# Patient Record
Sex: Male | Born: 1965 | Race: White | Hispanic: No | Marital: Married | Smoking: Never smoker
Health system: Southern US, Community
[De-identification: ages and names within clinical notes are randomized; demographics above are authoritative.]

## PROBLEM LIST (undated history)

## (undated) DIAGNOSIS — T7840XA Allergy, unspecified, initial encounter: Secondary | ICD-10-CM

## (undated) DIAGNOSIS — F32A Depression, unspecified: Secondary | ICD-10-CM

## (undated) DIAGNOSIS — F329 Major depressive disorder, single episode, unspecified: Secondary | ICD-10-CM

## (undated) DIAGNOSIS — F419 Anxiety disorder, unspecified: Secondary | ICD-10-CM

## (undated) HISTORY — DX: Depression, unspecified: F32.A

## (undated) HISTORY — DX: Allergy, unspecified, initial encounter: T78.40XA

## (undated) HISTORY — DX: Anxiety disorder, unspecified: F41.9

## (undated) HISTORY — DX: Major depressive disorder, single episode, unspecified: F32.9

---

## 2005-11-11 ENCOUNTER — Emergency Department (HOSPITAL_COMMUNITY): Admission: EM | Admit: 2005-11-11 | Discharge: 2005-11-11 | Payer: Self-pay | Admitting: Family Medicine

## 2012-07-24 ENCOUNTER — Other Ambulatory Visit (HOSPITAL_COMMUNITY): Payer: Self-pay | Admitting: Family Medicine

## 2012-07-24 ENCOUNTER — Ambulatory Visit (HOSPITAL_COMMUNITY)
Admission: RE | Admit: 2012-07-24 | Discharge: 2012-07-24 | Disposition: A | Discharge status: Home / Self Care | Payer: No Typology Code available for payment source | Source: Ambulatory Visit | Attending: Family Medicine | Admitting: Family Medicine

## 2012-07-24 DIAGNOSIS — M47812 Spondylosis without myelopathy or radiculopathy, cervical region: Secondary | ICD-10-CM | POA: Insufficient documentation

## 2012-07-24 DIAGNOSIS — M47817 Spondylosis without myelopathy or radiculopathy, lumbosacral region: Secondary | ICD-10-CM | POA: Insufficient documentation

## 2012-07-24 DIAGNOSIS — R52 Pain, unspecified: Secondary | ICD-10-CM

## 2014-05-11 ENCOUNTER — Ambulatory Visit (INDEPENDENT_AMBULATORY_CARE_PROVIDER_SITE_OTHER): Payer: BC Managed Care – PPO | Admitting: Family Medicine

## 2014-05-11 VITALS — BP 128/88 | HR 71 | Temp 98.1°F | Resp 16 | Ht 74.0 in | Wt 231.2 lb

## 2014-05-11 DIAGNOSIS — Z0000 Encounter for general adult medical examination without abnormal findings: Secondary | ICD-10-CM

## 2014-05-11 NOTE — Progress Notes (Signed)
      Chief Complaint:  Chief Complaint  Patient presents with  . Annual Exam    [employer] Physical    HPI: Jerry Thornton is a 48 y.o. male who is here for [employer] PE He is doing well without complaints Does not want any lab work  He denies CP, SOB, palpitations.   Past Medical History  Diagnosis Date  . Allergy    History reviewed. No pertinent past surgical history. History   Social History  . Marital Status: Married    Spouse Name: N/A    Number of Children: N/A  . Years of Education: N/A   Social History Main Topics  . Smoking status: Never Smoker   . Smokeless tobacco: Never Used  . Alcohol Use: Yes     Comment: 2 drinks/week  . Drug Use: No  . Sexual Activity: None   Other Topics Concern  . None   Social History Narrative  . None   Family History  Problem Relation Age of Onset  . Heart disease Paternal Grandfather   . Mental illness Brother    No Known Allergies Prior to Admission medications   Medication Sig Start Date End Date Taking? Authorizing Provider  Multiple Vitamins-Minerals (MULTIVITAMIN WITH MINERALS) tablet Take 1 tablet by mouth daily.   Yes Historical Provider, MD  Omega-3 Fatty Acids (FISH OIL PO) Take 1 capsule by mouth daily.   Yes Historical Provider, MD     ROS: The patient denies fevers, chills, night sweats, unintentional weight loss, chest pain, palpitations, wheezing, dyspnea on exertion, nausea, vomiting, abdominal pain, dysuria, hematuria, melena, numbness, weakness, or tingling.   All other systems have been reviewed and were otherwise negative with the exception of those mentioned in the HPI and as above.    PHYSICAL EXAM: Filed Vitals:   05/11/14 1135  BP: 128/88  Pulse: 71  Temp: 98.1 F (36.7 C)  Resp: 16   Filed Vitals:   05/11/14 1135  Height: 6\' 2"  (1.88 m)  Weight: 231 lb 4 oz (104.894 kg)   Body mass index is 29.68 kg/(m^2).  General: Alert, no acute distress HEENT:  Normocephalic, atraumatic,  oropharynx patent. EOMI, PERRLA, fundo exam normal Cardiovascular:  Regular rate and rhythm, no rubs murmurs or gallops.  No Carotid bruits, radial pulse intact. No pedal edema.  Respiratory: Clear to auscultation bilaterally.  No wheezes, rales, or rhonchi.  No cyanosis, no use of accessory musculature GI: No organomegaly, abdomen is soft and non-tender, positive bowel sounds.  No masses. Skin: No rashes. Neurologic: Facial musculature symmetric. Psychiatric: Patient is appropriate throughout our interaction. Lymphatic: No cervical lymphadenopathy Musculoskeletal: Gait intact. 5/5 str, 2/2 DTRs   LABS: No results found for this or any previous visit.   EKG/XRAY:   [facility]   ASSESSMENT/PLAN: Encounter Diagnosis  Name Primary?  . Annual physical exam Yes   No labs for annual, recently done somewhere else.  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 F/u prn  Gross sideeffects, risk and benefits, and alternatives of medications d/w patient. Patient is aware that all medications have potential sideeffects and we are unable to predict every sideeffect or drug-drug interaction that may occur.  Hamilton CapriLE, Starlena Beil PHUONG, DO 05/13/2014 4:47 PM

## 2015-06-09 ENCOUNTER — Ambulatory Visit (INDEPENDENT_AMBULATORY_CARE_PROVIDER_SITE_OTHER): Payer: BLUE CROSS/BLUE SHIELD | Admitting: Sports Medicine

## 2015-06-09 ENCOUNTER — Encounter: Payer: Self-pay | Admitting: Sports Medicine

## 2015-06-09 VITALS — BP 135/75 | HR 74 | Ht 74.0 in | Wt 215.0 lb

## 2015-06-09 DIAGNOSIS — M5010 Cervical disc disorder with radiculopathy, unspecified cervical region: Secondary | ICD-10-CM | POA: Diagnosis not present

## 2015-06-09 NOTE — Progress Notes (Signed)
Patient ID: Jerry Thornton, male   DOB: 1966/06/14, 49 y.o.   MRN: [id number]  HPI:  [name] is a 49 yo otherwise healthy man presenting with a 1 week history of sharp, shooting right arm pain worse with lateral rotation of the neck. He has accompanying paresthesias in the R volar aspect of the forearm that radiates to his 4th and 5th phalanges.  Pain/paresthesias are worse when lying flat on his back and relieved with standing. Sleep has been difficult due to discomfort. Ibuprofen/acetaminophen and heat/massage improve his symptoms, also noting that his symptoms have gotten better over the week-long period.  Raising his R arm also relieves his symptoms.  Endorses some weakness in the R forearm and grip strength of 4th and 5th phalanges.  Has had similar back pain before (x2), but not with parathesias/weakness.  Left hand dominant.  Denies recent trauma. Denies vision changes, bowel/bladder incontinence.   Also endorses lower back pain that he attributes to his posture, as his job is mostly sedentary.  Endorses paresthesias in feet bilaterally when lying flat on back and relieved with standing.   Filed Vitals:   06/09/15 0958  BP: 135/75  Pulse: 74   Ortho Exam /Physical Exam:  General: Well-appearing male, appears uncomfortable while sitting upright on exam table, often shifting weight/standing.  Cervical spine: Full ROM, guards with lateral rotation. No pinpoint tenderness over vertebrae. CN XI/XII intact. No reproducible pain on exam, negative Spurlings (although suspect would be positive with further provocation).   Thoracic spine: Tenderness to palpation over upper-thoracic spine over spinous process.  Extremities: Normal tone and bulk in upper and lower extremities. Upper: 5/5 strength of shoulders, biceps, wrist flexors/extenors, intrinsic hand muscles. Bicep, brachioradialis reflexes 2+ and symmetrical.  L tricep 2+ reflex, R tricep 1+.  Slightly diminished sensation to light touch on  4th and 5th finger or dorsal and palmar aspect.  No muscle wasting in hypothenar regions bilaterally. Lower: 5/5 strength in quadriceps, tibialis anterior, and gastrocnemis/soleus. 2+ and symmetrical reflexes in patellar and Achilles reflexes.  Assessment:   [name] is a 49 yo otherwise healthy man presenting with symptoms most consistent with right-sided C8-T1 radiculopathy, likely 2/2 disc herniation as supported by shoulder abduction relief test, Spurlings (did not do provacative testing due to patient's discomfort), improvement of symptoms with time, and referred pain to upper thoracic region. Other differential items considered were cervical spondylotic myelopathy (currently no imaging, but history of C5-C6 facet narrowing in 2013), discogenic pain (pain is axial, but does have radicular component).   Plan:  - Conservative therapy with NSAIDs - ibuprofen 600 mg TID with food. Provided patient with information for [facility], as we feel that PT would be beneficial to his current symptoms as well as preventative.  - Discussed with patient would forgo Xray/MRI at this point due to improvement in symptoms over the week, but would consider if symptoms acutely worsen or parasthesias/weakness in R ulnar distribution do not abate.  - Will follow-up in 2 weeks or in the interim if worsening of symptoms.  Patient seen and evaluated with the medical student. I agree with the above dictated note. Patient will be referred for physical therapy and follow-up with me in 2 weeks. If symptoms persist or worsen consider x-rays and MRI to rule out cervical disc herniation. Patient is to call me if pain worsens in the interim.

## 2015-06-24 ENCOUNTER — Ambulatory Visit (INDEPENDENT_AMBULATORY_CARE_PROVIDER_SITE_OTHER): Payer: BLUE CROSS/BLUE SHIELD | Admitting: Sports Medicine

## 2015-06-24 ENCOUNTER — Encounter: Payer: Self-pay | Admitting: Sports Medicine

## 2015-06-24 VITALS — BP 110/70 | Ht 74.0 in | Wt 210.0 lb

## 2015-06-24 DIAGNOSIS — M5010 Cervical disc disorder with radiculopathy, unspecified cervical region: Secondary | ICD-10-CM

## 2015-06-24 NOTE — Progress Notes (Signed)
Patient ID: Trever Streater, male   DOB: October 03, 1966, 49 y.o.   MRN: [id number]  HPI: Ples Trudel is a 49 year old male presenting for 2 week follow-up for left-sided C8-T1 radicular symptoms which have now resolved. He has had two PT appointments with [facility] at which he has been doing scapular stretches and other lower body strengthening.  Since starting PT, he has ~2 episodes of radicular symptoms, that he was able "to shake out" with resolution.  Endorses decreased sensation over the 4th and 5th digits on the L hand at baseline and unchanged from prior visit.  Denies weakness.  Denies neck pain.  Endorses minimal L trapezius stiffness after starting PT.  Only taking ibuprofen PRN.  L hand dominant.   Physical Exam:  General: Well-appearing middle-aged male sitting comfortably on exam table in no apparent distress.  Cervical spine: Full ROM. No pinpoint tenderness over vertebrae. Negative Spurlings.   Thoracic spine: Tenderness to palpation over upper-thoracic spine over spinous process.  Extremities: Normal tone and bulk in upper extremities. Tender to palpation in superior L trapezius with palpable spasm.  5/5 strength of shoulders, biceps, wrist flexors/extenors, intrinsic hand muscles. Bicep, brachioradialis, tricep reflexes 2+ and symmetrical. Slightly diminished sensation to light touch on 4th and 5th finger or dorsal and palmar aspect, but stable from prior visit. No muscle wasting in hypothenar regions bilaterally.  Assessment/Plan:  Mr. Meriwether is a 49 year old man presenting for 2 week follow-up for left-sided C8-T1 radiculopathy which has now resolved, with an exam only notable for spasm of the L superior trapezius and mildly diminished sensation in the C8/ulnar distribution (baseline).  His symptoms were thought to be 2/2 disc herniation as supported by his previously positive shoulder abduction relief test, Spurlings (negative today), and referred pain in upper thoracic region  (still present). His improvement with PT is reassuring and supportive of the above diagnostic suspicion.  Degenerative cervical changes could also be contributory, but have not obtained imaging.  - Will forgo further imaging at this point, as no red flags present; discussed to return if experiencing weakness or worsening paraesthesias. - Suspect his radiculopathy, recent PT activity, and posture are contributory to his trapezius spasm, discussed focused PT HEP - Continue PT - Ibuprofen PRN - Follow-up PRN  Fontaine No, MS4  Patient seen and evaluated with the above-named medical student. I agree with the plan of care. Patient seems to be improving with physical therapy. Therefore, I will hold on any further imaging at this point in time. He will wean from formal therapy to a home exercise program per the therapist's discretion. If symptoms return I would reconsider merits of imaging to rule out a cervical disc herniation. Follow-up as needed.

## 2016-04-26 ENCOUNTER — Ambulatory Visit (INDEPENDENT_AMBULATORY_CARE_PROVIDER_SITE_OTHER): Payer: BLUE CROSS/BLUE SHIELD | Admitting: Physician Assistant

## 2016-04-26 VITALS — BP 136/92 | HR 65 | Temp 98.5°F | Resp 17 | Ht 74.0 in | Wt 236.0 lb

## 2016-04-26 DIAGNOSIS — H60392 Other infective otitis externa, left ear: Secondary | ICD-10-CM | POA: Diagnosis not present

## 2016-04-26 MED ORDER — CIPROFLOXACIN-HYDROCORTISONE 0.2-1 % OT SUSP: 3.0000 [drp] | Freq: Two times a day (BID) | OTIC | Status: DC

## 2016-04-26 NOTE — Patient Instructions (Addendum)
     IF you received an x-ray today, you will receive an invoice from [facility]. Please contact [facility] at [phone] with questions or concerns regarding your invoice.   IF you received labwork today, you will receive an invoice from [facility]. Please contact [facility] at [phone] with questions or concerns regarding your invoice.   Our billing staff will not be able to assist you with questions regarding bills from these companies.  You will be contacted with the lab results as soon as they are available. The fastest way to get your results is to activate your My Chart account. Instructions are located on the last page of this paperwork. If you have not heard from us regarding the results in 2 weeks, please contact this office.     Otitis Externa Otitis externa is a germ infection in the outer ear. The outer ear is the area from the eardrum to the outside of the ear. Otitis externa is sometimes called "swimmer's ear." HOME CARE  Put drops in the ear as told by your doctor.  Only take medicine as told by your doctor.  If you have diabetes, your doctor may give you more directions. Follow your doctor's directions.  Keep all doctor visits as told. To avoid another infection:  Keep your ear dry. Use the corner of a towel to dry your ear after swimming or bathing.  Avoid scratching or putting things inside your ear.  Avoid swimming in lakes, dirty water, or pools that use a chemical called chlorine poorly.  You may use ear drops after swimming. Combine equal amounts of white vinegar and alcohol in a bottle. Put 3 or 4 drops in each ear. GET HELP IF:   You have a fever.  Your ear is still red, puffy (swollen), or painful after 3 days.  You still have yellowish-white fluid (pus) coming from the ear after 3 days.  Your redness, puffiness, or pain gets worse.  You have a really bad headache.  You have redness, puffiness,  pain, or tenderness behind your ear. MAKE SURE YOU:   Understand these instructions.  Will watch your condition.  Will get help right away if you are not doing well or get worse.   This information is not intended to replace advice given to you by your health care provider. Make sure you discuss any questions you have with your health care provider.   Document Released: 03/22/2008 Document Revised: 10/25/2014 Document Reviewed: 10/21/2011 Elsevier Interactive Patient Education 2016 Elsevier Inc.  

## 2016-04-27 ENCOUNTER — Telehealth: Payer: Self-pay

## 2016-04-27 DIAGNOSIS — H60392 Other infective otitis externa, left ear: Secondary | ICD-10-CM

## 2016-04-27 NOTE — Telephone Encounter (Signed)
Patient is requesting a generic ear drop.   $300 for the name brand  [facility] [address]

## 2016-04-30 MED ORDER — CIPROFLOXACIN-HYDROCORTISONE 0.2-1 % OT SUSP: 3.0000 [drp] | Freq: Two times a day (BID) | OTIC | Status: AC

## 2016-04-30 NOTE — Telephone Encounter (Signed)
[facility]  Please advise patient.  They should let me know soon, if this is too expensive for him, (or he should).

## 2016-05-05 NOTE — Telephone Encounter (Signed)
Called phar to make sure pt had been able to get this med. Pharm reported that the pt just paid for the first one sent in the evening of 7/11 so that he would have the medication to use.

## 2016-05-06 NOTE — Progress Notes (Signed)
[facility] [address], [place] [id number] [phone]  Date:  04/26/2016   Name:  Jerry Oharalexander Savas   DOB:  03/10/1966   MRN:  [id number]  PCP:  Sissy HoffSWAYNE,DAVID W, MD    History of Present Illness:  Jerry Thornton is a 50 y.o. male patient who [facility] of [facility] ear pain. -started 4 days ago with increasingly worsening symptoms.  No radiating pain.  No fever, nausea, or malaise.    There are no active problems to display for this patient.   Past Medical History  Diagnosis Date  . Allergy   . Depression     No past surgical history on file.  Social History  Substance Use Topics  . Smoking status: Never Smoker   . Smokeless tobacco: Never Used  . Alcohol Use: Yes     Comment: 2 drinks/week    Family History  Problem Relation Age of Onset  . Heart disease Paternal Grandfather   . Mental illness Brother     No Known Allergies  Medication list has been reviewed and updated.  Current Outpatient Prescriptions on File Prior to Visit  Medication Sig Dispense Refill  . ibuprofen (ADVIL,MOTRIN) 200 MG tablet Take 200 mg by mouth every 6 (six) hours as needed. Reported on 04/26/2016    . Multiple Vitamins-Minerals (MULTIVITAMIN WITH MINERALS) tablet Take 1 tablet by mouth daily. Reported on 04/26/2016    . Omega-3 Fatty Acids (FISH OIL PO) Take 1 capsule by mouth daily. Reported on 04/26/2016     No current facility-administered medications on file prior to visit.    ROS ROS otherwise unremarkable unless listed above.  Physical Examination: BP 136/92 mmHg  Pulse 65  Temp(Src) 98.5 F (36.9 C) (Oral)  Resp 17  Ht 6\' 2"  (1.88 m)  Wt 236 lb (107.049 kg)  BMI 30.29 kg/m2  SpO2 97% Ideal Body Weight: Weight in (lb) to have BMI = 25: 194.3  Physical Exam  Constitutional: He is oriented to person, place, and time. He appears well-developed and well-nourished. No distress.  HENT:  Head: Normocephalic and atraumatic.  Right Ear:  Tympanic membrane, external ear and ear canal normal.  Left Ear: Tympanic membrane and external ear normal.  Left with purulent drainage.  And edematous canal.  No erythema. Pain with pulling the left ear.    Eyes: Conjunctivae and EOM are normal. Pupils are equal, round, and reactive to light.  Cardiovascular: Normal rate.   Pulmonary/Chest: Effort normal. No respiratory distress.  Neurological: He is alert and oriented to person, place, and time.  Skin: Skin is warm and dry. He is not diaphoretic.  Psychiatric: He has a normal mood and affect. His behavior is normal.     Assessment and Plan: Jerry Oharalexander Balistreri is a 50 y.o. male who is here today for cc of left ear pain.  Otitis, externa, infective, left - Plan: DISCONTINUED: ciprofloxacin-hydrocortisone (CIPRO HC) otic suspension  Trena PlattStephanie Ulyana Pitones, PA-C [facility] 7/20/20175:56 PM

## 2016-07-14 DIAGNOSIS — Z23 Encounter for immunization: Secondary | ICD-10-CM | POA: Diagnosis not present

## 2016-08-23 DIAGNOSIS — M25511 Pain in right shoulder: Secondary | ICD-10-CM | POA: Diagnosis not present

## 2016-10-02 ENCOUNTER — Emergency Department (HOSPITAL_COMMUNITY)
Admission: EM | Admit: 2016-10-02 | Discharge: 2016-10-03 | Disposition: A | Discharge status: Home / Self Care | Payer: BLUE CROSS/BLUE SHIELD | Attending: Emergency Medicine | Admitting: Emergency Medicine

## 2016-10-02 ENCOUNTER — Encounter (HOSPITAL_COMMUNITY): Payer: Self-pay | Admitting: Emergency Medicine

## 2016-10-02 DIAGNOSIS — F329 Major depressive disorder, single episode, unspecified: Secondary | ICD-10-CM

## 2016-10-02 DIAGNOSIS — F339 Major depressive disorder, recurrent, unspecified: Secondary | ICD-10-CM

## 2016-10-02 DIAGNOSIS — Z79899 Other long term (current) drug therapy: Secondary | ICD-10-CM | POA: Insufficient documentation

## 2016-10-02 DIAGNOSIS — F411 Generalized anxiety disorder: Secondary | ICD-10-CM

## 2016-10-02 LAB — COMPREHENSIVE METABOLIC PANEL
ALK PHOS: 53 U/L (ref 38–126)
ALT: 42 U/L (ref 17–63)
AST: 34 U/L (ref 15–41)
Albumin: 5 g/dL (ref 3.5–5.0)
Anion gap: 11 (ref 5–15)
BILIRUBIN TOTAL: 1.1 mg/dL (ref 0.3–1.2)
BUN: 13 mg/dL (ref 6–20)
CALCIUM: 9.4 mg/dL (ref 8.9–10.3)
CO2: 25 mmol/L (ref 22–32)
Chloride: 103 mmol/L (ref 101–111)
Creatinine, Ser: 0.93 mg/dL (ref 0.61–1.24)
GFR calc Af Amer: >60 mL/min (ref >60)
Glucose, Bld: 92 mg/dL (ref 65–99)
POTASSIUM: 4.3 mmol/L (ref 3.5–5.1)
Sodium: 139 mmol/L (ref 135–145)
TOTAL PROTEIN: 7.8 g/dL (ref 6.5–8.1)

## 2016-10-02 LAB — CBC WITH DIFFERENTIAL/PLATELET
BASOS ABS: 0 10*3/uL (ref 0.0–0.1)
BASOS PCT: 0 %
EOS ABS: 0 10*3/uL (ref 0.0–0.7)
EOS PCT: 0 %
HEMATOCRIT: 43.4 % (ref 39.0–52.0)
Hemoglobin: 15.4 g/dL (ref 13.0–17.0)
Lymphocytes Relative: 28 %
Lymphs Abs: 2.5 10*3/uL (ref 0.7–4.0)
MCH: 29.8 pg (ref 26.0–34.0)
MCHC: 35.5 g/dL (ref 30.0–36.0)
MCV: 84.1 fL (ref 78.0–100.0)
MONO ABS: 0.5 10*3/uL (ref 0.1–1.0)
Monocytes Relative: 6 %
NEUTROS ABS: 5.8 10*3/uL (ref 1.7–7.7)
Neutrophils Relative %: 66 %
PLATELETS: 289 10*3/uL (ref 150–400)
RBC: 5.16 MIL/uL (ref 4.22–5.81)
RDW: 12.6 % (ref 11.5–15.5)
WBC: 8.8 10*3/uL (ref 4.0–10.5)

## 2016-10-02 LAB — RAPID URINE DRUG SCREEN, HOSP PERFORMED
AMPHETAMINES: NOT DETECTED
Barbiturates: NOT DETECTED
Benzodiazepines: NOT DETECTED
COCAINE: NOT DETECTED
OPIATES: NOT DETECTED
TETRAHYDROCANNABINOL: NOT DETECTED

## 2016-10-02 LAB — URINALYSIS, ROUTINE W REFLEX MICROSCOPIC
Bacteria, UA: NONE SEEN
Bilirubin Urine: NEGATIVE
Glucose, UA: NEGATIVE mg/dL
Hgb urine dipstick: NEGATIVE
Ketones, ur: 5 mg/dL — AB
Leukocytes, UA: NEGATIVE
Nitrite: NEGATIVE
Protein, ur: 30 mg/dL — AB
RBC / HPF: NONE SEEN RBC/hpf (ref 0–5)
SPECIFIC GRAVITY, URINE: 1.027 (ref 1.005–1.030)
Squamous Epithelial / LPF: NONE SEEN
pH: 7 (ref 5.0–8.0)

## 2016-10-02 LAB — ETHANOL

## 2016-10-02 LAB — ACETAMINOPHEN LEVEL

## 2016-10-02 LAB — SALICYLATE LEVEL

## 2016-10-02 MED ORDER — ZIPRASIDONE MESYLATE 20 MG IM SOLR
INTRAMUSCULAR | Status: AC
  Filled 2016-10-02: qty 20

## 2016-10-02 MED ORDER — ZIPRASIDONE MESYLATE 20 MG IM SOLR
10.0000 mg | Freq: Once | INTRAMUSCULAR | Status: AC
  Administered 2016-10-02: 10 mg via INTRAMUSCULAR

## 2016-10-02 MED ORDER — STERILE WATER FOR INJECTION IJ SOLN
INTRAMUSCULAR | Status: AC
  Filled 2016-10-02: qty 10

## 2016-10-02 MED ORDER — LORAZEPAM 2 MG/ML IJ SOLN
1.0000 mg | Freq: Once | INTRAMUSCULAR | Status: AC
  Administered 2016-10-02: 1 mg via INTRAMUSCULAR

## 2016-10-02 MED ORDER — LORAZEPAM 2 MG/ML IJ SOLN
INTRAMUSCULAR | Status: AC
  Filled 2016-10-02: qty 1

## 2016-10-02 NOTE — ED Provider Notes (Signed)
Patient signed out from off going emergency physician pending TTS evaluation for depression. Patient expressed no suicidality and relayed the patient was voluntary. TTS evaluated patient and recommended psychiatry evaluation in the morning. Around 7:30 PM patient decided that he was not willing to stay in the emergency department. Left the building and was noted by staff chasing nurses in the parking lot. Off duty police officer found the patient sitting in the road. Patient was attempting to go to police officers into shooting him in the head. When brought inside patient refused to go into his room. Stating, " let's do this and the open, right here" while pointing at his forehead. Patient appeared very agitated with pressured speech. IVC paperwork was initiated and chemical sedatives given.    Loren Raceravid Joshaua Epple, MD 10/02/16 [phone]

## 2016-10-02 NOTE — ED Notes (Signed)
Hourly rounding reveals patient sleeping in room. No complaints, stable, in no acute distress. Q15 minute rounds and monitoring via Security Cameras to continue. 

## 2016-10-02 NOTE — ED Notes (Signed)
Pt seen by security chasing off going nurses around the parking lot.  Pt was trying to get off duty officer to shoot him.  Dr. Ranae PalmsYelverton made aware and has taken out emergent IVC paperwork.

## 2016-10-02 NOTE — ED Notes (Signed)
Per Dr Fredderick PhenixBelfi - hold off on doing lab work.

## 2016-10-02 NOTE — BH Assessment (Addendum)
Rutha BouchardAC JoAnn, RN reports no appropriate OBS beds are available for the pt. Advised RN that the pt will need an AM psych.  Princess BruinsAquicha Duff, MSW, LCSW-A

## 2016-10-02 NOTE — BH Assessment (Signed)
Tele Assessment Note   Jerry Thornton is a 50 y.o. male who [facility] a [facility] complaint of depressive symptoms and rapid mood changes.  He was accompanied by his wife, who requested the assessment.  Pt's wife expressed concern that Pt is behaving strangely and may be heading toward a psychotic break.  Wife reported as follows:  She stated that 20 years ago, Pt experienced a brief psychotic episode for which he was hospitalized.  He had another 'episode' about 12 years ago.  At that time, he did not go to the hospital because he was under psychiatric care ([place]  Wife reported that over the last few days, Pt has 'not been himself' -- he sometimes cries, acts confused, forgets words, and then is calm/composed again.  Wife expressed concern because Pt's family has a history of depression and anxiety, and his brother committed suicide two years ago.  Wife said she suspects that Pt is stressed over work.  Pt himself denied any suicidal or homicidal ideation, auditory/visual hallucination, substance use, or self-injury.  Pt seemed very guarded and referred several questions such as "What do you want to see done today?" to wife.  Pt expressed a desire to go home, stating that he felt safe.  Wife stated that she feels 'mostly' safe with Pt at home, but she is concerned about his mood.   After assessing Pt, Thereasa Parkinauthor was advised that hospital staff believed Pt was paranoid because of strange comments he made during blood draw, to the effect of "You better get that blood."  Per wife, Pt is not under any psychiatric care currently.  Also per wife, [facility]  Pt was dressed in street clothes and appeared appropriately groomed.  He had fair eye contact -- he made eye contact with author, but stared at wife and referred questions to her.  Pt's mood was empty, and affect was flat.  Pt's demeanor was calm.  He denied  suicidal ideation, homicidal ideation, auditory/visual hallucination, and substance use.  He denied other depressive symptoms as well, although (per wife), he has episodes of sadness and tearfulness.  Pt's speech was spare, soft, and regular in rhythm and rate.  Thought processes were within normal range.  Thought content seemed logical and goal-oriented.  He responded to questions appropriately, often referring to wife.  Pt's memory and concentration were grossly intact.  Pt's insight, judgment, and impulse control were deemed fair.  Consulted with C Withrow, DNP, who determined that Pt does not meet inpatient criteria at this time.  Recommended that Pt [facility].  Advised attending physician, and recommend that if Pt's condition worsen or grow severe that Pt return to ED/[facility] for assessment.  Diagnosis: Major Depressive Disorder, Recurrent, Moderate  Past Medical History:  Past Medical History:  Diagnosis Date  . Allergy   . Depression     History reviewed. No pertinent surgical history.  Family History:  Family History  Problem Relation Age of Onset  . Heart disease Paternal Grandfather   . Mental illness Brother     Social History:  reports that he has never smoked. He has never used smokeless tobacco. He reports that he drinks alcohol. He reports that he does not use drugs.  Additional Social History:  Alcohol / Drug Use Pain Medications: See PTA Prescriptions: See PTA Over the Counter: See PTA History of alcohol / drug use?: No history  of alcohol / drug abuse  CIWA: CIWA-Ar BP: (!) 164/106 Pulse Rate: 120 COWS:    PATIENT STRENGTHS: (choose at least two) Average or above average intelligence Capable of independent living  Allergies: No Known Allergies  Home Medications:  (Not in a hospital admission)  OB/GYN Status:  No LMP for male patient.  General Assessment Data [facility] of Assessment: [facility] TTS Assessment: In system Is  this a Tele or Face-to-Face Assessment?: Tele Assessment Is this an Initial Assessment or a Re-assessment for this encounter?: Initial Assessment Marital status: Married Living Arrangements: Children, Spouse/significant other Can pt return to current living arrangement?: Yes Admission Status: Voluntary Is patient capable of signing voluntary admission?: Yes Referral Source: Self/Family/Friend Insurance type: BCBS     Crisis Care Plan Living Arrangements: Children, Spouse/significant other Name of Psychiatrist: None currently Name of Therapist: None currently  Education Status Is patient currently in school?: No  Risk to self with the past 6 months Suicidal Ideation: No Has patient been a risk to self within the past 6 months prior to admission? : No Suicidal Intent: No Has patient had any suicidal intent within the past 6 months prior to admission? : No Is patient at risk for suicide?: No Suicidal Plan?: No Has patient had any suicidal plan within the past 6 months prior to admission? : No Access to Means: No What has been your use of drugs/alcohol within the last 12 months?: Pt denied Previous Attempts/Gestures: No Intentional Self Injurious Behavior: None Family Suicide History: Yes (Brother committed suicide two years ago) Recent stressful life event(s): Other (Comment) (None indentified -- possibly work stressor) Persecutory voices/beliefs?: No Depression: Yes Depression Symptoms: Despondent, Isolating Substance abuse history and/or treatment for substance abuse?: No Suicide prevention information given to non-admitted patients: Not applicable  Risk to Others within the past 6 months Homicidal Ideation: No Does patient have any lifetime risk of violence toward others beyond the six months prior to admission? : No Thoughts of Harm to Others: No Current Homicidal Intent: No Current Homicidal Plan: No Access to Homicidal Means: No History of harm to others?:  No Assessment of Violence: None Noted Does patient have access to weapons?: No Criminal Charges Pending?: No Does patient have a court date: No Is patient on probation?: No  Psychosis Hallucinations: None noted Delusions: None noted (However, per hospital staff, Pt seemed somewhat paranoid)  Mental Status Report Appearance/Hygiene: Unremarkable, Other (Comment) (Street clothes) Eye Contact: Fair (Alternated b/n looking at Chartered loss adjuster and wife) Motor Activity: Unremarkable Speech: Logical/coherent, Other (Comment) (Short answers; very guarded demeanor) Level of Consciousness: Irritable Mood: Empty Affect: Flat Anxiety Level: None Thought Processes: Coherent, Relevant (Responded appropriately to questions posed) Judgement: Partial Orientation: Person, Place, Time, Situation Obsessive Compulsive Thoughts/Behaviors: None  Cognitive Functioning Concentration: Fair Memory: Recent Intact, Remote Intact IQ: Average Insight: Fair Impulse Control: Fair Appetite: Good Sleep: No Change Total Hours of Sleep: 8 Vegetative Symptoms: None  ADLScreening [facility]) Patient's cognitive ability adequate to safely complete daily activities?: Yes Patient able to express need for assistance with ADLs?: Yes Independently performs ADLs?: Yes (appropriate for developmental age)  Prior Inpatient Therapy Prior Inpatient Therapy: Yes Prior Therapy Dates: Approximately 1997 Prior Therapy Facilty/Provider(s): unknown Reason for Treatment: Brief psychotic disorder  Prior Outpatient Therapy Prior Outpatient Therapy: Yes Prior Therapy Dates: 2002 Prior Therapy Facilty/Provider(s): psychiatrist in [place] Reason for Treatment: Depressive symptoms Does patient have an ACCT team?: No Does patient have Intensive In-House Services?  : No Does patient have [facility] services? :  No Does patient have P4CC services?: No  ADL Screening (condition at time of admission) Patient's cognitive  ability adequate to safely complete daily activities?: Yes Is the patient deaf or have difficulty hearing?: No Does the patient have difficulty seeing, even when wearing glasses/contacts?: No Does the patient have difficulty concentrating, remembering, or making decisions?: No Patient able to express need for assistance with ADLs?: Yes Does the patient have difficulty dressing or bathing?: No Independently performs ADLs?: Yes (appropriate for developmental age) Does the patient have difficulty walking or climbing stairs?: No Weakness of Legs: None Weakness of Arms/Hands: None  Home Assistive Devices/Equipment Home Assistive Devices/Equipment: None  Therapy Consults (therapy consults require a physician order) PT Evaluation Needed: No OT Evalulation Needed: No SLP Evaluation Needed: No Abuse/Neglect Assessment (Assessment to be complete while patient is alone) Physical Abuse: Denies Verbal Abuse: Denies Sexual Abuse: Denies Exploitation of patient/patient's resources: Denies Self-Neglect: Denies Values / Beliefs Cultural Requests During Hospitalization: None Spiritual Requests During Hospitalization: None Consults Spiritual Care Consult Needed: No Social Work Consult Needed: No Merchant navy officerAdvance Directives (For Healthcare) Does Patient Have a Medical Advance Directive?: No    Additional Information 1:1 In Past 12 Months?: No CIRT Risk: No Elopement Risk: No Does patient have medical clearance?: Yes     Disposition:  Disposition Initial Assessment Completed for this Encounter: Yes Disposition of Patient: Referred to Patient referred to: Outpatient clinic referral ([facility] Pt has appointment coming Thursday)  Dorris Fetchugene T Jauan Wohl 10/02/2016 5:21 PM

## 2016-10-02 NOTE — ED Notes (Signed)
Pt. Transferred to [facility] from ED to room after screening for contraband. Report to include Situation, Background, Assessment and Recommendations from [facility] RN. Pt. Oriented to unit including Q15 minute rounds as well as the security cameras for their protection. Patient is alert and oriented, warm and dry in no acute distress. Patient denies SI, HI, and AVH. Pt. Encouraged to let me know if needs arise.

## 2016-10-02 NOTE — ED Notes (Addendum)
Attempted to collect labs-patient appears to be paranoid, guarded-blunted affect-needing reassurance from wife-denies Si/HI-MD aware

## 2016-10-02 NOTE — ED Notes (Signed)
Tele-psych at bedside.

## 2016-10-02 NOTE — ED Triage Notes (Addendum)
Pt presents with wife voluntarily to please wife. Pt not wanting to speak but is answering questions. Wife states that pt is not acting himself this past week. Wife says pt has had 2 mental break downs, one 20 years ago and another about 12 years ago and has been fine not needing any medications since. Pt denies feeling stressed or any SI/HI thoughts.

## 2016-10-02 NOTE — ED Provider Notes (Signed)
[facility]-EMERGENCY DEPT Provider Note   CSN: [id number] Arrival date & time: 10/02/16  1524     History   Chief Complaint Chief Complaint  Patient presents with  . Psychiatric Evaluation    HPI Jerry Thornton is a 50 y.o. male.  Patient is a 50 year old male with a history of depression who presents with worsening depression and bizarre behavior. His wife states over last 2 weeks she's had worsening depression, with crying fits and bizarre behavior. He has episodes where he is crying and can't organize his thoughts. His symptoms seem to be worsening over the last 2 days. He denies any suicidal ideations. He has a prior history of similar episodes in the past where he was hospitalized for his depression. He denies any recent illnesses. No current physical complaints. He does have a strong family history of depression and anxiety. His brother committed suicide 2 years ago.      Past Medical History:  Diagnosis Date  . Allergy   . Depression     There are no active problems to display for this patient.   History reviewed. No pertinent surgical history.     Home Medications    Prior to Admission medications   Medication Sig Start Date End Date Taking? Authorizing Provider  ibuprofen (ADVIL,MOTRIN) 200 MG tablet Take 200 mg by mouth every 6 (six) hours as needed. Reported on 04/26/2016    Historical Provider, MD  Multiple Vitamins-Minerals (MULTIVITAMIN WITH MINERALS) tablet Take 1 tablet by mouth daily. Reported on 04/26/2016    Historical Provider, MD  Omega-3 Fatty Acids (FISH OIL PO) Take 1 capsule by mouth daily. Reported on 04/26/2016    Historical Provider, MD    Family History Family History  Problem Relation Age of Onset  . Heart disease Paternal Grandfather   . Mental illness Brother     Social History Social History  Substance Use Topics  . Smoking status: Never Smoker  . Smokeless tobacco: Never Used  . Alcohol use Yes     Comment: 2 drinks/week      Allergies   Patient has no known allergies.   Review of Systems Review of Systems  Constitutional: Negative for chills, diaphoresis, fatigue and fever.  HENT: Negative for congestion, rhinorrhea and sneezing.   Eyes: Negative.   Respiratory: Negative for cough, chest tightness and shortness of breath.   Cardiovascular: Negative for chest pain and leg swelling.  Gastrointestinal: Negative for abdominal pain, blood in stool, diarrhea, nausea and vomiting.  Genitourinary: Negative for difficulty urinating, flank pain, frequency and hematuria.  Musculoskeletal: Negative for arthralgias and back pain.  Skin: Negative for rash.  Neurological: Negative for dizziness, speech difficulty, weakness, numbness and headaches.  Psychiatric/Behavioral: Positive for dysphoric mood. Negative for suicidal ideas. The patient is nervous/anxious.      Physical Exam Updated Vital Signs BP 114/69 (BP Location: Right Arm)   Pulse 94   Temp 98.4 F (36.9 C) (Oral)   Resp 18   Ht 6\' 2"  (1.88 m)   Wt 214 lb (97.1 kg)   SpO2 95%   BMI 27.48 kg/m   Physical Exam  Constitutional: He is oriented to person, place, and time. He appears well-developed and well-nourished.  HENT:  Head: Normocephalic and atraumatic.  Eyes: Pupils are equal, round, and reactive to light.  Neck: Normal range of motion. Neck supple.  Cardiovascular: Normal rate, regular rhythm and normal heart sounds.   Pulmonary/Chest: Effort normal and breath sounds normal. No respiratory distress. He has no wheezes.  He has no rales. He exhibits no tenderness.  Abdominal: Soft. Bowel sounds are normal. There is no tenderness. There is no rebound and no guarding.  Musculoskeletal: Normal range of motion. He exhibits no edema.  Lymphadenopathy:    He has no cervical adenopathy.  Neurological: He is alert and oriented to person, place, and time.  Motor 5/5 all extremities Sensation grossly intact to LT all extremities Finger to Nose  intact, no pronator drift CN II-XII grossly intact Gait normal   Skin: Skin is warm and dry. No rash noted.  Psychiatric: His affect is blunt.     ED Treatments / Results  Labs (all labs ordered are listed, but only abnormal results are displayed) Labs Reviewed  URINALYSIS, ROUTINE W REFLEX MICROSCOPIC - Abnormal; Notable for the following:       Result Value   Color, Urine AMBER (*)    APPearance HAZY (*)    Ketones, ur 5 (*)    Protein, ur 30 (*)    All other components within normal limits  ACETAMINOPHEN LEVEL - Abnormal; Notable for the following:    Acetaminophen (Tylenol), Serum <10 (*)    All other components within normal limits  RAPID URINE DRUG SCREEN, HOSP PERFORMED  COMPREHENSIVE METABOLIC PANEL  ETHANOL  CBC WITH DIFFERENTIAL/PLATELET  SALICYLATE LEVEL    EKG  EKG Interpretation None       Radiology No results found.  Procedures Procedures (including critical care time)  Medications Ordered in ED Medications  ziprasidone (GEODON) injection 10 mg (10 mg Intramuscular Given 10/02/16 1945)  LORazepam (ATIVAN) injection 1 mg (1 mg Intramuscular Given 10/02/16 1945)  LORazepam (ATIVAN) injection 1 mg (1 mg Intramuscular Given 10/03/16 0456)  ziprasidone (GEODON) injection 10 mg (10 mg Intramuscular Given 10/03/16 0457)  sterile water (preservative free) injection (10 mLs  Given 10/03/16 0456)     Initial Impression / Assessment and Plan / ED Course  I have reviewed the triage vital signs and the nursing notes.  Pertinent labs & imaging results that were available during my care of the patient were reviewed by me and considered in my medical decision making (see chart for details).  Clinical Course     PT had a telepsych Evaluation to felt that the patient did not meet criteria for inpatient admission. His wife is concerned about him being discharge. However he does have an appointment with behavioral health on Thursday. Currently there is a  [facility] counselor doing an in person assessment.  We'll await assessment.  Dr. Ranae PalmsYelverton to follow.    Final Clinical Impressions(s) / ED Diagnoses   Final diagnoses:  None    New Prescriptions New Prescriptions   No medications on file     Rolan BuccoMelanie Elva Breaker, MD 10/03/16 [id number]

## 2016-10-02 NOTE — ED Notes (Signed)
Pt stated "Im not going to stay in this mental institution, im putting my clothes on and walking out that front door". Pt asked to calm down, and advised to stay for further evaluation. Pt denied and escalated in aggression. Jerilee HohStacy West, RN spoke with MD and he is aware of situation. Currently no IVC papers are had on this patient.

## 2016-10-02 NOTE — BH Assessment (Signed)
Per Jerry Raceravid Yelverton, MD around 7:30 PM patient decided that he was not willing to stay in the emergency department. Left the building and was noted by staff that he was chasing nurses in the parking lot. Off duty police officer found the patient sitting in the road. Patient was attempting to get the police officers to shoot him in the head. When brought inside patient refused to go into his room. Stating, " let's do this and the open, right here" while pointing at his forehead. Pt is now IVC'd.   Princess BruinsAquicha Duff, MSW, Theresia MajorsLCSWA

## 2016-10-02 NOTE — ED Notes (Signed)
[facility] Expected date:  Expected time:  Means of arrival:  Comments: [id number]

## 2016-10-02 NOTE — BH Assessment (Signed)
Jerry Thornton is a 89 yr. male who presented to the ER with his wife. When asked why he was here he stated, "I'm here because my wife thinks I'm paranoid." Patient made little eye contact with this clinician, eyes were downcast through most of the interview.  He seemed confused at times, did not answer most of my questions and passively deferred to his wife throughout the interview. Wife reports patient is not taking a shower unless prompted, uncontrollably crying at times, not sleeping well, could not remember when he was last at work, wife reports he has not worked since Wednesday after leaving mid-day, possible thought blocking, very slow response or no response at all.  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   Wife states twenty years ago after his uncle's funeral in [name] patient became paranoid, was not sleeping and was hospitalized in [name] and then x4 hospitalizations in [name], until he was stabilized. The patient started treatment locally with Dr. [name].   Approximately 12 yrs. ago a similar episode took place during a stressful period at work. He is currently having a lot of stress at work again. He took on a case involving a friend. Wife expressed patient has been obsessing about work, Hospital doctormaking statement like "I might go to jail." In the past patient presented with similar symptoms which alarmed the patient wife. She express he was out of work for 2 months with each episode.  Patient currently denied any SI, HI or A/V. Denies any paranoia or delusions.   Patient is not being treated on an outpatient basis at this time. In the past he has seen [name] for both episodes. [name] was also treating the patient's brother for depression at one time. His brother committed suicide two years ago and as result he does not want to return to her care.  Also per wife, [facility]   [name], DNP agrees to  admit patient overnight for observation.

## 2016-10-03 ENCOUNTER — Encounter (HOSPITAL_COMMUNITY): Payer: Self-pay | Admitting: *Deleted

## 2016-10-03 ENCOUNTER — Inpatient Hospital Stay (HOSPITAL_COMMUNITY)
Admission: AD | Admit: 2016-10-03 | Discharge: 2016-10-16 | DRG: 885 | Disposition: A | Discharge status: Home / Self Care | Payer: BLUE CROSS/BLUE SHIELD | Attending: Psychiatry | Admitting: Psychiatry

## 2016-10-03 DIAGNOSIS — F339 Major depressive disorder, recurrent, unspecified: Secondary | ICD-10-CM | POA: Diagnosis not present

## 2016-10-03 DIAGNOSIS — F329 Major depressive disorder, single episode, unspecified: Secondary | ICD-10-CM | POA: Diagnosis not present

## 2016-10-03 DIAGNOSIS — F323 Major depressive disorder, single episode, severe with psychotic features: Principal | ICD-10-CM | POA: Diagnosis present

## 2016-10-03 DIAGNOSIS — Z818 Family history of other mental and behavioral disorders: Secondary | ICD-10-CM

## 2016-10-03 DIAGNOSIS — Z8249 Family history of ischemic heart disease and other diseases of the circulatory system: Secondary | ICD-10-CM

## 2016-10-03 DIAGNOSIS — F411 Generalized anxiety disorder: Secondary | ICD-10-CM | POA: Diagnosis present

## 2016-10-03 DIAGNOSIS — Z79899 Other long term (current) drug therapy: Secondary | ICD-10-CM | POA: Diagnosis not present

## 2016-10-03 MED ORDER — LORAZEPAM 2 MG/ML IJ SOLN
1.0000 mg | Freq: Once | INTRAMUSCULAR | Status: AC
  Administered 2016-10-03: 1 mg via INTRAMUSCULAR
  Filled 2016-10-03: qty 1

## 2016-10-03 MED ORDER — TRAZODONE HCL 100 MG PO TABS: 100.0000 mg | ORAL_TABLET | Freq: Every day | ORAL | Status: DC

## 2016-10-03 MED ORDER — LORAZEPAM 1 MG PO TABS: 1.0000 mg | ORAL_TABLET | Freq: Once | ORAL | Status: DC

## 2016-10-03 MED ORDER — ZIPRASIDONE MESYLATE 20 MG IM SOLR
10.0000 mg | Freq: Once | INTRAMUSCULAR | Status: AC
  Administered 2016-10-03: 10 mg via INTRAMUSCULAR
  Filled 2016-10-03: qty 20

## 2016-10-03 MED ORDER — ALUM & MAG HYDROXIDE-SIMETH 200-200-20 MG/5ML PO SUSP: 30.0000 mL | ORAL | Status: DC | PRN

## 2016-10-03 MED ORDER — TRAZODONE HCL 100 MG PO TABS
100.0000 mg | ORAL_TABLET | Freq: Every day | ORAL | Status: DC
  Filled 2016-10-03 (×3): qty 1

## 2016-10-03 MED ORDER — ACETAMINOPHEN 325 MG PO TABS: 650.0000 mg | ORAL_TABLET | Freq: Four times a day (QID) | ORAL | Status: DC | PRN

## 2016-10-03 MED ORDER — STERILE WATER FOR INJECTION IJ SOLN
INTRAMUSCULAR | Status: AC
  Administered 2016-10-03: 10 mL
  Filled 2016-10-03: qty 10

## 2016-10-03 MED ORDER — ESCITALOPRAM OXALATE 10 MG PO TABS
10.0000 mg | ORAL_TABLET | Freq: Every day | ORAL | Status: DC
  Administered 2016-10-03: 10 mg via ORAL
  Filled 2016-10-03: qty 1

## 2016-10-03 MED ORDER — ZIPRASIDONE MESYLATE 20 MG IM SOLR: 20.0000 mg | Freq: Once | INTRAMUSCULAR | Status: DC

## 2016-10-03 MED ORDER — MAGNESIUM HYDROXIDE 400 MG/5ML PO SUSP: 30.0000 mL | Freq: Every day | ORAL | Status: DC | PRN

## 2016-10-03 MED ORDER — ESCITALOPRAM OXALATE 10 MG PO TABS
10.0000 mg | ORAL_TABLET | Freq: Every day | ORAL | Status: DC
  Administered 2016-10-04 – 2016-10-07 (×4): 10 mg via ORAL
  Filled 2016-10-03 (×8): qty 1

## 2016-10-03 NOTE — ED Notes (Signed)
Pt. Again threw his scrubs in the hall again. Redirection ineffective.

## 2016-10-03 NOTE — ED Notes (Signed)
Patient removed his scrubs and threw them in the hall. Communicated to patient that he needs to keep his cloths on or keep his door closed. Redirection minimally effective.

## 2016-10-03 NOTE — ED Notes (Signed)
Pt calm and cooperative during shift. Had to be re-directed again at 1500 regarding coming ut of room with no scrubs on wearing only a blanket. Pt stated, One last time." Compliant with meds and treatment. Denies SI/HI or AVH.

## 2016-10-03 NOTE — ED Notes (Signed)
Pt. To nurses station asking if there was a window where he coulr "get some fresh air". Pt. Informed concerning unit lay out without windows. Pt. Back to his room to lay down.

## 2016-10-03 NOTE — ED Notes (Signed)
Pt. At nurses desk asking for his personal items. Jerry Thornton explained for the third or fourth time that belongings will be returned upon discharge.

## 2016-10-03 NOTE — ED Notes (Signed)
Pt. Up to nurses station asking for a different color scrubs. Pt. Informed as to scrubs utilized in [facility].

## 2016-10-03 NOTE — Progress Notes (Signed)
Patient ID: Jerry Thornton, male   DOB: 07-10-1966, 50 y.o.   MRN: [id number]    50 year old white male [facility] who reported that patient was acting bizarre at home. It was reported that patient had not been sleeping and that he was just not himself. It was reported that patient was IVC'ed because [facility] and that he told a cop to just kill him. [facility] he reported that [facility], and that he was admitted just to give his wife a break because she was stressed out. Pt reported that his wife is a physical therapist and her dad is a psychiatrist, and that it was no winning with them. Pt reported that he did not feel like he needed to be admitted and that he did not want to take any medication. Pt reported that he would go to group and he would engage in treatment, but medication he did not need. Pt reported that he had no medical problems and that he did not use any drugs or alcohol. Pt reported that his depression was a 0, his hopelessness was a 0, and his anxiety was a 0. Pt reported that he was negative SI/HI, no Ah/VH noted.

## 2016-10-03 NOTE — ED Notes (Signed)
Hourly rounding reveals patient sleeping in room. No complaints, stable, in no acute distress. Q15 minute rounds and monitoring via Security Cameras to continue. 

## 2016-10-03 NOTE — ED Notes (Signed)
Pt came out of room in his boxer shorts and no other clothes. Pt was easily re-directed back to his room by MHT. Stated that he was hot. Pt then came out of room asking MHT if he'd be able to go outside. MHT explained to pt that he would be able to go outside once he is discharged to a longer term facility. Pt was receptive to that answer and went back to his room and laid down.

## 2016-10-03 NOTE — ED Notes (Signed)
Pt. Refuses PO meds.

## 2016-10-03 NOTE — Tx Team (Signed)
Initial Treatment Plan 10/03/2016 5:38 PM Jerry OharaAlexander Barbaro [id number]    PATIENT STRESSORS: Marital or family conflict   PATIENT STRENGTHS: Ability for insight Average or above average intelligence   PATIENT IDENTIFIED PROBLEMS: "I don't know why I am here"    "I guess I am here because my wife is stressed out"    "My was concerned about my behavior"             DISCHARGE CRITERIA:  Improved stabilization in mood, thinking, and/or behavior Need for constant or close observation no longer present  PRELIMINARY DISCHARGE PLAN: Return to previous living arrangement  PATIENT/FAMILY INVOLVEMENT: This treatment plan has been presented to and reviewed with the patient, Jerry Thornton, and/or family member.  The patient and family have been given the opportunity to ask questions and make suggestions.  [name], [name], RN 10/03/2016, 5:38 PM

## 2016-10-03 NOTE — ED Notes (Signed)
Pt. To nurses station asking for breakfast. Pt. Informed of time and when breakfast arrives. Pt. Back to his room to lay down.

## 2016-10-03 NOTE — ED Notes (Signed)
[facility] without incident. Pt was accompanied by [employer] with [employer]ALA, personal belongings given to officer. Pt stable and denies SI/HI and AVH. [facility].

## 2016-10-04 MED ORDER — TRAZODONE HCL 50 MG PO TABS
50.0000 mg | ORAL_TABLET | Freq: Every evening | ORAL | Status: DC | PRN
  Administered 2016-10-04: 50 mg via ORAL
  Filled 2016-10-04: qty 1

## 2016-10-04 MED ORDER — ZIPRASIDONE MESYLATE 20 MG IM SOLR
INTRAMUSCULAR | Status: AC
  Filled 2016-10-04: qty 20

## 2016-10-04 MED ORDER — LORAZEPAM 0.5 MG PO TABS: 0.5000 mg | ORAL_TABLET | Freq: Four times a day (QID) | ORAL | Status: DC | PRN

## 2016-10-04 MED ORDER — LORAZEPAM 1 MG PO TABS
1.0000 mg | ORAL_TABLET | Freq: Four times a day (QID) | ORAL | Status: DC | PRN
  Administered 2016-10-04 – 2016-10-05 (×2): 1 mg via ORAL
  Filled 2016-10-04 (×2): qty 1

## 2016-10-04 MED ORDER — ARIPIPRAZOLE 5 MG PO TABS
5.0000 mg | ORAL_TABLET | Freq: Every day | ORAL | Status: DC
  Administered 2016-10-04 – 2016-10-05 (×2): 5 mg via ORAL
  Filled 2016-10-04 (×5): qty 1

## 2016-10-04 MED ORDER — LORAZEPAM 2 MG/ML IJ SOLN
1.0000 mg | Freq: Once | INTRAMUSCULAR | Status: AC
  Administered 2016-10-04: 1 mg via INTRAMUSCULAR

## 2016-10-04 MED ORDER — LORAZEPAM 2 MG/ML IJ SOLN: 1.0000 mg | Freq: Once | INTRAMUSCULAR | Status: DC

## 2016-10-04 MED ORDER — ZIPRASIDONE MESYLATE 20 MG IM SOLR
20.0000 mg | Freq: Once | INTRAMUSCULAR | Status: AC
  Administered 2016-10-04: 20 mg via INTRAMUSCULAR
  Filled 2016-10-04: qty 20

## 2016-10-04 MED ORDER — LORAZEPAM 2 MG/ML IJ SOLN
INTRAMUSCULAR | Status: AC
  Filled 2016-10-04: qty 1

## 2016-10-04 NOTE — Progress Notes (Signed)
Recreation Therapy Notes  Date: 10/04/16 Time: 1000 Location: 500 Hall Dayroom  Group Topic: Coping Skills  Goal Area(s) Addresses:  Patients will be able to identify the triggers for coping skills. Patients will be able to identify coping skills for triggers. Patients will be able to identify the benefits of using coping skills post d/c.   Behavioral Response: Observed  Intervention: Worksheet, pencils  Activity: Coping Skills Mindmap.  LRT assisted patients in filling out the center rectangle and eight (8) corresponding rectangles with triggers that initiate the need for coping skills.  Once the triggers were filled in, patients were to identify three (3) coping skills for each trigger.        Education:Coping Skills, Discharge Planning.   Education Outcome: Acknowledges understanding/In group clarification offered/Needs additional education.   Clinical Observations/Feedback: Pt arrived at the end of group.  Pt observed but was given a sheet to fill out.   Caroll RancherMarjette Miguelangel Korn, LRT/CTRS        Caroll RancherLindsay, [place] A 10/04/2016 12:23 PM

## 2016-10-04 NOTE — Progress Notes (Signed)
Noted exhibiting some paranoia behaviors, holding on to employee's RLE asking "please don't kill me." Employee implemented verbal de-escalating technique assuring the patient he was not going to hurt him. Staff assisted with using de-escalating techniques to get patient to release the employee's RLE. Patient release RLE, but managed to take hold of RLE again. Staff had to re-approach with de-escalating techniques, after release of RLE. Patient anxious/nervous and explaining he was scared. Patient later sat on the floor calmly. Staff was able to administered prn without any physical hold. Patient followed directions and sat on floor. He was offered breakfast, he looked at but refused. He later went into room to lay down, patient is resting quietly  In bed.

## 2016-10-04 NOTE — [facility] Suicide Risk Assessment (Addendum)
[facility] INPATIENT:  Family/Significant Other Suicide Prevention Education  Suicide Prevention Education:  Education Completed; No one has been identified by the patient as the family member/significant other with whom the patient will be residing, and identified as the person(s) who will aid the patient in the event of a mental health crisis (suicidal ideations/suicide attempt).  With written consent from the patient, the family member/significant other has been provided the following suicide prevention education, prior to the and/or following the discharge of the patient.  The suicide prevention education provided includes the following:  Suicide risk factors  Suicide prevention and interventions  National Suicide Hotline telephone number  [facility] assessment telephone number  [facility] Emergency Assistance 911  [facility] and/or Residential Mobile Crisis Unit telephone number  Request made of family/significant other to:  Remove weapons (e.g., guns, rifles, knives), all items previously/currently identified as safety concern.    Remove drugs/medications (over-the-counter, prescriptions, illicit drugs), all items previously/currently identified as a safety concern.  The family member/significant other verbalizes understanding of the suicide prevention education information provided.  The family member/significant other agrees to remove the items of safety concern listed above. The patient did not endorse SI at the time of admission, nor did the patient c/o SI during the stay here.  SPE not required.  However, I did talk to his wife, Jerry Thornton, [phone], and she assured me that she had gotten rid of all the guns in the house-gave them to a friend who took them to a gun collector.  She stated the inventory involved several shotguns. Baldo DaubRodney B [facility] 10/04/2016, 8:48 AM

## 2016-10-04 NOTE — [facility] Suicide Risk Assessment (Addendum)
[facility]   Nursing information obtained from:  Patient Demographic factors:  Male, Caucasian Current Mental Status:  NA Loss Factors:  NA Historical Factors:  NA Risk Reduction Factors:  Sense of responsibility to family  Total Time spent with patient: 45 minutes  Principal Problem: MDD (major depressive disorder), single episode, severe with psychotic features (HCC) Diagnosis:   Patient Active Problem List   Diagnosis Date Noted  . Major depressive disorder with current active episode [F32.9] 10/03/2016  . Generalized anxiety disorder [F41.1] 10/03/2016  . MDD (major depressive disorder), single episode, severe with psychotic features (HCC) [F32.3] 10/03/2016     Continued Clinical Symptoms:  Alcohol Use Disorder Identification Test Final Score (AUDIT): 0 The "Alcohol Use Disorders Identification Test", Guidelines for Use in Primary Care, Second Edition.  World Science writerHealth Organization [facility](WHO). Score between 0-7:  no or low risk or alcohol related problems. Score between 8-15:  moderate risk of alcohol related problems. Score between 16-19:  high risk of alcohol related problems. Score 20 or above:  warrants further diagnostic evaluation for alcohol dependence and treatment.   CLINICAL FACTORS:  50 year old married male. He lives with wife and two children, is an Pensions consultantattorney. Patient is a vague historian and states he does not really understand why he was admitted, but states that his wife and father in law were very concerned about him recently " and I trust them, so I went along with them bringing me to the hospital". He does state he has had some depression, crying at times with no particular reason, but denies suicidal ideations . He denies anhedonia, and denies major changes in sleep, appetite or energy level. Denies hallucinations, and does not appear internally preoccupied, but does state he has been feeling vaguely paranoid, but cannot describe further.  Does not express any delusions at this time. He does report he had a fear that his wife and family were going to intern him in a psychiatric hospital " for ever", but states he now  realizes this is not the case. Staff reports patient seemed fearful, paranoid earlier, asking RN not to be killed .   As per chart notes, his wife reports recent worsening depression, crying episodes, and disorganized thoughts/ behaviors, neglecting ADLs. Chart notes indicate patient  presented with slow, soft speech, possible thought blocking.  Patient has a prior psychiatric history and has had prior psychiatric admissions " for depression", none recently, last time about 12 years ago. Denies any history of suicidal attempts, denies history of mania, denies history of violence . Does not endorse history of psychosis. Denies any history of alcohol or drug abuse .  Denies medical illnesses  (+) family psychiatric history -brother had history of depression and committed suicide He was not taking any psychiatric medications prior to admission  Dx- MDD, with psychotic features   Plan-  patient was started on Lexapro, denies medication side effects thus far. Continue Lexapro 10 mgrs QDAY . Start Ativan 0.5 mgr Q 6 hours PRN for anxiety. Start Abilify 5 mgrs QDAY for depression /psychosis   Musculoskeletal: Strength & Muscle Tone: within normal limits Gait & Station: normal Patient leans: N/A  Psychiatric Specialty Exam: Physical Exam  ROS  Blood pressure 137/89, pulse (!) 102, temperature 98.6 F (37 C), temperature source Oral, resp. rate 20, height 6\' 2"  (1.88 m), weight 215 lb (97.5 kg).Body mass index is 27.6 kg/m.  General Appearance: Fairly Groomed  Eye Contact:  Good  Speech:  Slow  Volume:  Decreased  Mood:  Anxious and Depressed  Affect:  vaguely anxious, but reactive   Thought Process:  Linear- no thought blocking noted   Orientation:  Other:  alert, attentive  Thought Content:  denies  hallucinations, no delusions expressed, does not appear internally preoccupied  Suicidal Thoughts:  No denies suicidal or self injurious ideations , contracts for safety on the unit   Homicidal Thoughts:  No denies any homicidal or violent ideations  Memory:  recent and remote grossly intact   Judgement:  Fair  Insight:  Fair  Psychomotor Activity:  Decreased  Concentration:  Concentration: Good and Attention Span: Good  Recall:  Good  Fund of Knowledge:  Fair  Language:  Good  Akathisia:  Negative  Handed:  Right  AIMS (if indicated):     Assets:  Desire for Improvement Resilience  ADL's:  Intact  Cognition:  WNL  Sleep:  Number of Hours: 3.5      COGNITIVE FEATURES THAT CONTRIBUTE TO RISK:  Closed-mindedness and Loss of executive function    SUICIDE RISK:   Moderate:  Frequent suicidal ideation with limited intensity, and duration, some specificity in terms of plans, no associated intent, good self-control, limited dysphoria/symptomatology, some risk factors present, and identifiable protective factors, including available and accessible social support.   PLAN OF CARE: Patient will be admitted to inpatient psychiatric unit for stabilization and safety. Will provide and encourage milieu participation. Provide medication management and maked adjustments as needed.  Will follow daily.    I certify that inpatient services furnished can reasonably be expected to improve the patient's condition.  Nehemiah MassedOBOS, FERNANDO, MD 10/04/2016, 12:57 PM

## 2016-10-04 NOTE — Progress Notes (Signed)
Jerry Thornton was up this morning, initially went to breakfast in cafeteria but returned shortly thereafter and spoke about how he needed to return and speak to this Clinical research associatewriter about a job that we needed to give to him. He spoke of this for a few minutes before going back to his room. He then returned and spoke about how he has been having various stressors and spoke about how he responds to these stressors and spoke at length about how his mind works and perceives these stressors. This Clinical research associatewriter was getting up from desk to go into report room when [place] asked to speak to this Clinical research associatewriter in his room. This Clinical research associatewriter obliged and upon entering his room, Trinna Postlex did shut door and ask that we sit and talk. [name] continued speaking about these stressors and about how he was confused and became tearful during the conversation. [name] then stated that he wanted to make sure that he was talking to a [place] government man. Trinna Postlex was informed that he was not speaking with a [place] government man and then questioned this Clinical research associatewriter and stated that he thought he could trust me because he thought I was a [place] government man. He then proceeded to sit down in front of bedroom door and stated that he could not let me leave because he knows that I am trying to kill him. Staff tried to reassure Trinna Postlex he was in a safe place but he kept reiterating that I was going to try to kill him and would not get up from in front of door. This Clinical research associatewriter then pushed door open to the outside hall (the bedroom door can swing in towards the bedroom as well as out towards the hall). After the door opened, and this writer attempted to exit [name] wrapped his arms around leg and started to scream "Please don't kill me" over and over again. [name] refused to let go and required intervention of other staff members for him to finally let go.

## 2016-10-04 NOTE — Progress Notes (Signed)
D: Writer overheard pt yelling, "don't kill me". Writer and other staff walked to pt's room, where the door was open out into the hall, rather than opening into the room. Writer observed the pt laying on his stomach and holding the staff RN by his leg.  RN was attempting to get away from the pt, by walking. However, pt wouldn't let go of the RN's leg. Writer and another staff loosened one of the pt's arms, while the RN loosened the other and stepped away. Pt continued to yell, and crawled on his belly to grab the RN's leg again.  During the incident writer contacted the extender and received an order for 20 mg IM of geodon, and 1 mg of ativan IM. Pt was given injs in both arms at the entrance to 500 hall. Pt refused to move away from the door, and sat there several mins after injections were given. Once up pt moved and sat in front of the nsg station, in front of the med room door.  A:  Support and encouragement was offered. 15 min checks continued for safety.  R: Pt remains safe.

## 2016-10-04 NOTE — H&P (Signed)
Psychiatric Admission Assessment Adult  Patient Identification: Jerry Thornton MRN:  [id number] Date of Evaluation:  10/04/2016 Chief Complaint:  MDD with psychotic features Principal Diagnosis: MDD (major depressive disorder), single episode, severe with psychotic features [facility]) Diagnosis:   Patient Active Problem List   Diagnosis Date Noted  . Major depressive disorder with current active episode [F32.9] 10/03/2016  . Generalized anxiety disorder [F41.1] 10/03/2016  . MDD (major depressive disorder), single episode, severe with psychotic features ([place]) [F32.3] 10/03/2016   History of Present Illness:Per Tele- assessment note-Jerry Thornton is a 50 y.o. male who presented on a voluntary basis to [facility] with complaint of depressive symptoms and rapid mood changes.  He was accompanied by his wife, who requested the assessment.  Pt's wife expressed concern that Pt is behaving strangely and may be heading toward a psychotic break.  Wife reported as follows:  She stated that 20 years ago, Pt experienced a brief psychotic episode for which he was hospitalized.  He had another 'episode' about 12 years ago.  At that time, he did not go to the hospital because he was under psychiatric care (psychiatrist in [place]).  Wife reported that over the last few days, Pt has 'not been himself' -- he sometimes cries, acts confused, forgets words, and then is calm/composed again.  Wife expressed concern because Pt's family has a history of depression and anxiety, and his brother committed suicide two years ago.  Wife said she suspects that Pt is stressed over work.  Pt himself denied any suicidal or homicidal ideation, auditory/visual hallucination, substance use, or self-injury.  Pt seemed very guarded and referred several questions such as "What do you want to see done today?" to wife.  Pt expressed a desire to go home, stating that he felt safe.  Wife stated that she feels 'mostly' safe with Pt at home, but she  is concerned about his mood.   After assessing Pt, Pryor Curia was advised that hospital staff believed Pt was paranoid because of strange comments he made during blood draw, to the effect of "You better get that blood."  Per wife, Pt is not under any psychiatric care currently.  Also per wife, he has an appointment with [facility] outpatient this coming Thursday.  Pt was dressed in street clothes and appeared appropriately groomed.  He had fair eye contact -- he made eye contact with author, but stared at wife and referred questions to her.  Pt's mood was empty, and affect was flat.  Pt's demeanor was calm.  He denied suicidal ideation, homicidal ideation, auditory/visual hallucination, and substance use.  He denied other depressive symptoms as well, although (per wife), he has episodes of sadness and tearfulness.  Pt's speech was spare, soft, and regular in rhythm and rate.  Thought processes were within normal range.  Thought content seemed logical and goal-oriented.  He responded to questions appropriately, often referring to wife.  Pt's memory and concentration were grossly intact.  Pt's insight, judgment, and impulse control were deemed fair.  Consulted with C Withrow, DNP, who determined that Pt does not meet inpatient criteria at this time.  Recommended that Pt keep his Thursday appointment at [facility].  Advised attending physician, and recommend that if Pt's condition worsen or grow severe that Pt return to ED/[facility] for assessment.   Associated Signs/Symptoms: Depression Symptoms:  depressed mood, hopelessness, (Hypo) Manic Symptoms:  Irritable Mood, Labiality of Mood, Anxiety Symptoms:  Excessive Worry, Psychotic Symptoms:  Paranoia, PTSD Symptoms: Avoidance:  Decreased Interest/Participation Foreshortened Future  Total Time spent with patient: 45 minutes  Past Psychiatric History:   Is the patient at risk to self? Yes.    Has the patient been a risk to self in the past 6 months? Yes.     Has the patient been a risk to self within the distant past? Yes.    Is the patient a risk to others? No.  Has the patient been a risk to others in the past 6 months? No.  Has the patient been a risk to others within the distant past? No.   Prior Inpatient Therapy:   Prior Outpatient Therapy:    Alcohol Screening: 1. How often do you have a drink containing alcohol?: Never 9. Have you or someone else been injured as a result of your drinking?: No 10. Has a relative or friend or a doctor or another health worker been concerned about your drinking or suggested you cut down?: No Alcohol Use Disorder Identification Test Final Score (AUDIT): 0 Brief Intervention: AUDIT score less than 7 or less-screening does not suggest unhealthy drinking-brief intervention not indicated Substance Abuse History in the last 12 months:  No. Consequences of Substance Abuse: NA Previous Psychotropic Medications: YES Psychological Evaluations: YES Past Medical History:  Past Medical History:  Diagnosis Date  . Allergy   . Depression    History reviewed. No pertinent surgical history. Family History:  Family History  Problem Relation Age of Onset  . Heart disease Paternal Grandfather   . Mental illness Brother    Family Psychiatric  History:  Tobacco Screening: Have you used any form of tobacco in the last 30 days? (Cigarettes, Smokeless Tobacco, Cigars, and/or Pipes): No Social History:  History  Alcohol Use  . Yes    Comment: 2 drinks/week     History  Drug Use No    Additional Social History:      Pain Medications: none Prescriptions: none Over the Counter: none History of alcohol / drug use?: No history of alcohol / drug abuse                    Allergies:  No Known Allergies Lab Results:  Results for orders placed or performed during the hospital encounter of 10/02/16 (from the past 48 hour(s))  Urinalysis, Routine w reflex microscopic     Status: Abnormal   Collection Time:  10/02/16  4:15 PM  Result Value Ref Range   Color, Urine AMBER (A) YELLOW    Comment: BIOCHEMICALS MAY BE AFFECTED BY COLOR   APPearance HAZY (A) CLEAR   Specific Gravity, Urine 1.027 1.005 - 1.030   pH 7.0 5.0 - 8.0   Glucose, UA NEGATIVE NEGATIVE mg/dL   Hgb urine dipstick NEGATIVE NEGATIVE   Bilirubin Urine NEGATIVE NEGATIVE   Ketones, ur 5 (A) NEGATIVE mg/dL   Protein, ur 30 (A) NEGATIVE mg/dL   Nitrite NEGATIVE NEGATIVE   Leukocytes, UA NEGATIVE NEGATIVE   RBC / HPF NONE SEEN 0 - 5 RBC/hpf   WBC, UA 0-5 0 - 5 WBC/hpf   Bacteria, UA NONE SEEN NONE SEEN   Squamous Epithelial / LPF NONE SEEN NONE SEEN   Mucous PRESENT    Hyaline Casts, UA PRESENT    Ca Oxalate Crys, UA PRESENT   Rapid urine drug screen (hospital performed)     Status: None   Collection Time: 10/02/16  4:15 PM  Result Value Ref Range   Opiates NONE DETECTED NONE DETECTED   Cocaine NONE DETECTED NONE DETECTED  Benzodiazepines NONE DETECTED NONE DETECTED   Amphetamines NONE DETECTED NONE DETECTED   Tetrahydrocannabinol NONE DETECTED NONE DETECTED   Barbiturates NONE DETECTED NONE DETECTED    Comment:        DRUG SCREEN FOR MEDICAL PURPOSES ONLY.  IF CONFIRMATION IS NEEDED FOR ANY PURPOSE, NOTIFY LAB WITHIN 5 DAYS.        LOWEST DETECTABLE LIMITS FOR URINE DRUG SCREEN Drug Class       Cutoff (ng/mL) Amphetamine      1000 Barbiturate      200 Benzodiazepine   799 Tricyclics       872 Opiates          300 Cocaine          300 THC              50   Comprehensive metabolic panel     Status: None   Collection Time: 10/02/16  6:35 PM  Result Value Ref Range   Sodium 139 135 - 145 mmol/L   Potassium 4.3 3.5 - 5.1 mmol/L   Chloride 103 101 - 111 mmol/L   CO2 25 22 - 32 mmol/L   Glucose, Bld 92 65 - 99 mg/dL   BUN 13 6 - 20 mg/dL   Creatinine, Ser 0.93 0.61 - 1.24 mg/dL   Calcium 9.4 8.9 - 10.3 mg/dL   Total Protein 7.8 6.5 - 8.1 g/dL   Albumin 5.0 3.5 - 5.0 g/dL   AST 34 15 - 41 U/L   ALT 42 17 -  63 U/L   Alkaline Phosphatase 53 38 - 126 U/L   Total Bilirubin 1.1 0.3 - 1.2 mg/dL   GFR calc non Af Amer >60 >60 mL/min   GFR calc Af Amer >60 >60 mL/min    Comment: (NOTE) The eGFR has been calculated using the CKD EPI equation. This calculation has not been validated in all clinical situations. eGFR's persistently <60 mL/min signify possible Chronic Kidney Disease.    Anion gap 11 5 - 15  Ethanol     Status: None   Collection Time: 10/02/16  6:35 PM  Result Value Ref Range   Alcohol, Ethyl (B) <5 <5 mg/dL    Comment:        LOWEST DETECTABLE LIMIT FOR SERUM ALCOHOL IS 5 mg/dL FOR MEDICAL PURPOSES ONLY   CBC with Diff     Status: None   Collection Time: 10/02/16  6:35 PM  Result Value Ref Range   WBC 8.8 4.0 - 10.5 K/uL   RBC 5.16 4.22 - 5.81 MIL/uL   Hemoglobin 15.4 13.0 - 17.0 g/dL   HCT 43.4 39.0 - 52.0 %   MCV 84.1 78.0 - 100.0 fL   MCH 29.8 26.0 - 34.0 pg   MCHC 35.5 30.0 - 36.0 g/dL   RDW 12.6 11.5 - 15.5 %   Platelets 289 150 - 400 K/uL   Neutrophils Relative % 66 %   Neutro Abs 5.8 1.7 - 7.7 K/uL   Lymphocytes Relative 28 %   Lymphs Abs 2.5 0.7 - 4.0 K/uL   Monocytes Relative 6 %   Monocytes Absolute 0.5 0.1 - 1.0 K/uL   Eosinophils Relative 0 %   Eosinophils Absolute 0.0 0.0 - 0.7 K/uL   Basophils Relative 0 %   Basophils Absolute 0.0 0.0 - 0.1 K/uL  Salicylate level     Status: None   Collection Time: 10/02/16  6:35 PM  Result Value Ref Range   Salicylate Lvl <1.5 2.8 -  30.0 mg/dL  Acetaminophen level     Status: Abnormal   Collection Time: 10/02/16  6:35 PM  Result Value Ref Range   Acetaminophen (Tylenol), Serum <10 (L) 10 - 30 ug/mL    Comment:        THERAPEUTIC CONCENTRATIONS VARY SIGNIFICANTLY. A RANGE OF 10-30 ug/mL MAY BE AN EFFECTIVE CONCENTRATION FOR MANY PATIENTS. HOWEVER, SOME ARE BEST TREATED AT CONCENTRATIONS OUTSIDE THIS RANGE. ACETAMINOPHEN CONCENTRATIONS >150 ug/mL AT 4 HOURS AFTER INGESTION AND >50 ug/mL AT 12 HOURS AFTER  INGESTION ARE OFTEN ASSOCIATED WITH TOXIC REACTIONS.     Blood Alcohol level:  Lab Results  Component Value Date   ETH <5 76/22/6333    Metabolic Disorder Labs:  No results found for: HGBA1C, MPG No results found for: PROLACTIN No results found for: CHOL, TRIG, HDL, CHOLHDL, VLDL, LDLCALC  Current Medications: Current Facility-Administered Medications  Medication Dose Route Frequency Provider Last Rate Last Dose  . acetaminophen (TYLENOL) tablet 650 mg  650 mg Oral Q6H PRN Benjamine Mola, FNP      . alum & mag hydroxide-simeth (MAALOX/MYLANTA) 200-200-20 MG/5ML suspension 30 mL  30 mL Oral Q4H PRN Benjamine Mola, FNP      . escitalopram (LEXAPRO) tablet 10 mg  10 mg Oral Daily John C Withrow, FNP      . magnesium hydroxide (MILK OF MAGNESIA) suspension 30 mL  30 mL Oral Daily PRN Benjamine Mola, FNP      . traZODone (DESYREL) tablet 100 mg  100 mg Oral QHS Benjamine Mola, FNP       PTA Medications: Prescriptions Prior to Admission  Medication Sig Dispense Refill Last Dose  . ibuprofen (ADVIL,MOTRIN) 200 MG tablet Take 200 mg by mouth every 6 (six) hours as needed. Reported on 04/26/2016   Past Week at Unknown time  . Multiple Vitamins-Minerals (MULTIVITAMIN WITH MINERALS) tablet Take 1 tablet by mouth daily. Reported on 04/26/2016   Past Week at Unknown time  . Omega-3 Fatty Acids (FISH OIL PO) Take 1 capsule by mouth daily. Reported on 04/26/2016   Past Week at Unknown time    Musculoskeletal: Strength & Muscle Tone: within normal limits Gait & Station: normal Patient leans: N/A  Psychiatric Specialty Exam: Physical Exam  Nursing note and vitals reviewed. Constitutional: He is oriented to person, place, and time.  Neurological: He is alert and oriented to person, place, and time.  Psychiatric: He has a normal mood and affect. His behavior is normal.    Review of Systems  Psychiatric/Behavioral: Negative for hallucinations and suicidal ideas.    Blood pressure 137/89,  pulse (!) 102, temperature 98.6 F (37 C), temperature source Oral, resp. rate 20, height '6\' 2"'  (1.88 m), weight 97.5 kg (215 lb).Body mass index is 27.6 kg/m.  General Appearance: Guarded  Eye Contact:  Poor  Speech:  NA  Volume:  Decreased  Mood:  Depressed, Hopeless and Irritable  Affect:  Blunt and Depressed  Thought Process:  NA and Descriptions of Associations: Intact  Orientation:  Other:  UTA  Thought Content:  NA  Suicidal Thoughts:  per consult note, pt was asking [employer] to "shoot me" bazaar behavior noted in the ED  Homicidal Thoughts:  No  Memory:  Negative  Judgement:  Poor  Insight:  Lacking  Psychomotor Activity:  Restlessness  Concentration:  Concentration: Poor  Recall:  Poor  Fund of Knowledge:  Poor  Language:  NA  Akathisia:  No  Handed:  Right  AIMS (if indicated):  Assets:  Communication Skills Desire for Improvement Resilience Social Support  ADL's:  Intact  Cognition:  WNL  Sleep:  Number of Hours: 3.5     I agree with current treatment plan on 10/04/2016, Patient seen face-to-face for psychiatric evaluation follow-up, chart reviewed and case discussed with the MD Lowanda Cashaw, Advanced Practice Provider  C. Withrhrow. Reviewed the information documented and agree with the treatment plan.  Treatment Plan Summary: Daily contact with patient to assess and evaluate symptoms and progress in treatment and Medication management   Started Abilify 5 mg PO for mood stabilization. Continue with Trazodone 50 mg for insomnia  Will continue to monitor vitals ,medication compliance and treatment side effects while patient is here.  Reviewed labs  ,BAL - , UDS -  Orders pending for EKG, TSH, prolactin, Lipid and A1c CSW will start working on disposition.  Patient to participate in therapeutic milieu  Observation Level/Precautions:  15 minute checks  Laboratory:  CBC Chemistry Profile UDS UA  Psychotherapy:  individual and group session  Medications:  See  above  Consultations:  Psychiatry  Discharge Concerns: Safety, stabilization, and risk of access to medication and medication stabilization    Estimated LOS: 5-7days  Other:     Physician Treatment Plan for Primary Diagnosis: MDD (major depressive disorder), single episode, severe with psychotic features ([place]) Long Term Goal(s): Improvement in symptoms so as ready for discharge  Short Term Goals: Ability to identify changes in lifestyle to reduce recurrence of condition will improve, Ability to disclose and discuss suicidal ideas and Ability to identify and develop effective coping behaviors will improve  Physician Treatment Plan for Secondary Diagnosis: Principal Problem:   MDD (major depressive disorder), single episode, severe with psychotic features ([place])  Long Term Goal(s): Improvement in symptoms so as ready for discharge  Short Term Goals: Ability to disclose and discuss suicidal ideas, Ability to identify and develop effective coping behaviors will improve and Compliance with prescribed medications will improve  I certify that inpatient services furnished can reasonably be expected to improve the patient's condition.    Derrill Center, NP 12/18/201711:32 AM   Case reviewed with NP and patient seen by me Agree with NP assessment as above 86 year old married male. 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 Patient is a vague historian and states he does not really understand why he was admitted, but states that his wife and father in law were very concerned about him recently " and I trust them, so I went along with them bringing me to the hospital". He does state he has had some depression, crying at times with no particular reason, but denies suicidal ideations . He denies anhedonia, and denies major changes in sleep, appetite or energy level. Denies hallucinations, and does not appear internally preoccupied, but does state he has been feeling vaguely paranoid, but cannot  describe further. Does not express any delusions at this time. He does report he had a fear that his wife and family were going to intern him in a psychiatric hospital " for ever", but states he now  realizes this is not the case. Staff reports patient seemed fearful, paranoid earlier, asking RN not to be killed .   As per chart notes, his wife reports recent worsening depression, crying episodes, and disorganized thoughts/ behaviors, neglecting ADLs. Chart notes indicate patient  presented with slow, soft speech, possible thought blocking.  Patient has a prior psychiatric history and has had prior psychiatric admissions "  for depression", none recently, last time about 12 years ago. Denies any history of suicidal attempts, denies history of mania, denies history of violence . Does not endorse history of psychosis. Denies any history of alcohol or drug abuse .  Denies medical illnesses  (+) family psychiatric history -brother had history of depression and committed suicide He was not taking any psychiatric medications prior to admission  Dx- MDD, with psychotic features   Plan-  patient was started on Lexapro, denies medication side effects thus far. Continue Lexapro 10 mgrs QDAY . Start Ativan 0.5 mgr Q 6 hours PRN for anxiety. Start Abilify 5 mgrs QDAY for depression /psychosis

## 2016-10-04 NOTE — Progress Notes (Signed)
  D: Pt was irritable and anxious while talking to the writer. Pt informed Clinical research associatewriter that he was ready to go. WheClinical research associaten writer explained that only the Dr can discharge, pt waved his hand as if to excuse the Clinical research associatewriter from his room. Stated, "well you can leave and get somebody that can make a decision. Pt then got up from his bed and walked to the double door entrance to the 500 unit. Stated that he would "leave". Writer informed pt that the doors are locked. Pt continued to walk and when he reached the door he pushed. Stated, "they are locked", then walked away. Earlier in the shift pt approached the nurses station and demanded that staff tell him who Neita Goodnightlijah was. Stated, "tell me who Neita Goodnightlijah is". Pt informed the staff that the info could be found in the bible. Staff attempted to give the bible to the pt. Pt stated, "I don't want to look, I want you to tell me". Pt has no other questions or concerns.    A:  Support and encouragement was offered. 15 min checks continued for safety.  R: Pt remains safe.
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
## 2016-10-04 NOTE — Progress Notes (Signed)
Recreation Therapy Notes  INPATIENT RECREATION THERAPY ASSESSMENT  Patient Details Name: Jerry Thornton MRN: [id number] DOB: 1966-09-01 Today's Date: 10/04/2016  Patient Stressors: Other (Comment) ("None to speak of")  Pt stated he was here because his wife and father in-law brought him here.  Coping Skills:   Isolate, Avoidance, Exercise, Music  Personal Challenges: Anger, Communication, Concentration, Decision-Making, Expressing Yourself, Problem-Solving, Relationships, Self-Esteem/Confidence, Social Interaction, Stress Management, Time Management, Trusting Others  Leisure Interests (2+):  Music - Listen, Sports - Exercise (Comment) (Yoga)  Awareness of Community Resources:  Yes  Community Resources:  Other (Comment) (Running trails, greenway, track)  Current Use: Yes  Patient Strengths:  Resilient; Loyal  Patient Identified Areas of Improvement:  Communication  Current Recreation Participation:  2-3 times a week  Patient Goal for Hospitalization:  "Get better"  [place]  [place]   Current [place] (including self-harm):  No  Current HI:  No  Consent to Intern Participation: N/A   Caroll RancherMarjette Yalda Herd, LRT/CTRS  Caroll RancherLindsay, Kierah Goatley A 10/04/2016, 12:34 PM

## 2016-10-04 NOTE — Progress Notes (Signed)
At 1400 patient noted to pacing and agitated, encouraged the patient prn regimen for agitation he stated "I do not need it and want to speak with the doctor." After the speaking with the MD patient noted to be calm.

## 2016-10-04 NOTE — [facility] Group Notes (Signed)
[facility] LCSW Group Therapy  10/04/2016 1:15 pm  Type of Therapy: Process Group Therapy  Participation Level:  Active  Participation Quality:  Appropriate  Affect:  Flat  Cognitive:  Oriented  Insight:  Improving  Engagement in Group:  Limited  Engagement in Therapy:  Limited  Modes of Intervention:  Activity, Clarification, Education, Problem-solving and Support  Summary of Progress/Problems: Today's group addressed the issue of overcoming obstacles.  Patients were asked to identify their biggest obstacle post d/c that stands in the way of their on-going success, and then problem solve as to how to manage this. Stayed for most of the group-engaged throughout.  Talked about how he is his own biggest obstacle.  "I need to move from the intuitive to a more methodical approach.  Obviously intuitive is not working for me."  Stated this means making day by day plans, and executing those plans, and staying focused on one day at a time.  Jerry Thornton, Jerry Thornton 10/04/2016   3:40 PM

## 2016-10-05 ENCOUNTER — Encounter (HOSPITAL_COMMUNITY): Payer: Self-pay | Admitting: Psychiatry

## 2016-10-05 LAB — LIPID PANEL
CHOL/HDL RATIO: 3.2 ratio
CHOLESTEROL: 190 mg/dL (ref 0–200)
HDL: 59 mg/dL (ref >40)
LDL CALC: 109 mg/dL — AB (ref 0–99)
TRIGLYCERIDES: 108 mg/dL (ref <150)
VLDL: 22 mg/dL (ref 0–40)

## 2016-10-05 LAB — TSH: TSH: 0.758 u[IU]/mL (ref 0.350–4.500)

## 2016-10-05 MED ORDER — LORAZEPAM 0.5 MG PO TABS
0.5000 mg | ORAL_TABLET | Freq: Four times a day (QID) | ORAL | Status: DC | PRN
  Administered 2016-10-05 – 2016-10-08 (×4): 0.5 mg via ORAL
  Filled 2016-10-05 (×4): qty 1

## 2016-10-05 MED ORDER — TRAZODONE HCL 100 MG PO TABS
100.0000 mg | ORAL_TABLET | Freq: Every evening | ORAL | Status: DC | PRN
  Administered 2016-10-07 – 2016-10-10 (×3): 100 mg via ORAL
  Filled 2016-10-05 (×5): qty 1

## 2016-10-05 MED ORDER — ZIPRASIDONE MESYLATE 20 MG IM SOLR: 10.0000 mg | Freq: Three times a day (TID) | INTRAMUSCULAR | Status: DC | PRN

## 2016-10-05 MED ORDER — ZIPRASIDONE HCL 20 MG PO CAPS
20.0000 mg | ORAL_CAPSULE | Freq: Three times a day (TID) | ORAL | Status: DC | PRN
  Administered 2016-10-05 – 2016-10-13 (×4): 20 mg via ORAL
  Filled 2016-10-05 (×5): qty 1

## 2016-10-05 MED ORDER — LORAZEPAM 2 MG/ML IJ SOLN: 0.5000 mg | Freq: Four times a day (QID) | INTRAMUSCULAR | Status: DC | PRN

## 2016-10-05 MED ORDER — ARIPIPRAZOLE 5 MG PO TABS
5.0000 mg | ORAL_TABLET | Freq: Every day | ORAL | Status: DC
  Filled 2016-10-05 (×2): qty 1

## 2016-10-05 NOTE — Progress Notes (Signed)
D: [name] has been polite and cooperative on the unit this a.m (he got PRN Ativan PO at about 0630). At morning med pass, he indicated he would rather read his Bible than take medications. He was told med administration would be over quickly. He then indicated he would like to take the scheduled Lexapro but not the Abilify -- he said his reading would do more for him than the med would. After some discussion, which included urging him to discuss med concerns and stressing the importance of taking medications while in the hospital, he did take the medication. He did not have any issue with having an ECG done. He denied SI, HI, and AVH.   A: Meds given as ordered. Q15 safety checks maintained. Support/encouragement offered. Urged him to discussed medication needs/concerns with MD and approach staff with any concerns or questions.  R: Pt remains free from harm and continues with treatment. Will continue to monitor for needs/safety.

## 2016-10-05 NOTE — [facility] Group Notes (Signed)
[facility] LCSW Group Therapy  10/05/2016 , 11:59 AM   Type of Therapy:  Group Therapy  Participation Level:  Active  Participation Quality:  Attentive  Affect:  Appropriate  Cognitive:  Alert  Insight:  Improving  Engagement in Therapy:  Engaged  Modes of Intervention:  Discussion, Exploration and Socialization  Summary of Progress/Problems: Today's group focused on the term Diagnosis.  Participants were asked to define the term, and then pronounce whether it is a negative, positive or neutral term.  Invited.  Chose to not attend.  Jerry Thornton, Jerry Thornton 10/05/2016 , 11:59 AM

## 2016-10-05 NOTE — Progress Notes (Signed)
D: Pt denies SI/HI/AVH. Pt is pleasant and cooperative. Pt on milieu at times seems anxious.   A: Pt was offered support and encouragement. Pt was given scheduled medications. Pt was encourage to attend groups. Q 15 minute checks were done for safety.   R:Pt attends groups and interacts well with peers and staff. Pt is taking medication. Pt has no complaints.Pt receptive to treatment and safety maintained on unit.

## 2016-10-05 NOTE — Progress Notes (Signed)
Recreation Therapy Notes  Date: 10/05/16 Time: 1000 Location: 500 Hall Dayroom  Group Topic: Leisure Education  Goal Area(s) Addresses:  Patient will identify positive leisure activities.  Patient will identify one positive benefit of participation in leisure activities.   Behavioral Response: Minimal  Intervention: Construction paper, markers, scissors, glue sticks, magazines  Activity: Leisure Science writerBrochure.  Patients were to create a brochure that showed the various aspects of leisure.  The brochure could include activities from reading to traveling the world.  Patients were to highlight activities that interest them and activities the would like to learn to do or try.  Education: Leisure Education, Building control surveyorDischarge Planning  Education Outcome: Acknowledges education/In group clarification offered/Needs additional education  Clinical Observations/Feedback: Pt was flat and seemed depressed.  Pt started the activity but left early and did not return.   Caroll RancherMarjette Nayib Remer, LRT/CTRS        Lillia AbedLindsay, Gwendolynn Merkey A 10/05/2016 11:50 AM

## 2016-10-05 NOTE — Progress Notes (Signed)
  D: When asked about his day pt stated, "calmer. A lot calmer. However, pt wouldn't elaborate. Pt did appear to be calmer.   A:  Support and encouragement was offered. 15 min checks continued for safety.  R: Pt remains safe.

## 2016-10-05 NOTE — Plan of Care (Signed)
Problem: Safety: Goal: Periods of time without injury will increase Outcome: Progressing Pt safe on the unit at this time   

## 2016-10-05 NOTE — Progress Notes (Addendum)
Pt was pacing hall at quick pace with furrowed brow. This Clinical research associatewriter was told by staff that this behavior had precipitated previous escalation activity. He did not want to talk about what was bothering him, but agreed to take PRN Ativan 0.5 mg PO and and Geodon 20 mg PO. Meds given without issue. Encouraged him to approach staff with any needs/concerns if he would like to talk. Will continue to monitor for needs/safety.

## 2016-10-05 NOTE — Progress Notes (Signed)
Approximately an hour after PRN med administration, pt was observed lying on his bed in his room. He was still visibly tense but said he was doing OK. Urged him to approach staff with any concerns/needs/questions.

## 2016-10-05 NOTE — Progress Notes (Signed)
[facility]  10/05/2016 12:27 PM Jerry Thornton  MRN:  [id number] Subjective: Patient states " I was having some stressors at work and otherwise. My brother passed away from a suicide 2 1/2 years ago. The last time I saw him was for thanksgiving and he killed himself soon after in February . I am depressed , I do have anxiety when I have these thoughts and I have sleep issues."  Objective: Jerry Thornton is a [age] y old CM , married , employed as an Pensions consultantattorney , lives [place] and children in [place] , who has a hx of previous anxiety, depression and hospital admissions in the past, who presented for worsening depression and paranoia.  Patient seen and chart reviewed.Discussed patient with treatment team.  Pt today seen as very tearful , sad, anxious - ruminates about his inability to cope with the loss of his brother who shot self two and half years ago. Its the death anniversary coming up in February and he reports that he last saw him during the holidays . Pt very tearful when he spoke about his brother . Pt at that time had some thought blocking as well as difficulty communicating with Clinical research associatewriter. Pt however , was able to get over it and sit through the evaluation. Pt reports paranoia - but states it is getting better. Pt educated about the diagnosis as well as his medications. Pt voiced understanding.  With consent from patient - writer was able to contact his wife - Lars MageDanielle Gosney - # noted in chart . Per wife patient has overworked himself all his life , always taking up more than what he can handle . Pt also has been doing a lot for a brother who was in [place] and was in a lot of trouble. Pt recently appeared paranoid and delusional , had trust issues with wife and so on. Pt also felt police were after him, wife was about to leave and that the company is going to fire him. Per wife none of this is true. Hence patient was brought in to get help.Per wife patient had two admissions in the past -  1998 and 2005 during which [facility] - but both times patient was able to talk his way out soon and stopped taking his medications.     Principal Problem: MDD (major depressive disorder), single episode, severe with psychotic features (HCC) Diagnosis:   Patient Active Problem List   Diagnosis Date Noted  . Generalized anxiety disorder [F41.1] 10/03/2016  . MDD (major depressive disorder), single episode, severe with psychotic features (HCC) [F32.3] 10/03/2016   Total Time spent with patient: 45 minutes  Past Psychiatric History: Please see H&P.   Past Medical History:  Past Medical History:  Diagnosis Date  . Allergy   . Depression    History reviewed. No pertinent surgical history. Family History:  Family History  Problem Relation Age of Onset  . Mental illness Father   . Heart disease Paternal Grandfather   . Mental illness Brother   . Suicidality Brother    Family Psychiatric  History: father has depression, his brother shot self and died 2 and 1/2 yrs ago. Social History: Please see H&P.  History  Alcohol Use  . Yes    Comment: 2 drinks/week     History  Drug Use No    Social History   Social History  . Marital status: Married    Spouse name: N/A  .  Number of children: N/A  . Years of education: N/A   Social History Main Topics  . Smoking status: Never Smoker  . Smokeless tobacco: Never Used  . Alcohol use Yes     Comment: 2 drinks/week  . Drug use: No  . Sexual activity: Yes   Other Topics Concern  . None   Social History Narrative  . None   Additional Social History:    Pain Medications: none Prescriptions: none Over the Counter: none History of alcohol / drug use?: No history of alcohol / drug abuse                    Sleep: restless  Appetite:  Fair  Current Medications: Current Facility-Administered Medications  Medication Dose Route Frequency Provider Last Rate Last Dose  .  acetaminophen (TYLENOL) tablet 650 mg  650 mg Oral Q6H PRN Beau FannyJohn C Withrow, FNP      . alum & mag hydroxide-simeth (MAALOX/MYLANTA) 200-200-20 MG/5ML suspension 30 mL  30 mL Oral Q4H PRN Beau FannyJohn C Withrow, FNP      . [START ON 10/06/2016] ARIPiprazole (ABILIFY) tablet 5 mg  5 mg Oral QHS Nansi Birmingham, MD      . escitalopram (LEXAPRO) tablet 10 mg  10 mg Oral Daily Beau FannyJohn C Withrow, FNP   10 mg at 10/05/16 16100833  . LORazepam (ATIVAN) tablet 0.5 mg  0.5 mg Oral Q6H PRN Jomarie LongsSaramma Lacy Sofia, MD       Or  . LORazepam (ATIVAN) injection 0.5 mg  0.5 mg Intramuscular Q6H PRN Adriaan Maltese, MD      . magnesium hydroxide (MILK OF MAGNESIA) suspension 30 mL  30 mL Oral Daily PRN Beau FannyJohn C Withrow, FNP      . traZODone (DESYREL) tablet 100 mg  100 mg Oral QHS PRN,MR X 1 Surena Welge, MD      . ziprasidone (GEODON) capsule 20 mg  20 mg Oral TID PRN Jomarie LongsSaramma Andrej Spagnoli, MD       Or  . ziprasidone (GEODON) injection 10 mg  10 mg Intramuscular TID PRN Jomarie LongsSaramma Rease Swinson, MD        Lab Results:  Results for orders placed or performed during the hospital encounter of 10/03/16 (from the past 48 hour(s))  TSH     Status: None   Collection Time: 10/05/16  6:41 AM  Result Value Ref Range   TSH 0.758 0.350 - 4.500 uIU/mL    Comment: Performed by a 3rd Generation assay with a functional sensitivity of <=0.01 uIU/mL. [facility]formed [facility]   Lipid panel     Status: Abnormal   Collection Time: 10/05/16  6:41 AM  Result Value Ref Range   Cholesterol 190 0 - 200 mg/dL   Triglycerides 960108 <454<150 mg/dL   HDL 59 >09>40 mg/dL   Total CHOL/HDL Ratio 3.2 RATIO   VLDL 22 0 - 40 mg/dL   LDL Cholesterol 811109 (H) 0 - 99 mg/dL    Comment:        Total Cholesterol/HDL:CHD Risk Coronary Heart Disease Risk Table                     Men   Women  1/2 Average Risk   3.4   3.3  Average Risk       5.0   4.4  2 X Average Risk   9.6   7.1  3 X Average Risk  23.4   11.0        Use  the calculated Patient Ratio above and the CHD  Risk Table to determine the patient's CHD Risk.        ATP III CLASSIFICATION (LDL):  <100     mg/dL   Optimal  409-811  mg/dL   Near or Above                    Optimal  130-159  mg/dL   Borderline  914-782  mg/dL   High  >956     mg/dL   Very High Performed at [facility]     Blood Alcohol level:  Lab Results  Component Value Date   [facility] <5 10/02/2016    Metabolic Disorder Labs: No results found for: HGBA1C, MPG No results found for: PROLACTIN Lab Results  Component Value Date   CHOL 190 10/05/2016   TRIG 108 10/05/2016   HDL 59 10/05/2016   CHOLHDL 3.2 10/05/2016   VLDL 22 10/05/2016   LDLCALC 109 (H) 10/05/2016    Physical Findings: AIMS:  , ,  ,  ,    CIWA:    COWS:     Musculoskeletal: Strength & Muscle Tone: within normal limits Gait & Station: normal Patient leans: N/A  Psychiatric Specialty Exam: Physical Exam  Nursing note and vitals reviewed.   Review of Systems  Psychiatric/Behavioral: Positive for depression. The patient is nervous/anxious and has insomnia.   All other systems reviewed and are negative.   Blood pressure 130/81, pulse 93, temperature 98.5 F (36.9 C), temperature source Oral, resp. rate 20, height 6\' 2"  (1.88 m), weight 97.5 kg (215 lb).Body mass index is 27.6 kg/m.  General Appearance: Guarded  Eye Contact:  Fair  Speech:  Normal Rate  Volume:  Normal  Mood:  Anxious, Depressed and Dysphoric  Affect:  Tearful  Thought Process:  Goal Directed and Descriptions of Associations: Intact  Orientation:  Full (Time, Place, and Person)  Thought Content:  Paranoid Ideation and Rumination  Suicidal Thoughts:  No  Homicidal Thoughts:  No  Memory:  Immediate;   Fair Recent;   Fair Remote;   Fair  Judgement:  Impaired  Insight:  Shallow  Psychomotor Activity:  Normal  Concentration:  Concentration: Fair and Attention Span: Fair  Recall:  [employer] of Knowledge:  Fair  Language:  Fair  Akathisia:  No  Handed:  Right   AIMS (if indicated):     Assets:  Desire for Improvement  ADL's:  Intact  Cognition:  WNL  Sleep:  Number of Hours: 5.25     Treatment Plan Summary: Patient today seen as tearful , ruminates about his loss and other stressors. Pt will benefit from a grief counseling/IOP - and will readjust his medications.  MDD (major depressive disorder), single episode, severe with psychotic features (HCC) unstable  Will continue today 10/05/16  plan as below except where it is noted.    Daily contact with patient to assess and evaluate symptoms and progress in treatment and Medication management  Reviewed past medical records,treatment plan.   For Depression: Continue Lexapro 10 mg po daily. Abilify 5 mg po qhs.  For psychosis: Abilify 5 mg po qhs.  For insomnia: Trazodone 50 mg po qhs prn , rpt x 1.  For anxiety/agitation: PRN medications as per unit protocol.  Will continue to monitor vitals ,medication compliance and treatment side effects while patient is here.   Will monitor for medical issues as well as call consult as needed.   Reviewed labs -  hba1c, pl pending.  I have reviewed EKG for qtc - wnl.  CSW will continue working on disposition. Referal to IOP/Grief counseling.  Please see above collateral information obtained from wife.  Patient to participate in therapeutic milieu .      Victoria Henshaw, MD 10/05/2016, 12:27 PM

## 2016-10-06 LAB — PROLACTIN: PROLACTIN: 10.8 ng/mL (ref 4.0–15.2)

## 2016-10-06 LAB — HEMOGLOBIN A1C
HEMOGLOBIN A1C: 5 % (ref 4.8–5.6)
Mean Plasma Glucose: 97 mg/dL

## 2016-10-06 MED ORDER — ARIPIPRAZOLE 10 MG PO TABS
10.0000 mg | ORAL_TABLET | Freq: Every day | ORAL | Status: DC
  Administered 2016-10-07: 10 mg via ORAL
  Filled 2016-10-06 (×3): qty 1

## 2016-10-06 MED ORDER — ARIPIPRAZOLE 10 MG PO TABS
10.0000 mg | ORAL_TABLET | Freq: Every day | ORAL | Status: DC
  Administered 2016-10-06: 10 mg via ORAL
  Filled 2016-10-06 (×2): qty 1

## 2016-10-06 NOTE — Progress Notes (Addendum)
D: Pt denies SI/HI/AVH. Pt is pleasant and cooperative. Pt kept to himself much of the evening, pt was appropriate when engaged , but forwards little.  A: Pt was offered support and encouragement. Pt was given scheduled medications. Pt was encourage to attend groups. Q 15 minute checks were done for safety.   R: Pt is taking medication. Pt has no complaints at this time .Pt receptive to treatment and safety maintained on unit.

## 2016-10-06 NOTE — Tx Team (Signed)
Interdisciplinary Treatment and Diagnostic Plan Update  10/06/2016 Time of Session: 10:54 AM  Jerry Thornton MRN: [id number]  Principal Diagnosis: MDD (major depressive disorder), single episode, severe with psychotic features ([place])  Secondary Diagnoses: Principal Problem:   MDD (major depressive disorder), single episode, severe with psychotic features ([place])   Current Medications:  Current Facility-Administered Medications  Medication Dose Route Frequency Provider Last Rate Last Dose  . acetaminophen (TYLENOL) tablet 650 mg  650 mg Oral Q6H PRN Benjamine Mola, FNP      . alum & mag hydroxide-simeth (MAALOX/MYLANTA) 200-200-20 MG/5ML suspension 30 mL  30 mL Oral Q4H PRN Benjamine Mola, FNP      . ARIPiprazole (ABILIFY) tablet 5 mg  5 mg Oral QHS Saramma Eappen, MD      . escitalopram (LEXAPRO) tablet 10 mg  10 mg Oral Daily Benjamine Mola, FNP   10 mg at 10/06/16 0809  . LORazepam (ATIVAN) tablet 0.5 mg  0.5 mg Oral Q6H PRN Ursula Alert, MD   0.5 mg at 10/06/16 0227   Or  . LORazepam (ATIVAN) injection 0.5 mg  0.5 mg Intramuscular Q6H PRN Saramma Eappen, MD      . magnesium hydroxide (MILK OF MAGNESIA) suspension 30 mL  30 mL Oral Daily PRN Benjamine Mola, FNP      . traZODone (DESYREL) tablet 100 mg  100 mg Oral QHS PRN,MR X 1 Saramma Eappen, MD      . ziprasidone (GEODON) capsule 20 mg  20 mg Oral TID PRN Ursula Alert, MD   20 mg at 10/06/16 0227   Or  . ziprasidone (GEODON) injection 10 mg  10 mg Intramuscular TID PRN Ursula Alert, MD        PTA Medications: Prescriptions Prior to Admission  Medication Sig Dispense Refill Last Dose  . ibuprofen (ADVIL,MOTRIN) 200 MG tablet Take 200 mg by mouth every 6 (six) hours as needed. Reported on 04/26/2016   Past Week at Unknown time  . Multiple Vitamins-Minerals (MULTIVITAMIN WITH MINERALS) tablet Take 1 tablet by mouth daily. Reported on 04/26/2016   Past Week at Unknown time  . Omega-3 Fatty Acids (FISH OIL PO) Take 1 capsule by  mouth daily. Reported on 04/26/2016   Past Week at Unknown time    Treatment Modalities: Medication Management, Group therapy, Case management,  1 to 1 session with clinician, Psychoeducation, Recreational therapy.   Physician Treatment Plan for Primary Diagnosis: MDD (major depressive disorder), single episode, severe with psychotic features ([place]) Long Term Goal(s): Improvement in symptoms so as ready for discharge  Short Term Goals: Ability to identify changes in lifestyle to reduce recurrence of condition will improve Compliance with prescribed medications will improve  Medication Management: Evaluate patient's response, side effects, and tolerance of medication regimen.  Therapeutic Interventions: 1 to 1 sessions, Unit Group sessions and Medication administration.  Evaluation of Outcomes: Progressing  Physician Treatment Plan for Secondary Diagnosis: Principal Problem:   MDD (major depressive disorder), single episode, severe with psychotic features ([place])   Long Term Goal(s): Improvement in symptoms so as ready for discharge  Short Term Goals:Ability to disclose and discuss suicidal ideas Ability to identify and develop effective coping behaviors will improve   Medication Management: Evaluate patient's response, side effects, and tolerance of medication regimen.  Therapeutic Interventions: 1 to 1 sessions, Unit Group sessions and Medication administration.  Evaluation of Outcomes: Progressing   RN Treatment Plan for Primary Diagnosis: MDD (major depressive disorder), single episode, severe with psychotic features ([place]) Long  Term Goal(s): Knowledge of disease and therapeutic regimen to maintain health will improve  Short Term Goals: Ability to verbalize feelings will improve and Ability to identify and develop effective coping behaviors will improve  Medication Management: RN will administer medications as ordered by provider, will assess and evaluate patient's response and  provide education to patient for prescribed medication. RN will report any adverse and/or side effects to prescribing provider.  Therapeutic Interventions: 1 on 1 counseling sessions, Psychoeducation, Medication administration, Evaluate responses to treatment, Monitor vital signs and CBGs as ordered, Perform/monitor CIWA, COWS, AIMS and Fall Risk screenings as ordered, Perform wound care treatments as ordered.  Evaluation of Outcomes: Progressing   Recreational Therapy Treatment Plan for Primary Diagnosis: MDD (major depressive disorder), single episode, severe with psychotic features ([place]) Long Term Goal(s): LTG- Patient will participate in recreation therapy tx in at least 2 group sessions without prompting from LRT.  Short Term Goals: Without prompting or encouragement patient will spontaneously contribute to discussions during at least 2 recreation therapy group sessions by conclusion of recreation therapy tx.  Treatment Modalities: Group and Pet Therapy  Therapeutic Interventions: Psychoeducation  Evaluation of Outcomes: Progressing   LCSW Treatment Plan for Primary Diagnosis: MDD (major depressive disorder), single episode, severe with psychotic features ([place]) Long Term Goal(s): Safe transition to appropriate next level of care at discharge, Engage patient in therapeutic group addressing interpersonal concerns.  Short Term Goals: Engage patient in aftercare planning with referrals and resources  Therapeutic Interventions: Assess for all discharge needs, 1 to 1 time with Social worker, Explore available resources and support systems, Assess for adequacy in community support network, Educate family and significant other(s) on suicide prevention, Complete Psychosocial Assessment, Interpersonal group therapy.  Evaluation of Outcomes: Met  Return home, follow up IOP   Progress in Treatment: Attending groups: Yes Participating in groups: Yes Taking medication as prescribed:  Yes Toleration medication: Yes, no side effects reported at this time Family/Significant other contact made: Yes Patient understands diagnosis: No  Limited insight Discussing patient identified problems/goals with staff: Yes Medical problems stabilized or resolved: Yes Denies suicidal/homicidal ideation: Yes Issues/concerns per patient self-inventory: None Other: N/A  New problem(s) identified: None identified at this time.   New Short Term/Long Term Goal(s): None identified at this time.   Discharge Plan or Barriers:   Reason for Continuation of Hospitalization: Depression Hallucinations Medication stabilization   Estimated Length of Stay: 2-4 days  Attendees: Patient: 10/06/2016  10:54 AM  Physician: Neita Garnet, MD 10/06/2016  10:54 AM  Nursing: Hoy Register, RN 10/06/2016  10:54 AM  RN Care Manager: Lars Pinks, RN 10/06/2016  10:54 AM  Social Worker: Ripley Fraise 10/06/2016  10:54 AM  Recreational Therapist: Laretta Bolster  10/06/2016  10:54 AM  Other: Norberto Sorenson 10/06/2016  10:54 AM  Other:  10/06/2016  10:54 AM    Scribe for Treatment Team:  Roque Lias LCSW 10/06/2016 10:54 AM

## 2016-10-06 NOTE — Progress Notes (Signed)
DAR NOTE: Pt present with flat affect and depressed mood in the unit. Pt has been isolating himself most of the time. Pt denies physical pain, took all his meds as scheduled. As per self inventory, pt had a good night sleep, good appetite, normal energy, and good concentration. Pt rate depression at 0, hopeless ness at 0, and anxiety at 4. Pt's safety ensured with 15 minute and environmental checks. Pt currently denies SI/HI and A/V hallucinations. Pt verbally agrees to seek staff if SI/HI or A/VH occurs and to consult with staff before acting on these thoughts. Will continue POC.

## 2016-10-06 NOTE — Progress Notes (Signed)
Recreation Therapy Notes  Date: 10/06/16 Time: 1000 Location: 500 Hall Dayroom  Group Topic: Wellness  Goal Area(s) Addresses:  Patient will define components of whole wellness. Patient will verbalize benefit of whole wellness.  Behavioral Response: Engaged  Intervention:  ArchivistChairs, beach ball  Activity: Insurance claims handlereated Soccer.  LRT arranged the chairs facing the front of the room.  Patients were to try to get the ball past the LRT.  Patients were to work together to come up with a strategy on how to get the ball past the LRT.  Each time the ball got past the LRT, that was a point.  Education: Wellness, Building control surveyorDischarge Planning.   Education Outcome: Acknowledges education/In group clarification offered/Needs additional education.   Clinical Observations/Feedback: Pt was flat and seemed depressed.  Pt was active during the activity and fully participated.  Pt stated focusing on wellness will help him to find a balance within himself.   Caroll RancherMarjette Amybeth Sieg, LRT/CTRS         Caroll RancherLindsay, Tynasia Mccaul A 10/06/2016 12:10 PM

## 2016-10-06 NOTE — [facility] Group Notes (Signed)
[facility]  10/06/2016 , 1:35 PM    Type of Therapy:  [facility] [employer] Presentation  Participation Level:  Active  Participation Quality:  Attentive  Affect:  Blunted  Cognitive:  Oriented  Insight:  Limited  Engagement in Therapy:  Engaged  Modes of Intervention:  Discussion, Education and Socialization  Summary of Progress/Problems:  Jerry Thornton from [employer] came to present his recovery story and play the guitar.  Stayed the entire time, engaged throughout.  Daryel Geraldorth, Jerry Thornton 10/06/2016 , 1:35 PM

## 2016-10-06 NOTE — Plan of Care (Signed)
Problem: Coping: Goal: Ability to demonstrate self-control will improve Outcome: Progressing Pt has not acted out and did not need to be redirected this evening

## 2016-10-06 NOTE — Progress Notes (Signed)
[facility] MD Progress Note  10/06/2016 5:50 PM Matthew Cina  MRN:  [id number] Subjective: Patient reports ongoing depression, sadness. Denies medication side effects, denies suicidal ideations. Denies hallucinations.    Objective:  I have discussed case with treatment team and have met with patient . Patient presents depressed, sad, guarded,vaguely paranoid. For example, although he does not endorse any clear delusional ideations, he states he feels people are " staring" at him and that cannot tolerate being in cafeteria during meal times because " it's too much stimulation- too much going on", and describes ongoing speech is soft and somewhat slowed. States he feels cognitively slowed  Denies medication side effects. Of note, as discussed with staff/RN,he has been accepting antidepressant ( Lexapro) , but has been more reluctant to accept antipsychotic medication ( Abilify)  Tends to isolate, limited participation in milieu.  Principal Problem: MDD (major depressive disorder), single episode, severe with psychotic features ([place]) Diagnosis:   Patient Active Problem List   Diagnosis Date Noted  . Generalized anxiety disorder [F41.1] 10/03/2016  . MDD (major depressive disorder), single episode, severe with psychotic features ([place]) [F32.3] 10/03/2016   Total Time spent with patient: 20 minutes   Past Psychiatric History: Please see H&P.   Past Medical History:  Past Medical History:  Diagnosis Date  . Allergy   . Depression    History reviewed. No pertinent surgical history. Family History:  Family History  Problem Relation Age of Onset  . Mental illness Father   . Heart disease Paternal Grandfather   . Mental illness Brother   . Suicidality Brother    Family Psychiatric  History: father has depression, his brother shot self and died 2 and 1/2 yrs ago. Social History: Please see H&P.  History  Alcohol Use  . Yes    Comment: 2 drinks/week     History  Drug Use No     Social History   Social History  . Marital status: Married    Spouse name: N/A  . Number of children: N/A  . Years of education: N/A   Social History Main Topics  . Smoking status: Never Smoker  . Smokeless tobacco: Never Used  . Alcohol use Yes     Comment: 2 drinks/week  . Drug use: No  . Sexual activity: Yes   Other Topics Concern  . None   Social History Narrative  . None   Additional Social History:    Pain Medications: none Prescriptions: none Over the Counter: none History of alcohol / drug use?: No history of alcohol / drug abuse  Sleep: fair   Appetite:  Fair  Current Medications: Current Facility-Administered Medications  Medication Dose Route Frequency Provider Last Rate Last Dose  . acetaminophen (TYLENOL) tablet 650 mg  650 mg Oral Q6H PRN Benjamine Mola, FNP      . alum & mag hydroxide-simeth (MAALOX/MYLANTA) 200-200-20 MG/5ML suspension 30 mL  30 mL Oral Q4H PRN Benjamine Mola, FNP      . ARIPiprazole (ABILIFY) tablet 10 mg  10 mg Oral QHS Myer Peer Ganesh Deeg, MD      . escitalopram (LEXAPRO) tablet 10 mg  10 mg Oral Daily Benjamine Mola, FNP   10 mg at 10/06/16 0809  . LORazepam (ATIVAN) tablet 0.5 mg  0.5 mg Oral Q6H PRN Ursula Alert, MD   0.5 mg at 10/06/16 0227   Or  . LORazepam (ATIVAN) injection 0.5 mg  0.5 mg Intramuscular Q6H PRN Ursula Alert, MD      .  magnesium hydroxide (MILK OF MAGNESIA) suspension 30 mL  30 mL Oral Daily PRN John C Withrow, FNP      . traZODone (DESYREL) tablet 100 mg  100 mg Oral QHS PRN,MR X 1 Saramma Eappen, MD      . ziprasidone (GEODON) capsule 20 mg  20 mg Oral TID PRN Saramma Eappen, MD   20 mg at 10/06/16 0227   Or  . ziprasidone (GEODON) injection 10 mg  10 mg Intramuscular TID PRN Saramma Eappen, MD        Lab Results:  Results for orders placed or performed during the hospital encounter of 10/03/16 (from the past 48 hour(s))  TSH     Status: None   Collection Time: 10/05/16  6:41 AM  Result Value Ref  Range   TSH 0.758 0.350 - 4.500 uIU/mL    Comment: Performed by a 3rd Generation assay with a functional sensitivity of <=0.01 uIU/mL. Performed at [facility]   Prolactin     Status: None   Collection Time: 10/05/16  6:41 AM  Result Value Ref Range   Prolactin 10.8 4.0 - 15.2 ng/mL    Comment: (NOTE) Performed At: [facility] [address] [place], [place] [id number] [place] William F MD Ph:[phone] Performed at [facility]   Hemoglobin A1c     Status: None   Collection Time: 10/05/16  6:41 AM  Result Value Ref Range   Hgb A1c MFr Bld 5.0 4.8 - 5.6 %    Comment: (NOTE)         Pre-diabetes: 5.7 - 6.4         Diabetes: >6.4         Glycemic control for adults with diabetes: <7.0    Mean Plasma Glucose 97 mg/dL    Comment: (NOTE) Performed At: [facility] [place] [address] [place], [place] [id number] H[place] William F MD Ph:[phone] Performed at [facility]   Lipid panel     Status: Abnormal   Collection Time: 10/05/16  6:41 AM  Result Value Ref Range   Cholesterol 190 0 - 200 mg/dL   Triglycerides 108 <150 mg/dL   HDL 59 >40 mg/dL   Total CHOL/HDL Ratio 3.2 RATIO   VLDL 22 0 - 40 mg/dL   LDL Cholesterol 109 (H) 0 - 99 mg/dL    Comment:        Total Cholesterol/HDL:CHD Risk Coronary Heart Disease Risk Table                     Men   Women  1/2 Average Risk   3.4   3.3  Average Risk       5.0   4.4  2 X Average Risk   9.6   7.1  3 X Average Risk  23.4   11.0        Use the calculated Patient Ratio above and the CHD Risk Table to determine the patient's CHD Risk.        ATP III CLASSIFICATION (LDL):  <100     mg/dL   Optimal  100-129  mg/dL   Near or Above                    Optimal  130-159  mg/dL   Borderline  160-189  mg/dL   High  >190     mg/dL   Very High Performed at [facility]     Blood Alcohol level:    Lab Results  Component Value Date   ETH <5 85/46/2703     Metabolic Disorder Labs: Lab Results  Component Value Date   HGBA1C 5.0 10/05/2016   MPG 97 10/05/2016   Lab Results  Component Value Date   PROLACTIN 10.8 10/05/2016   Lab Results  Component Value Date   CHOL 190 10/05/2016   TRIG 108 10/05/2016   HDL 59 10/05/2016   CHOLHDL 3.2 10/05/2016   VLDL 22 10/05/2016   LDLCALC 109 (H) 10/05/2016    Physical Findings: AIMS:  , ,  ,  ,    CIWA:    COWS:     Musculoskeletal: Strength & Muscle Tone: within normal limits Gait & Station: normal Patient leans: N/A  Psychiatric Specialty Exam: Physical Exam  Nursing note and vitals reviewed.   Review of Systems  Psychiatric/Behavioral: Positive for depression. The patient is nervous/anxious and has insomnia.   All other systems reviewed and are negative.   Blood pressure (!) 115/55, pulse 71, temperature 98.6 F (37 C), temperature source Oral, resp. rate 18, height 6' 2" (1.88 m), weight 97.5 kg (215 lb).Body mass index is 27.6 kg/m.  General Appearance: Guarded/ fairly groomed   Eye Contact:  Good   Speech:  Normal Rate  Volume:  Soft   Mood:  depressed  Affect:  Constricted, anxious, guarded   Thought Process: presents linear , slowed   Orientation:  Full (Time, Place, and Person)  Thought Content:  Reports vague paranoid ideations but does not express any specific delusional ideations, denies hallucinations, does not appear internally preoccupied   Suicidal Thoughts:  No denies suicidal or self injurious ideations , contracts for safety on the unit   Homicidal Thoughts:  No denies any homicidal ideations   Memory:  Immediate;   Fair Recent;   Fair Remote;   Fair  Judgement:  Fair   Insight:  Fair   Psychomotor Activity:  Decreased   Concentration:  Concentration: Fair and Attention Span: Fair  Recall:  [employer] of Knowledge:  Fair  Language:  Fair  Akathisia:  No  Handed:  Right  AIMS (if indicated):     Assets:  Desire for Improvement  ADL's:  Intact   Cognition:  WNL  Sleep:  Number of Hours: 4.5    Assessment - patient remains depressed, sad, ruminative, and presenting with vague but significant paranoia/ guarded , suspicious demeanor. Denies suicidal ideations .  Tolerating Lexapro, Abilify well, and at this time states he will accept, comply with Abilify prescription as well    Treatment Plan Summary: ther stressors.  MDD (major depressive disorder), single episode, severe with psychotic features ([place])   Daily contact with patient to assess and evaluate symptoms and progress in treatment and Medication management  Reviewed past medical records,treatment plan.   For Depression: Continue Lexapro 10 mg po daily.   For psychosis: Increase Abilify to 10  mg po qhs.  For insomnia: Trazodone 50 mg po qhs prn as needed   For anxiety/agitation: PRN medications as per unit protocol.  Continue to encourage increased group and milieu participation to work on coping skills and symptom reduction Treatment team working on disposition planning options   Neita Garnet, MD 10/06/2016, 5:50 PM   Patient ID: Kairav Russomanno, male   DOB: 04/30/1966, 50 y.o.   MRN: [id number]

## 2016-10-06 NOTE — [facility] Group Notes (Signed)
Pt did not attend wrap up group this evening.  

## 2016-10-07 ENCOUNTER — Ambulatory Visit (HOSPITAL_COMMUNITY): Payer: BLUE CROSS/BLUE SHIELD | Admitting: Licensed Clinical Social Worker

## 2016-10-07 DIAGNOSIS — F411 Generalized anxiety disorder: Secondary | ICD-10-CM

## 2016-10-07 DIAGNOSIS — Z818 Family history of other mental and behavioral disorders: Secondary | ICD-10-CM

## 2016-10-07 DIAGNOSIS — F323 Major depressive disorder, single episode, severe with psychotic features: Principal | ICD-10-CM

## 2016-10-07 DIAGNOSIS — Z79899 Other long term (current) drug therapy: Secondary | ICD-10-CM

## 2016-10-07 DIAGNOSIS — Z8249 Family history of ischemic heart disease and other diseases of the circulatory system: Secondary | ICD-10-CM

## 2016-10-07 MED ORDER — ESCITALOPRAM OXALATE 10 MG PO TABS: 10.0000 mg | ORAL_TABLET | Freq: Every day | ORAL | Status: DC

## 2016-10-07 MED ORDER — ESCITALOPRAM OXALATE 10 MG PO TABS
10.0000 mg | ORAL_TABLET | Freq: Every day | ORAL | Status: DC
  Filled 2016-10-07: qty 1

## 2016-10-07 NOTE — Progress Notes (Signed)
[facility] MD Progress Note  10/07/2016 2:21 PM Tiger Spieker  MRN:  [id number]  Subjective: Patient reports ongoing depression, sadness. Says his body feels heavy after taking Lexapro in the morning. Has agreed for Lexapro to be administered in the evening at 1800 starting on 10-08-16. He currently denies suicidal ideations. Denies hallucinations.   Objective:  I have discussed case with treatment team and have met with patient . Patient presents depressed, sad, guarded, vaguely paranoid. For example, although he does not endorse any clear delusional ideations, he states he feels people are " staring" at him and that cannot tolerate being in cafeteria during meal times because " it's too much stimulation- too much going on", and describes ongoing speech is soft and somewhat slowed. States he feels cognitively slowed  Denies medication side effects. Of note, as discussed with staff/RN,he has been accepting antidepressant ( Lexapro) , but has been more reluctant to accept antipsychotic medication ( Abilify)  Tends to isolate, limited participation in milieu.  Principal Problem: MDD (major depressive disorder), single episode, severe with psychotic features ([place])  Diagnosis:   Patient Active Problem List   Diagnosis Date Noted  . Generalized anxiety disorder [F41.1] 10/03/2016  . MDD (major depressive disorder), single episode, severe with psychotic features ([place]) [F32.3] 10/03/2016   Total Time spent with patient: 15 minutes   Past Psychiatric History: Major depression   Past Medical History:  Past Medical History:  Diagnosis Date  . Allergy   . Depression    History reviewed. No pertinent surgical history.  Family History:  Family History  Problem Relation Age of Onset  . Mental illness Father   . Heart disease Paternal Grandfather   . Mental illness Brother   . Suicidality Brother    Family Psychiatric  History: Father has depression, his brother shot self and died 2 and 1/2 yrs  ago. Social History: Please see H&P.  History  Alcohol Use  . Yes    Comment: 2 drinks/week     History  Drug Use No    Social History   Social History  . Marital status: Married    Spouse name: N/A  . Number of children: N/A  . Years of education: N/A   Social History Main Topics  . Smoking status: Never Smoker  . Smokeless tobacco: Never Used  . Alcohol use Yes     Comment: 2 drinks/week  . Drug use: No  . Sexual activity: Yes   Other Topics Concern  . None   Social History Narrative  . None   Additional Social History:    Pain Medications: none Prescriptions: none Over the Counter: none History of alcohol / drug use?: No history of alcohol / drug abuse  Sleep: fair   Appetite:  Fair  Current Medications: Current Facility-Administered Medications  Medication Dose Route Frequency Provider Last Rate Last Dose  . acetaminophen (TYLENOL) tablet 650 mg  650 mg Oral Q6H PRN Benjamine Mola, FNP      . alum & mag hydroxide-simeth (MAALOX/MYLANTA) 200-200-20 MG/5ML suspension 30 mL  30 mL Oral Q4H PRN Benjamine Mola, FNP      . ARIPiprazole (ABILIFY) tablet 10 mg  10 mg Oral QHS Jenne Campus, MD      . Derrill Memo ON 10/08/2016] escitalopram (LEXAPRO) tablet 10 mg  10 mg Oral QHS Encarnacion Slates, NP      . LORazepam (ATIVAN) tablet 0.5 mg  0.5 mg Oral Q6H PRN Ursula Alert, MD   0.5 mg  at 10/07/16 0830   Or  . LORazepam (ATIVAN) injection 0.5 mg  0.5 mg Intramuscular Q6H PRN Saramma Eappen, MD      . magnesium hydroxide (MILK OF MAGNESIA) suspension 30 mL  30 mL Oral Daily PRN Benjamine Mola, FNP      . traZODone (DESYREL) tablet 100 mg  100 mg Oral QHS PRN,MR X 1 Saramma Eappen, MD      . ziprasidone (GEODON) capsule 20 mg  20 mg Oral TID PRN Ursula Alert, MD   20 mg at 10/07/16 1054   Or  . ziprasidone (GEODON) injection 10 mg  10 mg Intramuscular TID PRN Ursula Alert, MD        Lab Results:  No results found for this or any previous visit (from the past 24  hour(s)).  Blood Alcohol level:  Lab Results  Component Value Date   ETH <5 61/47/0929    Metabolic Disorder Labs: Lab Results  Component Value Date   HGBA1C 5.0 10/05/2016   MPG 97 10/05/2016   Lab Results  Component Value Date   PROLACTIN 10.8 10/05/2016   Lab Results  Component Value Date   CHOL 190 10/05/2016   TRIG 108 10/05/2016   HDL 59 10/05/2016   CHOLHDL 3.2 10/05/2016   VLDL 22 10/05/2016   LDLCALC 109 (H) 10/05/2016    Physical Findings: AIMS:  , ,  ,  ,    CIWA:    COWS:     Musculoskeletal: Strength & Muscle Tone: within normal limits Gait & Station: normal Patient leans: N/A  Psychiatric Specialty Exam: Physical Exam  Nursing note and vitals reviewed.   Review of Systems  Psychiatric/Behavioral: Positive for depression. The patient is nervous/anxious and has insomnia.   All other systems reviewed and are negative.   Blood pressure 138/70, pulse 84, temperature 97.6 F (36.4 C), temperature source Oral, resp. rate 17, height 6' 2" (1.88 m), weight 97.5 kg (215 lb).Body mass index is 27.6 kg/m.  General Appearance: Guarded/ fairly groomed   Eye Contact:  Good   Speech:  Normal Rate  Volume:  Soft   Mood:  depressed  Affect:  Constricted, anxious, guarded   Thought Process: presents linear , slowed   Orientation:  Full (Time, Place, and Person)  Thought Content:  Reports vague paranoid ideations but does not express any specific delusional ideations, denies hallucinations, does not appear internally preoccupied   Suicidal Thoughts:  No denies suicidal or self injurious ideations , contracts for safety on the unit   Homicidal Thoughts:  No denies any homicidal ideations   Memory:  Immediate;   Fair Recent;   Fair Remote;   Fair  Judgement:  Fair   Insight:  Fair   Psychomotor Activity:  Decreased   Concentration:  Concentration: Fair and Attention Span: Fair  Recall:  [employer] of Knowledge:  Fair  Language:  Fair  Akathisia:  No   Handed:  Right  AIMS (if indicated):     Assets:  Desire for Improvement  ADL's:  Intact  Cognition:  WNL  Sleep:  Number of Hours: 1.75    Assessment - patient remains depressed, sad, ruminative, and presenting with vague but significant paranoia/ guarded , suspicious demeanor. Denies suicidal ideations .  Tolerating Lexapro, Abilify well, and at this time states he will accept, comply with Abilify prescription as well    Treatment Plan Summary: the stressors.  MDD (major depressive disorder), single episode, severe with psychotic features ([place])  Daily contact with patient to assess and evaluate symptoms and progress in treatment and Medication management  Reviewed past medical records,treatment plan.   For Depression: Continue Lexapro 10 mg po daily at 1800.   For psychosis: Continue Abilify to 10  mg po qhs.  For insomnia: ContinueTrazodone 50 mg po qhs prn as needed   For anxiety/agitation: PRN medications as per unit protocol.  Continue to encourage increased group and milieu participation to work on coping skills and symptom reduction Treatment team working on disposition planning options. Lexapro 10 mg dosing time changes from a.m. To evening time at 1800.  Encarnacion Slates, NP, PMHNP, FNP-BC 10/07/2016, 2:21 PM   Patient ID: Rivan Siordia, male   DOB: Jun 17, 1966, 50 y.o.   MRN: [id number] Patient ID: Hilary Pundt, male   DOB: 11/11/1965, 50 y.o.   MRN: [id number]  Agree with NP progress note as above  Neita Garnet, MD

## 2016-10-07 NOTE — Progress Notes (Signed)
D: Patient denies SI/HI and A/V hallucinations; patient stated " Im feeling really paranoid and im sorry"  A: Monitored q 15 minutes; patient encouraged to attend groups; patient educated about medications; patient given medications per physician orders; patient encouraged to express feelings and/or concerns  R: Patient had to have a a show of force to leave the cafeteria and return to his room; Patient did not take medications at the scheduled time because he kept saying " I need to talk to my wife first" ( this happened at least 3 times); patient finally came to the window and asked for Abilify but it was not scheduled for this time; patient asked for the ativan but then left and called his wife again and tried to push on the door to leave; Clinical research associatewriter asked the patient if he still wanted the ativan and the patient took the medicine; patient eventually came back after over an hour and asked for his scheduled medications; patient's interaction with staff and peers is minimal because he is suspicious and paranoid;

## 2016-10-07 NOTE — [facility] Group Notes (Signed)
[facility] Group Notes:  (Counselor/Nursing/MHT/Case Management/Adjunct)  10/07/2016 1:15PM  Type of Therapy:  Group Therapy  Participation Level:  Active  Participation Quality:  Appropriate  Affect:  Flat  Cognitive:  Oriented  Insight:  Improving  Engagement in Group:  Limited  Engagement in Therapy:  Limited  Modes of Intervention:  Discussion, Exploration and Socialization  Summary of Progress/Problems: The topic for group was balance in life.  Pt participated in the discussion about when their life was in balance and out of balance and how this feels.  Pt discussed ways to get back in balance and short term goals they can work on to get where they want to be.  In and out a couple of times.  There for the most part, and engaged.  Drew a balance beam on the dry erase board to show the tenuous nature of his "balance."  Stated that in this moment he feels grounded, which is how he knows he is currently balanced. Talked about being outside in nature, exercising and listening to music as things that help him find balance.   Daryel Geraldorth, Hollin Crewe B 10/07/2016 2:16 PM

## 2016-10-07 NOTE — Progress Notes (Signed)
Patient was in the cafeteria talking to [place]. Pt was trying to trying to calm the patient down. Landry asked Loletta SpecterCorbin why are you so angry? I asked [name] to go back to the unit he's finish eating. Kaylin sat at a different table talking to himself and he began to count 1,12,3,4,5. I called security for help. Security tried to talk to Peter Kiewit Sonslexander and Corbin. Dearion finally got up and came back to the unit. RN was notify

## 2016-10-07 NOTE — Progress Notes (Signed)
Recreation Therapy Notes  Date: 10/07/16 Time: 1000 Location: 500 Hall Dayroom  Group Topic: Anger Management  Goal Area(s) Addresses:  Patient will identify triggers for anger.  Patient will identify physical reaction to anger.   Patient will identify benefit of using coping skills when angry.  Intervention: Anger worksheet, pencils  Activity: Anger Umbrella.  Patients were given a worksheet with an umbrella on it.  Inside to umbrella, patients were to write out the emotions that cause then to get angry.  If patients couldn't think of the emotions that cause them to get angry, they were to write out the things/situations that cause them to get angry.  Patients were then to identify coping skills they could use to deal with their anger and write them on the outside of the umbrella.  Education: Anger Management, Discharge Planning   Education Outcome: Acknowledges education/In group clarification offered/Needs additional education.   Clinical Observations/Feedback: Pt did not attend group.   Caroll RancherMarjette Brax Walen, LRT/CTRS          Caroll RancherLindsay, Rio Kidane A 10/07/2016 11:37 AM

## 2016-10-08 MED ORDER — LORAZEPAM 2 MG/ML IJ SOLN: 1.0000 mg | Freq: Four times a day (QID) | INTRAMUSCULAR | Status: DC | PRN

## 2016-10-08 MED ORDER — LORAZEPAM 1 MG PO TABS
1.0000 mg | ORAL_TABLET | Freq: Four times a day (QID) | ORAL | Status: DC | PRN
  Administered 2016-10-09 – 2016-10-13 (×8): 1 mg via ORAL
  Filled 2016-10-08 (×9): qty 1

## 2016-10-08 MED ORDER — ESCITALOPRAM OXALATE 5 MG PO TABS
15.0000 mg | ORAL_TABLET | Freq: Every day | ORAL | Status: DC
  Administered 2016-10-08 – 2016-10-12 (×5): 15 mg via ORAL
  Filled 2016-10-08 (×8): qty 3

## 2016-10-08 MED ORDER — ARIPIPRAZOLE 15 MG PO TABS
15.0000 mg | ORAL_TABLET | Freq: Every day | ORAL | Status: DC
  Administered 2016-10-08 – 2016-10-12 (×4): 15 mg via ORAL
  Filled 2016-10-08 (×7): qty 1

## 2016-10-08 NOTE — [facility] Group Notes (Signed)
[facility] LCSW Group Therapy  10/08/2016  1:05 PM  Type of Therapy:  Group therapy  Participation Level:  Active  Participation Quality:  Attentive  Affect:  Flat  Cognitive:  Oriented  Insight:  Limited  Engagement in Therapy:  Limited  Modes of Intervention:  Discussion, Socialization  Summary of Progress/Problems:  Chaplain was here to lead a group on themes of hope and courage.Tried to attend and stay.  Kept moving from chair to chair.  Finally excused himself and left.  Daryel Geraldorth, May Manrique B 10/08/2016 1:56 PM

## 2016-10-08 NOTE — Plan of Care (Signed)
Problem: Nutritional: Goal: Ability to achieve adequate nutritional intake will improve Outcome: Progressing Pt ate 100 of breakfast and lunch per MHT staff. Tolerates all PO intake well thus far.  Problem: Safety: Goal: Ability to remain free from injury will improve Outcome: Progressing Q 15 minutes safety checks remains effective on and off unit without self harm gestures or outburst to note at present.

## 2016-10-08 NOTE — Progress Notes (Signed)
Recreation Therapy Notes  Date: 10/08/16 Time: 1000 Location: 500 Hall Dayroom  Group Topic: Self-Expression  Goal Area(s) Addresses:  Patient will identify positive ways to express themselves. Patient will verbalize benefit of increased self-expression.  Intervention: Markers, construction paper  Activity:  Holiday Cards.  Patients were given markers and construction paper to create a holiday card for themselves or someone special to them.  Education:  Self-Expression, Discharge Planning.   Education Outcome: Acknowledges education/In group clarification offered/Needs additional education  Clinical Observations/Feedback: Pt did not attend group.   Tisha Cline, LRT/CTRS         Joselin Crandell A 10/08/2016 12:02 PM 

## 2016-10-08 NOTE — Progress Notes (Signed)
[facility] MD Progress Note  10/08/2016 4:36 PM Jerry Thornton  MRN:  [id number] Subjective: Patient continues to report depression . Denies suicidal ideations .Denies medication side effects.  Objective:  I have met with patient and have discussed case with treatment team . Patient remains depressed, and presents with constricted, anxious affect. There is some improvement noted- he is better groomed, has improved eye contact, and is somewhat more verbal. Today expressed concerns about what his diagnosis was and worried he might have " schizophrenia". Appeared reassured with review of his diagnosis of severe depression, and with review of current medication regimen. Although he denies hallucinations and does not appear internally preoccupied, he expresses worries about whether his family ( wife , children, sister ) are "OK", and a fear that his sister might be dead. He cannot elaborate on this . He gave me consent to contact his wife via phone but she was unavailable. Appears better able to respond to reassurance and support. Denies suicidal ideations or self injurious ideations,  but states " I feel like I am already dead". He does endorse having " reasons to live", and identifies love for his children.   Patient remains isolative, withdrawn, but has started to be more visible on unit, and today went to group, but was not able to remain through the duration.    Principal Problem: MDD (major depressive disorder), single episode, severe with psychotic features ([place]) Diagnosis:   Patient Active Problem List   Diagnosis Date Noted  . Generalized anxiety disorder [F41.1] 10/03/2016  . MDD (major depressive disorder), single episode, severe with psychotic features ([place]) [F32.3] 10/03/2016   Total Time spent with patient: 20 minutes   Past Psychiatric History: Please see H&P.   Past Medical History:  Past Medical History:  Diagnosis Date  . Allergy   . Depression    History reviewed. No  pertinent surgical history. Family History:  Family History  Problem Relation Age of Onset  . Mental illness Father   . Heart disease Paternal Grandfather   . Mental illness Brother   . Suicidality Brother    Family Psychiatric  History: father has depression, his brother shot self and died 2 and 1/2 yrs ago. Social History: Please see H&P.  History  Alcohol Use  . Yes    Comment: 2 drinks/week     History  Drug Use No    Social History   Social History  . Marital status: Married    Spouse name: N/A  . Number of children: N/A  . Years of education: N/A   Social History Main Topics  . Smoking status: Never Smoker  . Smokeless tobacco: Never Used  . Alcohol use Yes     Comment: 2 drinks/week  . Drug use: No  . Sexual activity: Yes   Other Topics Concern  . None   Social History Narrative  . None   Additional Social History:    Pain Medications: none Prescriptions: none Over the Counter: none History of alcohol / drug use?: No history of alcohol / drug abuse  Sleep: fair   Appetite:  Fair  Current Medications: Current Facility-Administered Medications  Medication Dose Route Frequency Provider Last Rate Last Dose  . acetaminophen (TYLENOL) tablet 650 mg  650 mg Oral Q6H PRN Benjamine Mola, FNP      . alum & mag hydroxide-simeth (MAALOX/MYLANTA) 200-200-20 MG/5ML suspension 30 mL  30 mL Oral Q4H PRN Benjamine Mola, FNP      . ARIPiprazole (ABILIFY) tablet  10 mg  10 mg Oral QHS Jenne Campus, MD   10 mg at 10/07/16 2123  . escitalopram (LEXAPRO) tablet 15 mg  15 mg Oral QHS Keyarra Rendall A Tishawna Larouche, MD      . LORazepam (ATIVAN) tablet 0.5 mg  0.5 mg Oral Q6H PRN Ursula Alert, MD   0.5 mg at 10/08/16 1355   Or  . LORazepam (ATIVAN) injection 0.5 mg  0.5 mg Intramuscular Q6H PRN Saramma Eappen, MD      . magnesium hydroxide (MILK OF MAGNESIA) suspension 30 mL  30 mL Oral Daily PRN Benjamine Mola, FNP      . traZODone (DESYREL) tablet 100 mg  100 mg Oral QHS PRN,MR  X 1 Saramma Eappen, MD   100 mg at 10/07/16 2123  . ziprasidone (GEODON) capsule 20 mg  20 mg Oral TID PRN Ursula Alert, MD   20 mg at 10/07/16 1054   Or  . ziprasidone (GEODON) injection 10 mg  10 mg Intramuscular TID PRN Ursula Alert, MD        Lab Results:  No results found for this or any previous visit (from the past 48 hour(s)).  Blood Alcohol level:  Lab Results  Component Value Date   ETH <5 11/57/2620    Metabolic Disorder Labs: Lab Results  Component Value Date   HGBA1C 5.0 10/05/2016   MPG 97 10/05/2016   Lab Results  Component Value Date   PROLACTIN 10.8 10/05/2016   Lab Results  Component Value Date   CHOL 190 10/05/2016   TRIG 108 10/05/2016   HDL 59 10/05/2016   CHOLHDL 3.2 10/05/2016   VLDL 22 10/05/2016   LDLCALC 109 (H) 10/05/2016    Physical Findings: AIMS:  , ,  ,  ,    CIWA:    COWS:     Musculoskeletal: Strength & Muscle Tone: within normal limits Gait & Station: normal Patient leans: N/A  Psychiatric Specialty Exam: Physical Exam  Nursing note and vitals reviewed.   Review of Systems  Psychiatric/Behavioral: Positive for depression. The patient is nervous/anxious and has insomnia.   All other systems reviewed and are negative.   Blood pressure 120/79, pulse 96, temperature 98.7 F (37.1 C), temperature source Oral, resp. rate 18, height _0  (1.88 m), weight 97.5 kg (215 lb).Body mass index is 27.6 kg/m.  General Appearance: improved grooming   Eye Contact:  Good   Speech:  Normal Rate  Volume:  Soft   Mood:  Remains depressed   Affect: remains constricted, anxious, but less guarded  Thought Process: presents linear , slowed   Orientation:  Full (Time, Place, and Person)  Thought Content: does not appear internally preoccupied, expresses vague fears about his family's well being and concerns his sister may have died, but does not elaborate.  Suicidal Thoughts:  No denies suicidal or self injurious ideations , contracts for  safety on the unit   Homicidal Thoughts:  No denies any violent or  homicidal ideations   Memory:  Immediate;   Fair Recent;   Fair Remote;   Fair  Judgement:  Fair   Insight:  Fair   Psychomotor Activity:  Decreased   Concentration:  Concentration: improving  and Attention Span: improving   Recall:  [employer] of Knowledge:  Fair  Language:  Fair  Akathisia:  No  Handed:  Right  AIMS (if indicated):     Assets:  Desire for Improvement  ADL's:  Fair, but improving   Cognition:  WNL  Sleep:  Number of Hours: 5.75    Assessment - patient is presenting depressed, constricted in affect , anxious, but better groomed, more interactive with improving rate of speech, and less suspicious , guarded. He denies hallucinations, but appears ruminative about his family's well being . At this time denies suicidal ideations. Denies medication side effects   Treatment Plan Summary:   MDD (major depressive disorder), single episode, severe with psychotic features ([place])   Daily contact with patient to assess and evaluate symptoms and progress in treatment and Medication management  Reviewed past medical records,treatment plan.   For Depression: Increase Lexapro to 15 mg po daily.   For psychosis: Increase Abilify to 15  mg po qhs.  For insomnia: Trazodone 100 mg po qhs prn as needed   For anxiety/agitation: PRN medications as per unit protocol.  Continue to encourage increased group and milieu participation to work on coping skills and symptom reduction Treatment team working on disposition planning options   Neita Garnet, MD 10/08/2016, 4:36 PM   Patient ID: Jerry Thornton, male   DOB: 1965/11/15, 50 y.o.   MRN: [id number]

## 2016-10-08 NOTE — Progress Notes (Signed)
Patient ID: Jerry Thornton, male   DOB: [birth year], 50 y.o.   MRN: [id number]  Pt currently presents with a flat affect and cooperative, guarded behavior. Pt slow to process short commands. Pt reports to Clinical research associatewriter that their goal is to "set up a meeting with Dr. Jama Flavorsobos, my wife and my brother about my medications and my care when I leave." Pt states "I don't process things that I hear very well, it is easier for me to read and then reread the information." Pt concerns about medications and side effects. Pt reports good sleep with current medication regimen.   Pt provided with medications per providers orders. Pt's labs and vitals were monitored throughout the night. Pt given a 1:1, supported emotionally and encouraged to express concerns and questions. Pt educated verbally on medications, potential side effects and proper medication management. Given written material as well.   Pt's safety ensured with 15 minute and environmental checks. Pt currently denies SI/HI and A/V hallucinations. Pt verbally agrees to seek staff if SI/HI or A/VH occurs and to consult with staff before acting on any harmful thoughts. Will continue POC.

## 2016-10-08 NOTE — Progress Notes (Signed)
D: Pt is flat isolative and withdrawn to room: Pt was in bed all night. Pt at the time of assessment denied depression, anxiety, pain, SI, HI or AVH. Pt could be paranoid; states, "Why are you asking all these questions? Is there something going on?" Pt quickly sit-up in bed whenever his door his opened.  A: Medications offered as prescribed.  Support, encouragement, and safe environment provided.  15-minute safety checks continue. R: Pt was med compliant.  Pt did not attend wrap-up group. Safety checks continue.

## 2016-10-08 NOTE — Progress Notes (Signed)
D: Pt presents with depressed affect and mood. Pt denies SI, HI, AVH and pain. However, pt does appears very anxious, restless. Observed with increased pacing in the hall, took his clothes off at a time and was pacing with only his boxers on. Pt was also crying earlier this shift about his dead brother "I wish I had helped him, I didn't know he was that sick". Pt was unaware about pacing in his boxers earlier this shift during PRN reassessment; stated to writer "sorry I did that, I can't even remember, please call me to attention and help me please".  Pt remains confused with tangential speech, frequently looses his train of thought but is verbally redirectable at this time. A: Scheduled and PRN medications administered as prescribed with verbal education. Emotional support and availability provided to pt. Encouraged to voice concerns, attend groups and comply with current treatment regimen. Q 15 minutes checks continues  without outburst or self harm gestures. R: Pt receptive to care. Compliant with medications when offered. Denies adverse drug reactions when assessed. Attended noon unit groups but not AM group. Tolerates all PO intake well. Pt remains safe on and off unit. POC continues.

## 2016-10-09 NOTE — Progress Notes (Signed)
[facility] MD Progress Note  10/09/2016 11:43 AM Jerry Thornton  MRN:  [id number]  Subjective: Harlin continues to report symptoms of depression, anxiety & excessive worrying. He says today he is worried about his sister, Sabino Snipes, because she travels a lot. Wants to know if she is okay. Patient called & talked to wife who assured him that all is okay with his family. Denies suicidal ideations. Complains of tingling sensation to hands & feet.  Objective:  I have met with patient and have discussed case with treatment team . Patient remains depressed, and presents with constricted, anxious affect. There is some improvement noted- he is better groomed, has improved eye contact, and is somewhat more verbal. Today expressed concerns about his family's wellbeing, particularly his sister Sabino Snipes who travels a lot. Appeared reassured with a phone call to his wife who assured him that his family is doing well. Appears better able to respond to reassurance and support. Denies suicidal ideations or self injurious ideations. He does endorse having " reasons to live", and identifies love for his children.   Patient remains isolative, withdrawn, but has started to be more visible on unit.  Principal Problem: MDD (major depressive disorder), single episode, severe with psychotic features ([place]) Diagnosis:   Patient Active Problem List   Diagnosis Date Noted  . Generalized anxiety disorder [F41.1] 10/03/2016  . MDD (major depressive disorder), single episode, severe with psychotic features ([place]) [F32.3] 10/03/2016   Total Time spent with patient: 20 minutes   Past Psychiatric History: Please see H&P.   Past Medical History:  Past Medical History:  Diagnosis Date  . Allergy   . Depression    History reviewed. No pertinent surgical history. Family History:  Family History  Problem Relation Age of Onset  . Mental illness Father   . Heart disease Paternal Grandfather   . Mental illness Brother   .  Suicidality Brother    Family Psychiatric  History: father has depression, his brother shot self and died 2 and 1/2 yrs ago. Social History: Please see H&P.  History  Alcohol Use  . Yes    Comment: 2 drinks/week     History  Drug Use No    Social History   Social History  . Marital status: Married    Spouse name: N/A  . Number of children: N/A  . Years of education: N/A   Social History Main Topics  . Smoking status: Never Smoker  . Smokeless tobacco: Never Used  . Alcohol use Yes     Comment: 2 drinks/week  . Drug use: No  . Sexual activity: Yes   Other Topics Concern  . None   Social History Narrative  . None   Additional Social History:    Pain Medications: none Prescriptions: none Over the Counter: none History of alcohol / drug use?: No history of alcohol / drug abuse  Sleep: fair   Appetite:  Fair  Current Medications: Current Facility-Administered Medications  Medication Dose Route Frequency Provider Last Rate Last Dose  . acetaminophen (TYLENOL) tablet 650 mg  650 mg Oral Q6H PRN Benjamine Mola, FNP      . alum & mag hydroxide-simeth (MAALOX/MYLANTA) 200-200-20 MG/5ML suspension 30 mL  30 mL Oral Q4H PRN Benjamine Mola, FNP      . ARIPiprazole (ABILIFY) tablet 15 mg  15 mg Oral QHS Jenne Campus, MD   15 mg at 10/08/16 2158  . escitalopram (LEXAPRO) tablet 15 mg  15 mg Oral QHS Myer Peer  Janssen Zee, MD   15 mg at 10/08/16 1712  . LORazepam (ATIVAN) tablet 1 mg  1 mg Oral Q6H PRN Jenne Campus, MD   1 mg at 10/09/16 2800   Or  . LORazepam (ATIVAN) injection 1 mg  1 mg Intramuscular Q6H PRN Myer Peer Binnie Droessler, MD      . magnesium hydroxide (MILK OF MAGNESIA) suspension 30 mL  30 mL Oral Daily PRN Benjamine Mola, FNP      . traZODone (DESYREL) tablet 100 mg  100 mg Oral QHS PRN,MR X 1 Saramma Eappen, MD   100 mg at 10/08/16 2158  . ziprasidone (GEODON) capsule 20 mg  20 mg Oral TID PRN Ursula Alert, MD   20 mg at 10/07/16 1054   Or  . ziprasidone  (GEODON) injection 10 mg  10 mg Intramuscular TID PRN Ursula Alert, MD       Lab Results:  No results found for this or any previous visit (from the past 48 hour(s)).  Blood Alcohol level:  Lab Results  Component Value Date   ETH <5 34/91/7915   Metabolic Disorder Labs: Lab Results  Component Value Date   HGBA1C 5.0 10/05/2016   MPG 97 10/05/2016   Lab Results  Component Value Date   PROLACTIN 10.8 10/05/2016   Lab Results  Component Value Date   CHOL 190 10/05/2016   TRIG 108 10/05/2016   HDL 59 10/05/2016   CHOLHDL 3.2 10/05/2016   VLDL 22 10/05/2016   LDLCALC 109 (H) 10/05/2016   Physical Findings: AIMS:  , ,  ,  ,    CIWA:    COWS:     Musculoskeletal: Strength & Muscle Tone: within normal limits Gait & Station: normal Patient leans: N/A  Psychiatric Specialty Exam: Physical Exam  Nursing note and vitals reviewed.   Review of Systems  Psychiatric/Behavioral: Positive for depression. The patient is nervous/anxious and has insomnia.   All other systems reviewed and are negative.   Blood pressure 98/70, pulse 99, temperature 97.9 F (36.6 C), resp. rate 20, height '6\' 2"'  (1.88 m), weight 97.5 kg (215 lb).Body mass index is 27.6 kg/m.  General Appearance: improved grooming   Eye Contact:  Good   Speech:  Normal Rate  Volume:  Soft   Mood:  Remains depressed   Affect: remains constricted, anxious, but less guarded  Thought Process: presents linear , slowed   Orientation:  Full (Time, Place, and Person)  Thought Content: does not appear internally preoccupied, expresses vague fears about his family's well being and concerns his sister may have died, but does not elaborate.  Suicidal Thoughts:  No denies suicidal or self injurious ideations , contracts for safety on the unit   Homicidal Thoughts:  No denies any violent or  homicidal ideations   Memory:  Immediate;   Fair Recent;   Fair Remote;   Fair  Judgement:  Fair   Insight:  Fair   Psychomotor  Activity:  Decreased   Concentration:  Concentration: improving  and Attention Span: improving   Recall:  [employer] of Knowledge:  Fair  Language:  Fair  Akathisia:  No  Handed:  Right  AIMS (if indicated):     Assets:  Desire for Improvement  ADL's:  Fair, but improving   Cognition:  WNL  Sleep:  Number of Hours: 4.75    Assessment - patient is presenting depressed, constricted in affect , anxious, but better groomed, more interactive with improving rate of speech, and  less suspicious , guarded. He denies hallucinations, but appears ruminative about his family's well being . At this time denies suicidal ideations. Denies medication side effects   Treatment Plan Summary:   MDD (major depressive disorder), single episode, severe with psychotic features ([place])   Daily contact with patient to assess and evaluate symptoms and progress in treatment and Medication management  Reviewed past medical records,treatment plan.   For Depression: Continue Lexapro 15 mg po daily.  For psychosis: Continue Abilify 15  mg po qhs.  For insomnia: Trazodone 100 mg po qhs prn as needed   For anxiety/agitation: PRN medications as per unit protocol.  Continue to encourage increased group and milieu participation to work on coping skills and symptom reduction Treatment team working on disposition planning options  Encarnacion Slates, NP, PMHNP, FNP-BC 10/09/2016, 11:43 AM   Patient ID: Jerry Thornton, male   DOB: February 02, 1966, 50 y.o.   MRN: [id number] Agree with NP progress note as above  Neita Garnet, MD

## 2016-10-09 NOTE — [facility] Group Notes (Signed)
[facility] Group Notes:  (Clinical Social Work)  10/09/2016  1:15-2:00PM  Summary of Progress/Problems:   Today's process group involved patients discussing their feelings related to being hospitalized over the holiday season.  They were given the opportunity to share any memories, negative or positive, about holidays past.  The patient expressed that his desire is to go home for the holidays.  He shared that his brother had almost died as a child when he choked, but his father had saved him.  However, 2 years ago his brother committed suicide and Christmas was the last time he saw him.  He was quite tearful during this time.  He stated that something which could be helpful to him in the next day or two would be to "go outside."     Type of Therapy:  Group Therapy - Process  Participation Level:  Active  Participation Quality:  Attentive, Sharing and Supportive  Affect:  Blunted and Depressed and Tearful  Cognitive:  Oriented  Insight:  Improving  Engagement in Therapy:  Developing/Improving  Modes of Intervention:  Exploration, Discussion  Ambrose MantleMareida Grossman-Orr, LCSW 10/09/2016, 2:17 PM

## 2016-10-09 NOTE — Progress Notes (Signed)
Patient ID: Jerry Thornton, male   DOB: March 19, 1966, 50 y.o.   MRN: [id number]   D:Pt has been very flat and depressed on the unit today. Pt has been isolating himself most of the time. Pt denies physical pain, took all his meds as scheduled. As per self inventory, pt had a good night sleep, good appetite, normal energy, and good concentration. Pt rate depression at 0, hopeless ness at 0, and anxiety at 4. Pt reported being negative SI/HI, no AH/VH noted. A: 15 min checks continued for patient safety. R: Pt safety maintained.

## 2016-10-09 NOTE — Progress Notes (Signed)
Pt refused 2200 Abilify. He claimed he was told that the only schedule medication he gets is Lexapro.

## 2016-10-10 NOTE — Progress Notes (Signed)
The focus of this group is to help patients review their daily goal of treatment and discuss progress on daily workbooks. Pt attended the evening group session and responded to all discussion prompts from the Writer. Pt shared that today was an "okay" day on the unit, the highlight of which was a good group he attended led by the clinical social worker. "What she said just resonated with me"  Pt rated his day a 3 out of 10 and his affect was appropriate. Pt reported having no additional needs from Nursing Staff this evening.

## 2016-10-10 NOTE — Progress Notes (Signed)
[facility] MD Progress Note  10/10/2016 12:53 PM Erman Thum  MRN:  [id number]  Subjective: Lynk reports today that it's going well. My wife came by last evening to visit me. It was a good visit that lasted for about 30 minutes".  Objective:  I have met with patient and have discussed case with treatment team . Patient remains depressed, and presents with constricted, anxious affect. He has a visit from wife last evening that went well. There is some improvement noted- he is better groomed, has improved eye contact, and is somewhat more verbal. He still expressed concerns about his family's wellbeing, particularly his sister Sabino Snipes who travels a lot. Appeared reassured with a phone call to his wife who assured him that his family is doing well. Appears better able to respond to reassurance and support. Denies suicidal ideations or self injurious ideations. He does endorse having " reasons to live", and identifies love for his children.   Patient remains isolative, withdrawn, but has started to be more visible on unit.  Principal Problem: MDD (major depressive disorder), single episode, severe with psychotic features ([place]) Diagnosis:   Patient Active Problem List   Diagnosis Date Noted  . Generalized anxiety disorder [F41.1] 10/03/2016  . MDD (major depressive disorder), single episode, severe with psychotic features ([place]) [F32.3] 10/03/2016   Total Time spent with patient: 15 minutes   Past Psychiatric History: Please see H&P.   Past Medical History:  Past Medical History:  Diagnosis Date  . Allergy   . Depression    History reviewed. No pertinent surgical history. Family History:  Family History  Problem Relation Age of Onset  . Mental illness Father   . Heart disease Paternal Grandfather   . Mental illness Brother   . Suicidality Brother    Family Psychiatric  History: Father has depression, his brother shot self and died 2 and 1/2 yrs ago. Social History: Please see  H&P.  History  Alcohol Use  . Yes    Comment: 2 drinks/week     History  Drug Use No    Social History   Social History  . Marital status: Married    Spouse name: N/A  . Number of children: N/A  . Years of education: N/A   Social History Main Topics  . Smoking status: Never Smoker  . Smokeless tobacco: Never Used  . Alcohol use Yes     Comment: 2 drinks/week  . Drug use: No  . Sexual activity: Yes   Other Topics Concern  . None   Social History Narrative  . None   Additional Social History:    Pain Medications: none Prescriptions: none Over the Counter: none History of alcohol / drug use?: No history of alcohol / drug abuse  Sleep: Fair   Appetite:  Fair  Current Medications: Current Facility-Administered Medications  Medication Dose Route Frequency Provider Last Rate Last Dose  . acetaminophen (TYLENOL) tablet 650 mg  650 mg Oral Q6H PRN Benjamine Mola, FNP      . alum & mag hydroxide-simeth (MAALOX/MYLANTA) 200-200-20 MG/5ML suspension 30 mL  30 mL Oral Q4H PRN Benjamine Mola, FNP      . ARIPiprazole (ABILIFY) tablet 15 mg  15 mg Oral QHS Jenne Campus, MD   15 mg at 10/08/16 2158  . escitalopram (LEXAPRO) tablet 15 mg  15 mg Oral QHS Jenne Campus, MD   15 mg at 10/09/16 1702  . LORazepam (ATIVAN) tablet 1 mg  1 mg Oral Q6H  PRN Jenne Campus, MD   1 mg at 10/09/16 4103   Or  . LORazepam (ATIVAN) injection 1 mg  1 mg Intramuscular Q6H PRN Myer Peer Maryrose Colvin, MD      . magnesium hydroxide (MILK OF MAGNESIA) suspension 30 mL  30 mL Oral Daily PRN Benjamine Mola, FNP      . traZODone (DESYREL) tablet 100 mg  100 mg Oral QHS PRN,MR X 1 Saramma Eappen, MD   100 mg at 10/08/16 2158  . ziprasidone (GEODON) capsule 20 mg  20 mg Oral TID PRN Ursula Alert, MD   20 mg at 10/07/16 1054   Or  . ziprasidone (GEODON) injection 10 mg  10 mg Intramuscular TID PRN Ursula Alert, MD       Lab Results:  No results found for this or any previous visit (from the past  48 hour(s)).  Blood Alcohol level:  Lab Results  Component Value Date   ETH <5 11/17/4386   Metabolic Disorder Labs: Lab Results  Component Value Date   HGBA1C 5.0 10/05/2016   MPG 97 10/05/2016   Lab Results  Component Value Date   PROLACTIN 10.8 10/05/2016   Lab Results  Component Value Date   CHOL 190 10/05/2016   TRIG 108 10/05/2016   HDL 59 10/05/2016   CHOLHDL 3.2 10/05/2016   VLDL 22 10/05/2016   LDLCALC 109 (H) 10/05/2016   Physical Findings: AIMS:  , ,  ,  ,    CIWA:    COWS:     Musculoskeletal: Strength & Muscle Tone: within normal limits Gait & Station: normal Patient leans: N/A  Psychiatric Specialty Exam: Physical Exam  Nursing note and vitals reviewed.   Review of Systems  Psychiatric/Behavioral: Positive for depression. The patient is nervous/anxious and has insomnia.   All other systems reviewed and are negative.   Blood pressure 128/73, pulse 95, temperature 98.5 F (36.9 C), resp. rate 18, height '6\' 2"'  (1.88 m), weight 97.5 kg (215 lb).Body mass index is 27.6 kg/m.  General Appearance: improved grooming   Eye Contact:  Good   Speech:  Normal Rate  Volume:  Soft   Mood:  Remains depressed   Affect: remains constricted, anxious, but less guarded  Thought Process: presents linear , slowed   Orientation:  Full (Time, Place, and Person)  Thought Content: does not appear internally preoccupied, expresses vague fears about his family's well being and concerns his sister may have died, but does not elaborate.  Suicidal Thoughts:  No denies suicidal or self injurious ideations , contracts for safety on the unit   Homicidal Thoughts:  No denies any violent or  homicidal ideations   Memory:  Immediate;   Fair Recent;   Fair Remote;   Fair  Judgement:  Fair   Insight:  Fair   Psychomotor Activity:  Decreased   Concentration:  Concentration: improving  and Attention Span: improving   Recall:  [employer] of Knowledge:  Fair  Language:  Fair   Akathisia:  No  Handed:  Right  AIMS (if indicated):     Assets:  Desire for Improvement  ADL's:  Fair, but improving   Cognition:  WNL  Sleep:  Number of Hours: 5.5    Assessment - patient is presenting depressed, constricted in affect , anxious, but better groomed, more interactive with improving rate of speech, and less suspicious , guarded. He denies hallucinations, but appears ruminative about his family's well being . At this time denies suicidal  ideations. Denies medication side effects  Treatment Plan Summary:   MDD (major depressive disorder), single episode, severe with psychotic features ([place])  Daily contact with patient to assess and evaluate symptoms and progress in treatment and Medication management  Reviewed past medical records,treatment plan.   For Depression: Continue Lexapro 15 mg po daily.  For psychosis: Continue Abilify 15  mg po qhs.  For insomnia: Trazodone 100 mg po qhs prn as needed   For anxiety/agitation: PRN medications as per unit protocol.  Continue to encourage increased group and milieu participation to work on coping skills and symptom reduction Treatment team working on disposition planning options  Encarnacion Slates, NP, PMHNP, FNP-BC 10/10/2016, 12:53 PM  Patient ID: Taren Toops, male   DOB: 1966-10-05, 50 y.o.   MRN: [id number] Agree with NP progress note as above

## 2016-10-10 NOTE — Progress Notes (Addendum)
D) Pt approached this writer stating "I feel like something is going wrong with my heart. I feel like there is pressure pressing down on me and my hands and feet are tingling. I want to go to the hospital so that a medical doctor can assess my heart". In a 1:1 talked with Pt about his anxiety. Pt was tearful and verbalizing concerns and worries that "Have I hurt someone? I don't understand why my wife and my father in law want to keep me here".  A) Engaged Pt in a 1:1 where we talked about the fact that he has a chemical imbalance. That his father was a depressed person, his brother and he. Talked with Pt honestly and explained that this same thing has happened twice before. When he is very stressed, his anxiety increases and his reality gets skewed. R) Pt was willing and did take some Ativan 1 mg at 1548. Sitting in the dayroom presently watching the football game. Addendum: Pt wrote in his  Patient self inventory that he had had thoughts of SI today. Pt denies SI presently. States he is feeling a little groggy at this point. Is contracting for his safety.

## 2016-10-10 NOTE — [facility] Group Notes (Signed)
Adult Therapy Group Note  Date:  10/10/2016 Time: 11:00-11:30AM  Group Topic/Focus:  In group today we briefly listened to music that patients requested, and they shared what each song meant to them.   Participation Level:  Minimal  Participation Quality:  Inattentive  Affect:  Flat and Anxious  Cognitive:  Disorganized  Insight: Lacking  Engagement in Group:  Lacking  Modes of Intervention:  Activity and Exploration  Additional Comments:  Pt was in and out of the room numerous times, seemed quite anxious.  Carloyn JaegerMareida J Grossman-Orr 10/10/2016, 1:45 PM

## 2016-10-11 MED ORDER — TRAZODONE HCL 100 MG PO TABS
100.0000 mg | ORAL_TABLET | Freq: Every evening | ORAL | Status: DC | PRN
  Administered 2016-10-11 – 2016-10-12 (×2): 100 mg via ORAL
  Filled 2016-10-11 (×2): qty 1

## 2016-10-11 NOTE — Plan of Care (Signed)
Problem: Activity: Goal: Will verbalize the importance of balancing activity with adequate rest periods Outcome: Progressing Pt verbalized understanding with regards to getting adequate rest to help his thought process and prevent psychotic relapses.

## 2016-10-11 NOTE — Progress Notes (Signed)
D: Pt denies SI/HI/AVH. Pt is pleasant and cooperative. Pt stated he has been very anxious today. Pt was shown various breathing techniques by writer and pt appeared to be more relaxed after trying some.   A: Pt was offered support and encouragement. Pt was given scheduled medications. Pt was encourage to attend groups. Q 15 minute checks were done for safety.   R:Pt attends groups and interacts well with peers and staff. Pt is taking medication. Pt has no complaints.Pt receptive to treatment and safety maintained on unit.

## 2016-10-11 NOTE — Progress Notes (Signed)
D: Pt denies SI/HI/AVH. Pt is pleasant and cooperative. Pt continues to be paranoid. Pt stated he just wants to take his medications like he is supposed to. Pt appears to be confused at times on the unit, not knowing if he took certain medications for the day.   A: Pt was offered support and encouragement. Pt was given scheduled medications. Pt was encourage to attend groups. Q 15 minute checks were done for safety.   R: Pt is taking medication. Pt has no complaints at this time .Pt receptive to treatment and safety maintained on unit.

## 2016-10-11 NOTE — Progress Notes (Addendum)
[facility] MD Progress Note  10/11/2016 3:32 PM Jerry Thornton  MRN:  [id number]  Subjective:  Patient reports significant sense of anxiety. Also reports ongoing depression but does feel depression is improving gradually. At present does not endorse medication side effects. Denies suicidal ideations.  Objective:  I have met with patient and have discussed case with treatment team . Patient presents partially improved compared to admission - better groomed, more interactive, still vaguely guarded, paranoid, but in general better related . Also , more visible on unit, going to some groups.He continues to present with an anxious affect and at times appearing confused and bewildered . However, today he is fully alert and attentive, fully oriented x 3, recall is 3/3 immediate and 2/3 at 5 minutes, and is able to spell words backwards without difficulty . No evidence of delirium. He denies suicidal ideations, and today identifies his children as protective factor against hurting self. He does ruminate about his brother's death, and states he has a sense of guilt and responsibility about his death.  With his express consent I have spoken via phone to his wife, who has been visiting him regularly - she reports he appears to be slowly improving , but notices increased anxiety. Still far from his baseline .She reports he has been calling often, seeking reassurance that she and the children are OK.    Principal Problem: MDD (major depressive disorder), single episode, severe with psychotic features ([place]) Diagnosis:   Patient Active Problem List   Diagnosis Date Noted  . Generalized anxiety disorder [F41.1] 10/03/2016  . MDD (major depressive disorder), single episode, severe with psychotic features ([place]) [F32.3] 10/03/2016   Total Time spent with patient: 25  minutes   Past Psychiatric History: Please see H&P.   Past Medical History:  Past Medical History:  Diagnosis Date  . Allergy   . Depression     History reviewed. No pertinent surgical history. Family History:  Family History  Problem Relation Age of Onset  . Mental illness Father   . Heart disease Paternal Grandfather   . Mental illness Brother   . Suicidality Brother    Family Psychiatric  History: Father has depression, his brother shot self and died 2 and 1/2 yrs ago. Social History: Please see H&P.  History  Alcohol Use  . Yes    Comment: 2 drinks/week     History  Drug Use No    Social History   Social History  . Marital status: Married    Spouse name: N/A  . Number of children: N/A  . Years of education: N/A   Social History Main Topics  . Smoking status: Never Smoker  . Smokeless tobacco: Never Used  . Alcohol use Yes     Comment: 2 drinks/week  . Drug use: No  . Sexual activity: Yes   Other Topics Concern  . None   Social History Narrative  . None   Additional Social History:    Pain Medications: none Prescriptions: none Over the Counter: none History of alcohol / drug use?: No history of alcohol / drug abuse  Sleep: improving   Appetite:  Improving   Current Medications: Current Facility-Administered Medications  Medication Dose Route Frequency Provider Last Rate Last Dose  . acetaminophen (TYLENOL) tablet 650 mg  650 mg Oral Q6H PRN Benjamine Mola, FNP      . alum & mag hydroxide-simeth (MAALOX/MYLANTA) 200-200-20 MG/5ML suspension 30 mL  30 mL Oral Q4H PRN Benjamine Mola, FNP      .  ARIPiprazole (ABILIFY) tablet 15 mg  15 mg Oral QHS Jenne Campus, MD   15 mg at 10/10/16 2048  . escitalopram (LEXAPRO) tablet 15 mg  15 mg Oral QHS Jenne Campus, MD   15 mg at 10/10/16 1815  . LORazepam (ATIVAN) tablet 1 mg  1 mg Oral Q6H PRN Jenne Campus, MD   1 mg at 10/11/16 1431   Or  . LORazepam (ATIVAN) injection 1 mg  1 mg Intramuscular Q6H PRN Myer Peer Elizeo Rodriques, MD      . magnesium hydroxide (MILK OF MAGNESIA) suspension 30 mL  30 mL Oral Daily PRN Benjamine Mola, FNP      . traZODone  (DESYREL) tablet 100 mg  100 mg Oral QHS PRN,MR X 1 Saramma Eappen, MD   100 mg at 10/10/16 2217  . ziprasidone (GEODON) capsule 20 mg  20 mg Oral TID PRN Ursula Alert, MD   20 mg at 10/07/16 1054   Or  . ziprasidone (GEODON) injection 10 mg  10 mg Intramuscular TID PRN Ursula Alert, MD       Lab Results:  No results found for this or any previous visit (from the past 48 hour(s)).  Blood Alcohol level:  Lab Results  Component Value Date   ETH <5 00/76/2263   Metabolic Disorder Labs: Lab Results  Component Value Date   HGBA1C 5.0 10/05/2016   MPG 97 10/05/2016   Lab Results  Component Value Date   PROLACTIN 10.8 10/05/2016   Lab Results  Component Value Date   CHOL 190 10/05/2016   TRIG 108 10/05/2016   HDL 59 10/05/2016   CHOLHDL 3.2 10/05/2016   VLDL 22 10/05/2016   LDLCALC 109 (H) 10/05/2016   Physical Findings: AIMS:  , ,  ,  ,    CIWA:    COWS:     Musculoskeletal: Strength & Muscle Tone: within normal limits Gait & Station: normal Patient leans: N/A  Psychiatric Specialty Exam: Physical Exam  Nursing note and vitals reviewed.   Review of Systems  Psychiatric/Behavioral: Positive for depression. The patient is nervous/anxious and has insomnia.   All other systems reviewed and are negative. no chest pain, no shortness of breath at this time  Blood pressure 124/68, pulse 91, temperature 98.1 F (36.7 C), resp. rate 16, height _0  (1.88 m), weight 97.5 kg (215 lb).Body mass index is 27.6 kg/m.  General Appearance: improved grooming   Eye Contact:  Good   Speech:  Normal Rate  Volume:  Soft   Mood:  Partially improved although still depressed   Affect: less guarded, less suspicious, remains anxious   Thought Process: presents linear , slowed   Orientation:  Full (Time, Place, and Person)  Thought Content: denies hallucinations,and does not appear internally preoccupied, no clear delusions identified but reports ongoing preoccupations about his  family's well being, but no over paranoid preoccupations   Suicidal Thoughts:  No denies suicidal or self injurious ideations , contracts for safety on the unit   Homicidal Thoughts:  No denies any violent or  homicidal ideations   Memory: recent and remote improved 3/3 immediate recall and 2/3 at 5 minutes   Judgement:  Gradually improving   Insight:  Gradually improving   Psychomotor Activity:  Improving, more visible on unit   Concentration:  Concentration: improving  and Attention Span: improving   Recall:  [employer] of Knowledge:  Fair  Language:  Fair  Akathisia:  No  Handed:  Right  AIMS (if indicated):     Assets:  Desire for Improvement  ADL's:  Fair, but improving   Cognition:  WNL  Sleep:  Number of Hours: 5.5    Assessment - patient is presenting with partial , gradual improvement- presents better related, somewhat more interactive and participative in milieu, denying any SI. Remains anxious and ruminative . Tolerating medications well . Of note, anxiety does not appear to be related to akathisia, as not restlessness or pacing noted and patient able to sit comfortably though session.  Treatment Plan Summary:   MDD (major depressive disorder), single episode, severe with psychotic features ([place])  Daily contact with patient to assess and evaluate symptoms and progress in treatment and Medication management  Reviewed past medical records,treatment plan.   For Depression: Continue Lexapro 15 mg po daily.  For psychosis: Continue Abilify 15  mg po qhs.  For insomnia: Trazodone 100 mg po qhs PRN for insomnia   For anxiety/agitation: Continue Ativan 1 mgr Q 6 hours PRN for severe anxiety  Continue to encourage increased group and milieu participation to work on coping skills and symptom reduction Treatment team working on disposition planning options  Neita Garnet, MD 10/11/2016, 3:32 PM  Patient ID: Jerry Thornton, male   DOB: 02-07-1966, 50 y.o.   MRN:  [id number]

## 2016-10-11 NOTE — Progress Notes (Signed)
Adult Psychoeducational Group Note  Date:  10/11/2016 Time:  12:11 AM  Group Topic/Focus:  Wrap-Up Group:   The focus of this group is to help patients review their daily goal of treatment and discuss progress on daily workbooks.   Participation Level:  Active  Participation Quality:  Appropriate and Attentive  Affect:  Appropriate  Cognitive:  Alert and Appropriate  Insight: Appropriate  Engagement in Group:  Engaged  Modes of Intervention:  Discussion, Education and Support  Additional Comments:  Pt rated his day at a 1 out of 10. Pt shared that his day started out very bad, turned positive at one point, but then had feelings of anxiety. Pt shared that he felt like he was able to determine why he became anxious and will continue to work on coping day by day. Malachy MoanJeffers, Simaya Lumadue S 10/11/2016, 12:11 AM

## 2016-10-11 NOTE — Progress Notes (Signed)
D: Pt verbalized feelings of paranoia and anxiety at an "11 out of 10". Pt appeared anxious. Having difficulty forming his words and completing a thought. Appears somewhat confused but still pleasant and courteous. When asked what was the source of his anxiety pt stated that he questions if his brother committed suicide or if the boyfriend of his brothers former girlfriend kill him. Asked if this was the single source of his anxiety. Pt stated that there are other thoughts he has that may also be delusional.   A: Provided emotional support and consulting to pt. Reoriented to what's possible and could be delusions. Gave PRN Ativan 1 mg po for extreme anxiety.   R: At 1414 pt in his room laying down without incident and fairly good results from PRN Ativan. Verbalized understanding with regards to need to distinguishing what is real and what are delusional thoughts.

## 2016-10-11 NOTE — Plan of Care (Signed)
Problem: Coping: Goal: Ability to demonstrate self-control will improve Outcome: Progressing Pt remained calm on the unit this evening, pt seen doing breathing techniques learned earlier in the evening

## 2016-10-12 NOTE — Progress Notes (Signed)
Patient ID: Jerry Thornton Basic, male   DOB: October 30, 1965, 50 y.o.   MRN: [id number]    D:Pt has been very flat and depressed on the unit today. Pt has been isolating himself most of the time. Pt denies physical pain, took all his meds as scheduled. As per self inventory, pt had a good night sleep, good appetite, normal energy, and good concentration. Pt rate depression at 10, hopelessness at 10, and anxiety at 10. Pt reported that his goal for today was to slow down, stay calm, and decrease anxiety. Pt reported being negative SI/HI, no AH/VH noted. A: 15 min checks continued for patient safety. R: Pt safety maintained.

## 2016-10-12 NOTE — Progress Notes (Signed)
[facility] MD Progress Note  10/12/2016 1:03 PM Limuel Nieblas  MRN:  [id number]  Subjective: Chandra continues to report significant sense of anxiety. He says he is pretty anxious, worried if his wife & children are okay. At present does not endorse medication side effects.  Denies suicidal ideations.  Objective:  I have met with patient and have discussed case with treatment team . Patient presents partially improved compared to admission - better groomed, more interactive, still vaguely guarded, paranoid, but in general better related . Also, more visible on theunit, going to some groups. He continues to present with an anxious affect and at times appearing confused and bewildered & worried. However, today he is fully alert and attentive, fully oriented x 3,  no evidence of delirium. He denies suicidal ideations, and today identifies his children & wife as protective factor against hurting self. He does ruminate about his brother's death, and states he has a sense of guilt and responsibility about his death.  With his express consent the Md had spoken via phone to his wife, who has been visiting him regularly - she reports he appears to be slowly improving, but notices increased anxiety. Still far from his baseline .She reports he has been calling often, seeking reassurance that she and the children are OK. Mr. Aigner reports that his wife visited him yesterday.   Principal Problem: MDD (major depressive disorder), single episode, severe with psychotic features ([place]) Diagnosis:   Patient Active Problem List   Diagnosis Date Noted  . Generalized anxiety disorder [F41.1] 10/03/2016  . MDD (major depressive disorder), single episode, severe with psychotic features ([place]) [F32.3] 10/03/2016   Total Time spent with patient: 15  minutes   Past Psychiatric History: Please see H&P.   Past Medical History:  Past Medical History:  Diagnosis Date  . Allergy   . Depression    History reviewed. No  pertinent surgical history. Family History:  Family History  Problem Relation Age of Onset  . Mental illness Father   . Heart disease Paternal Grandfather   . Mental illness Brother   . Suicidality Brother    Family Psychiatric  History: Father has depression, his brother shot self and died 2 and 1/2 yrs ago. Social History: Please see H&P.  History  Alcohol Use  . Yes    Comment: 2 drinks/week     History  Drug Use No    Social History   Social History  . Marital status: Married    Spouse name: N/A  . Number of children: N/A  . Years of education: N/A   Social History Main Topics  . Smoking status: Never Smoker  . Smokeless tobacco: Never Used  . Alcohol use Yes     Comment: 2 drinks/week  . Drug use: No  . Sexual activity: Yes   Other Topics Concern  . None   Social History Narrative  . None   Additional Social History:    Pain Medications: none Prescriptions: none Over the Counter: none History of alcohol / drug use?: No history of alcohol / drug abuse  Sleep: improving   Appetite:  Improving   Current Medications: Current Facility-Administered Medications  Medication Dose Route Frequency Provider Last Rate Last Dose  . acetaminophen (TYLENOL) tablet 650 mg  650 mg Oral Q6H PRN Benjamine Mola, FNP      . alum & mag hydroxide-simeth (MAALOX/MYLANTA) 200-200-20 MG/5ML suspension 30 mL  30 mL Oral Q4H PRN Benjamine Mola, FNP      .  ARIPiprazole (ABILIFY) tablet 15 mg  15 mg Oral QHS Jenne Campus, MD   15 mg at 10/11/16 2121  . escitalopram (LEXAPRO) tablet 15 mg  15 mg Oral QHS Jenne Campus, MD   15 mg at 10/11/16 1721  . LORazepam (ATIVAN) tablet 1 mg  1 mg Oral Q6H PRN Jenne Campus, MD   1 mg at 10/12/16 6384   Or  . LORazepam (ATIVAN) injection 1 mg  1 mg Intramuscular Q6H PRN Myer Peer Cobos, MD      . magnesium hydroxide (MILK OF MAGNESIA) suspension 30 mL  30 mL Oral Daily PRN Benjamine Mola, FNP      . traZODone (DESYREL) tablet 100  mg  100 mg Oral QHS PRN Jenne Campus, MD   100 mg at 10/11/16 2121  . ziprasidone (GEODON) capsule 20 mg  20 mg Oral TID PRN Ursula Alert, MD   20 mg at 10/07/16 1054   Or  . ziprasidone (GEODON) injection 10 mg  10 mg Intramuscular TID PRN Ursula Alert, MD       Lab Results:  No results found for this or any previous visit (from the past 48 hour(s)).  Blood Alcohol level:  Lab Results  Component Value Date   ETH <5 53/64/6803   Metabolic Disorder Labs: Lab Results  Component Value Date   HGBA1C 5.0 10/05/2016   MPG 97 10/05/2016   Lab Results  Component Value Date   PROLACTIN 10.8 10/05/2016   Lab Results  Component Value Date   CHOL 190 10/05/2016   TRIG 108 10/05/2016   HDL 59 10/05/2016   CHOLHDL 3.2 10/05/2016   VLDL 22 10/05/2016   LDLCALC 109 (H) 10/05/2016   Physical Findings: AIMS:  , ,  ,  ,    CIWA:    COWS:     Musculoskeletal: Strength & Muscle Tone: within normal limits Gait & Station: normal Patient leans: N/A  Psychiatric Specialty Exam: Physical Exam  Nursing note and vitals reviewed.   Review of Systems  Psychiatric/Behavioral: Positive for depression. The patient is nervous/anxious and has insomnia.   All other systems reviewed and are negative. no chest pain, no shortness of breath at this time  Blood pressure 111/63, pulse 82, temperature 97.9 F (36.6 C), temperature source Oral, resp. rate 16, height '6\' 2"'  (1.88 m), weight 97.5 kg (215 lb).Body mass index is 27.6 kg/m.  General Appearance: improved grooming   Eye Contact:  Good   Speech:  Normal Rate  Volume:  Soft   Mood:  Partially improved although still depressed   Affect: less guarded, less suspicious, remains anxious   Thought Process: presents linear , slowed   Orientation:  Full (Time, Place, and Person)  Thought Content: denies hallucinations,and does not appear internally preoccupied, no clear delusions identified but reports ongoing preoccupations about his  family's well being, but no over paranoid preoccupations   Suicidal Thoughts:  No denies suicidal or self injurious ideations , contracts for safety on the unit   Homicidal Thoughts:  No denies any violent or  homicidal ideations   Memory: recent and remote improved 3/3 immediate recall and 2/3 at 5 minutes   Judgement:  Gradually improving   Insight:  Gradually improving   Psychomotor Activity:  Improving, more visible on unit   Concentration:  Concentration: improving  and Attention Span: improving   Recall:  [employer] of Knowledge:  Fair  Language:  Fair  Akathisia:  No  Handed:  Right  AIMS (if indicated):     Assets:  Desire for Improvement  ADL's:  Fair, but improving   Cognition:  WNL  Sleep:  Number of Hours: 4.5    Assessment - patient is presenting with partial , gradual improvement- presents better related, somewhat more interactive and participative in milieu, denying any SI. Remains anxious and ruminative . Tolerating medications well . Of note, anxiety does not appear to be related to akathisia, as not restlessness or pacing noted and patient able to sit comfortably though session.  Treatment Plan Summary:   MDD (major depressive disorder), single episode, severe with psychotic features ([place])  Daily contact with patient to assess and evaluate symptoms and progress in treatment and Medication management  Reviewed past medical records,treatment plan.   For Depression: Continue Lexapro 15 mg po daily.  For psychosis: Continue Abilify 15  mg po qhs.  For insomnia: Trazodone 100 mg po qhs PRN for insomnia   For anxiety/agitation: Continue Ativan 1 mgr Q 6 hours PRN for severe anxiety  Continue to encourage increased group and milieu participation to work on coping skills and symptom reduction Treatment team working on disposition planning options  Lindell Spar I, NP 10/12/2016, 1:03 PM  Patient ID: Luc Shammas, male   DOB: Jul 26, 1966, 50 y.o.   MRN:  [id number] Agree with NP progress note as above

## 2016-10-12 NOTE — [facility] Group Notes (Signed)
[facility] LCSW Group Therapy  10/12/2016 3:08 PM  Type of Therapy:  Group Therapy  Participation Level:  Active  Participation Quality:  Attentive  Affect:  Flat  Cognitive:  Alert  Insight:  Limited  Engagement in Therapy:  Limited  Modes of Intervention:  Discussion, Education, Socialization and Support  Summary of Progress/Problems: Patients identify obstacles, self-sabotaging and enabling behaviors. Patients explore aspects of self sabotage and enabling and how to limit these self-destructive behaviors in everyday life.   Gissele Narducci L Cristela Stalder MSW, LCSWA  10/12/2016, 3:08 PM   

## 2016-10-12 NOTE — Progress Notes (Signed)
D: Pt denies SI/HI/AVH. Pt is pleasant and cooperative. Pt stated he has a so so day. Pt did not seem as forgetful this evening as he was the past few days. Pt stated he was trying the breathing techniques learned yesterday and that helped him som during the day  A: Pt was offered support and encouragement. Pt was given scheduled medications. Pt was encourage to attend groups. Q 15 minute checks were done for safety.   R:Pt attends groups and interacts well with peers and staff. Pt is taking medication. Pt has no complaints.Pt receptive to treatment and safety maintained on unit.

## 2016-10-12 NOTE — Progress Notes (Signed)
Recreation Therapy Notes  Date: 10/12/16 Time: 1000 Location: 500 Hall Dayroom  Group Topic: Leisure Education  Goal Area(s) Addresses:  Patient will identify positive leisure activities.  Patient will identify one positive benefit of participation in leisure activities.   Behavioral Response: Engaged  Intervention: PublixBall bag, various sports equipment, paper to write on  Activity: Gamin.  LRT put various sports equipment and random objects into a bag.  Each person in the group was to pick something from the bag.  The group would then have 20 minutes to make up a game, give it a name, create the rules and use all the equipment they selected.  At the end, they would then have to teach the other group how to play their game.  Education:  Leisure Education, Building control surveyorDischarge Planning  Education Outcome: Acknowledges education/In group clarification offered/Needs additional education  Clinical Observations/Feedback: Pt was brighter.  Pt and his peer created a game called "Bell Toss" with 2 stress balls, cow bell, maraca, sheet of paper and a stick.  Pt and peer shared equal responsibility in activity.  Pt stated it was a little challenging to create an activity with the materials they had.  Pt expressed using creativity and teamwork from this activity would help him to build better relationships moving forward.   Caroll RancherMarjette Allister Lessley, LRT/CTRS      Caroll RancherLindsay, Fiorella Hanahan A 10/12/2016 11:28 AM

## 2016-10-12 NOTE — Progress Notes (Signed)
Adult Psychoeducational Group Note  Date:  10/12/2016 Time:  8:47 PM  Group Topic/Focus:  Wrap-Up Group:   The focus of this group is to help patients review their daily goal of treatment and discuss progress on daily workbooks.   Participation Level:  Active  Participation Quality:  Appropriate  Affect:  Appropriate  Cognitive:  Appropriate  Insight: Appropriate  Engagement in Group:  Engaged  Modes of Intervention:  Discussion  Additional Comments: The patient expressed that he had a rough day dealing with his anxiety.The patient also said that he rates today a 7. Octavio Mannshigpen, Minor Iden Lee 10/12/2016, 8:47 PM

## 2016-10-12 NOTE — [facility] Suicide Risk Assessment (Signed)
CSW spoke to pt's wife Lars MageDanielle Mittal. She states "last night was the worse I have seen him." She reports he discussed getting paternity tests for everyone in the family due to government conspiracy and wanting to open a homicide case in the death of his brother.   Daisy FloroCandace L Saranya Harlin MSW, LCSWA  10/12/2016 12:31 PM

## 2016-10-13 MED ORDER — ESCITALOPRAM OXALATE 20 MG PO TABS
20.0000 mg | ORAL_TABLET | Freq: Every day | ORAL | Status: DC
  Administered 2016-10-13 – 2016-10-16 (×4): 20 mg via ORAL
  Filled 2016-10-13 (×3): qty 1
  Filled 2016-10-13: qty 3
  Filled 2016-10-13 (×2): qty 1

## 2016-10-13 MED ORDER — ESCITALOPRAM OXALATE 20 MG PO TABS: 20.0000 mg | ORAL_TABLET | Freq: Every day | ORAL | Status: DC

## 2016-10-13 MED ORDER — ARIPIPRAZOLE 10 MG PO TABS
20.0000 mg | ORAL_TABLET | Freq: Every day | ORAL | Status: DC
  Administered 2016-10-13 – 2016-10-15 (×3): 20 mg via ORAL
  Filled 2016-10-13 (×4): qty 2
  Filled 2016-10-13: qty 6

## 2016-10-13 MED ORDER — TRAZODONE HCL 100 MG PO TABS
100.0000 mg | ORAL_TABLET | Freq: Every day | ORAL | Status: DC
  Administered 2016-10-15: 100 mg via ORAL
  Filled 2016-10-13: qty 1
  Filled 2016-10-13: qty 3
  Filled 2016-10-13 (×4): qty 1

## 2016-10-13 MED ORDER — LORAZEPAM 0.5 MG PO TABS
0.5000 mg | ORAL_TABLET | ORAL | Status: DC
  Administered 2016-10-13 – 2016-10-14 (×5): 0.5 mg via ORAL
  Administered 2016-10-15: 09:00:00 via ORAL
  Filled 2016-10-13 (×5): qty 1

## 2016-10-13 NOTE — Progress Notes (Addendum)
Adult Psychoeducational Group Note  Date:  10/13/2016 Time:  9:20 PM  Group Topic/Focus:  Wrap-Up Group:   The focus of this group is to help patients review their daily goal of treatment and discuss progress on daily workbooks.   Participation Level:  Active  Participation Quality:  Appropriate  Affect:  Appropriate  Cognitive:  Alert  Insight: Appropriate  Engagement in Group:  Engaged  Modes of Intervention:  Discussion  Additional Comments:  Patient states, "my day was okay". Patient also states, "I was able to keep high anxiety under control. Patient's goal throughout the day was to be calm and steady. Jerry Thornton L Jerry Thornton 10/13/2016, 9:20 PM

## 2016-10-13 NOTE — Progress Notes (Signed)
Recreation Therapy Notes  Date: 10/13/16 Time: 1000 Location: 500 Hall Dayroom  Group Topic: Self-Esteem  Goal Area(s) Addresses:  Patient will identify positive ways to increase self-esteem. Patient will verbalize benefit of increased self-esteem.  Behavioral Response: Engaged  Intervention: Worksheet with mirror outline, writing utencils  Activity: Ship brokerMirror, Ship brokerMirror.  Patients were given a worksheet with an outline of a mirror on it.  Patients were to write or draw how they see themselves at this moment.  Once patients identified how they viewed themselves, they were to identify any areas where they felt they could use some improvement and what steps they could take to improve in those areas.  Education: Self-Esteem, Building control surveyorDischarge Planning.   Education Outcome: Acknowledges education/In group clarification offered/Needs additional education  Clinical Observations/Feedback: Pt completed the activity but left before processing and did not return.   Caroll RancherMarjette Geraldean Walen, LRT/CTRS        Caroll RancherLindsay, Lailani Tool A 10/13/2016 12:06 PM

## 2016-10-13 NOTE — Progress Notes (Signed)
[facility]  10/13/2016 1:32 PM Jerry Thornton  MRN:  [id number]  Subjective: Jerry Thornton states " I am still anxious , worried about the safety of my family , I still question the death of my brother , I am suspicious about what may have happened . I still feel paranoid often.'   Objective: Patient seen and chart reviewed.Discussed patient with treatment team.  Patient today seen as anxious , depressed and paranoid. Per RN - pt often noted as guarded . Pt also paranoid about his medications and the water give along with his medications. Pt when asked about this states that he feels people are trying to kill him. Pt encouraged to attend groups - discussed increasing his medication dose.   Principal Problem: MDD (major depressive disorder), single episode, severe with psychotic features ([facility]) Diagnosis:   Patient Active Problem List   Diagnosis Date Noted  . Generalized anxiety disorder [F41.1] 10/03/2016  . MDD (major depressive disorder), single episode, severe with psychotic features ([facility]) [F32.3] 10/03/2016   Total Time spent with patient: 25  minutes   Past Psychiatric History: Please see H&P.   Past Medical History:  Past Medical History:  Diagnosis Date  . Allergy   . Depression    History reviewed. No pertinent surgical history. Family History:  Family History  Problem Relation Age of Onset  . Mental illness Father   . Heart disease Paternal Grandfather   . Mental illness Brother   . Suicidality Brother    Family Psychiatric  History: Father has depression, his brother shot self and died 2 and 1/2 yrs ago.   Social History: Please see H&P.  History  Alcohol Use  . Yes    Comment: 2 drinks/week     History  Drug Use No    Social History   Social History  . Marital status: Married    Spouse name: N/A  . Number of children: N/A  . Years of education: N/A   Social History Main Topics  . Smoking status: Never Smoker  . Smokeless tobacco: Never  Used  . Alcohol use Yes     Comment: 2 drinks/week  . Drug use: No  . Sexual activity: Yes   Other Topics Concern  . None   Social History Narrative  . None   Additional Social History:    Pain Medications: none Prescriptions: none Over the Counter: none History of alcohol / drug use?: No history of alcohol / drug abuse  Sleep: fair  Appetite:  fair   Current Medications: Current Facility-Administered Medications  Medication Dose Route Frequency Provider Last Rate Last Dose  . acetaminophen (TYLENOL) tablet 650 mg  650 mg Oral Q6H PRN Beau FannyJohn C Withrow, FNP      . alum & mag hydroxide-simeth (MAALOX/MYLANTA) 200-200-20 MG/5ML suspension 30 mL  30 mL Oral Q4H PRN Beau FannyJohn C Withrow, FNP      . ARIPiprazole (ABILIFY) tablet 20 mg  20 mg Oral QHS Neysha Criado, MD      . escitalopram (LEXAPRO) tablet 20 mg  20 mg Oral Daily Nolin Grell, MD   20 mg at 10/13/16 1200  . LORazepam (ATIVAN) tablet 1 mg  1 mg Oral Q6H PRN Craige CottaFernando A Cobos, MD   1 mg at 10/13/16 0747   Or  . LORazepam (ATIVAN) injection 1 mg  1 mg Intramuscular Q6H PRN Rockey SituFernando A Cobos, MD      . LORazepam (ATIVAN) tablet 0.5 mg  0.5 mg Oral [id number] Jomarie LongsSaramma Tymara Saur, MD      .  magnesium hydroxide (MILK OF MAGNESIA) suspension 30 mL  30 mL Oral Daily PRN Beau FannyJohn C Withrow, FNP      . traZODone (DESYREL) tablet 100 mg  100 mg Oral QHS PRN Craige CottaFernando A Cobos, MD   100 mg at 10/12/16 2208  . ziprasidone (GEODON) capsule 20 mg  20 mg Oral TID PRN Jomarie LongsSaramma Brently Voorhis, MD   20 mg at 10/13/16 [id number]   Or  . ziprasidone (GEODON) injection 10 mg  10 mg Intramuscular TID PRN Jomarie LongsSaramma Jamarrion Budai, MD       Lab Results:  No results found for this or any previous visit (from the past 48 hour(s)).  Blood Alcohol level:  Lab Results  Component Value Date   ETH <5 10/02/2016   Metabolic Disorder Labs: Lab Results  Component Value Date   HGBA1C 5.0 10/05/2016   MPG 97 10/05/2016   Lab Results  Component Value Date   PROLACTIN 10.8 10/05/2016    Lab Results  Component Value Date   CHOL 190 10/05/2016   TRIG 108 10/05/2016   HDL 59 10/05/2016   CHOLHDL 3.2 10/05/2016   VLDL 22 10/05/2016   LDLCALC 109 (H) 10/05/2016   Physical Findings: AIMS:  , ,  ,  ,    CIWA:    COWS:     Musculoskeletal: Strength & Muscle Tone: within normal limits Gait & Station: normal Patient leans: N/A  Psychiatric Specialty Exam: Physical Exam  Nursing note and vitals reviewed.   Review of Systems  Psychiatric/Behavioral: Positive for depression. The patient is nervous/anxious.   All other systems reviewed and are negative.   Blood pressure 112/71, pulse 79, temperature 97.5 F (36.4 C), temperature source Oral, resp. rate 20, height 6\' 2"  (1.88 m), weight 97.5 kg (215 lb).Body mass index is 27.6 kg/m.  General Appearance: fair  Eye Contact: fair  Speech:  Normal Rate  Volume: normal  Mood:anxious , depressed  Affect: congruent  Thought Process: linear  Orientation:  Full (Time, Place, and Person)  Thought Content: paranoid , rumination, delusions  Suicidal Thoughts:  No but is paranoid and hence a danger to self or others  Homicidal Thoughts:  No   Memory: recent - fair, remote - fair, immediate - fair  Judgement: fair  Insight: improving  Psychomotor Activity:  normal  Concentration:  Concentration: Fair and Attention Span: Fair  Recall:  [employer]  Fund of Knowledge:  Fair  Language:  Fair  Akathisia:  No  Handed:  Right  AIMS (if indicated):     Assets:  Desire for Improvement  ADL's: fair  Cognition:  WNL  Sleep:  Number of Hours: 5.75    Assessment - Patient continues to be delusional, anxious , depressed , paranoid - continue to readjust medications and observe on the unit.   Treatment Plan Summary:   MDD (major depressive disorder), single episode, severe with psychotic features ([facility]) unstable - improving   Will continue today 10/13/16  plan as below except where it is noted.  Daily contact with patient to  assess and evaluate symptoms and progress in treatment and Medication management  Reviewed past medical records,treatment plan.   For Depression: Will increase Lexapro to 20 mg po daily.  For psychosis: Will increase Abilify to 20  mg po qhs.  For insomnia: Trazodone 100 mg po qhs PRN for insomnia   For anxiety sx: Start Ativan 0.5 mg po tid .Could taper down as he improves.  For anxiety/agitation: Continue Ativan 1 mgr po Q 6 hours  PRN for severe anxiety  Continue to encourage increased group and milieu participation to work on coping skills and symptom reduction  Treatment team working on disposition planning options  CSW will continue working on disposition.  Kayvon Mo, MD 10/13/2016, 1:32 PM  Patient ID: Neel Buffone, male   DOB: 1966-08-15, 50 y.o.   MRN: [id number]

## 2016-10-13 NOTE — [facility] Group Notes (Signed)
[facility] LCSW Group Therapy  10/13/2016 4:33 PM  Type of Therapy:  Group Therapy  Participation Level: Minimal   Participation Quality: Inattentive   Affect:  Flat  Cognitive:  Alert  Insight:  Limited  Engagement in Therapy:  Limited  Modes of Intervention:  Discussion, Education, Socialization and Support  Summary of Progress/Problems: Balance in life: Patients will discuss the concept of balance and how it looks and feels to be unbalanced. Pt will identify areas in their life that is unbalanced and ways to become more balanced.    Zaelynn Fuchs L Stryder Poitra MSW, LCSWA  10/13/2016, 4:33 PM  

## 2016-10-13 NOTE — Tx Team (Signed)
Interdisciplinary Treatment and Diagnostic Plan Update  10/13/2016 Time of Session: 10:33 AM  Jerry Thornton MRN: [id number]  Principal Diagnosis: MDD (major depressive disorder), single episode, severe with psychotic features ([place])  Secondary Diagnoses: Principal Problem:   MDD (major depressive disorder), single episode, severe with psychotic features ([place])   Current Medications:  Current Facility-Administered Medications  Medication Dose Route Frequency Provider Last Rate Last Dose  . acetaminophen (TYLENOL) tablet 650 mg  650 mg Oral Q6H PRN Benjamine Mola, FNP      . alum & mag hydroxide-simeth (MAALOX/MYLANTA) 200-200-20 MG/5ML suspension 30 mL  30 mL Oral Q4H PRN Benjamine Mola, FNP      . ARIPiprazole (ABILIFY) tablet 15 mg  15 mg Oral QHS Jenne Campus, MD   15 mg at 10/12/16 2129  . escitalopram (LEXAPRO) tablet 15 mg  15 mg Oral QHS Jenne Campus, MD   15 mg at 10/12/16 1826  . LORazepam (ATIVAN) tablet 1 mg  1 mg Oral Q6H PRN Jenne Campus, MD   1 mg at 10/13/16 0747   Or  . LORazepam (ATIVAN) injection 1 mg  1 mg Intramuscular Q6H PRN Myer Peer Cobos, MD      . magnesium hydroxide (MILK OF MAGNESIA) suspension 30 mL  30 mL Oral Daily PRN Benjamine Mola, FNP      . traZODone (DESYREL) tablet 100 mg  100 mg Oral QHS PRN Jenne Campus, MD   100 mg at 10/12/16 2208  . ziprasidone (GEODON) capsule 20 mg  20 mg Oral TID PRN Ursula Alert, MD   20 mg at 10/13/16 6294   Or  . ziprasidone (GEODON) injection 10 mg  10 mg Intramuscular TID PRN Ursula Alert, MD        PTA Medications: Prescriptions Prior to Admission  Medication Sig Dispense Refill Last Dose  . ibuprofen (ADVIL,MOTRIN) 200 MG tablet Take 200 mg by mouth every 6 (six) hours as needed. Reported on 04/26/2016   Past Week at Unknown time  . Multiple Vitamins-Minerals (MULTIVITAMIN WITH MINERALS) tablet Take 1 tablet by mouth daily. Reported on 04/26/2016   Past Week at Unknown time  . Omega-3 Fatty Acids  (FISH OIL PO) Take 1 capsule by mouth daily. Reported on 04/26/2016   Past Week at Unknown time    Treatment Modalities: Medication Management, Group therapy, Case management,  1 to 1 session with clinician, Psychoeducation, Recreational therapy.   Physician Treatment Plan for Primary Diagnosis: MDD (major depressive disorder), single episode, severe with psychotic features ([place]) Long Term Goal(s): Improvement in symptoms so as ready for discharge  Short Term Goals: Ability to identify changes in lifestyle to reduce recurrence of condition will improve Compliance with prescribed medications will improve  Medication Management: Evaluate patient's response, side effects, and tolerance of medication regimen.  Therapeutic Interventions: 1 to 1 sessions, Unit Group sessions and Medication administration.  Evaluation of Outcomes: Progressing  Physician Treatment Plan for Secondary Diagnosis: Principal Problem:   MDD (major depressive disorder), single episode, severe with psychotic features ([place])   Long Term Goal(s): Improvement in symptoms so as ready for discharge  Short Term Goals:Ability to disclose and discuss suicidal ideas Ability to identify and develop effective coping behaviors will improve   Medication Management: Evaluate patient's response, side effects, and tolerance of medication regimen.  Therapeutic Interventions: 1 to 1 sessions, Unit Group sessions and Medication administration.  Evaluation of Outcomes: Progressing   RN Treatment Plan for Primary Diagnosis: MDD (major depressive disorder),  single episode, severe with psychotic features ([place]) Long Term Goal(s): Knowledge of disease and therapeutic regimen to maintain health will improve  Short Term Goals: Ability to verbalize feelings will improve and Ability to identify and develop effective coping behaviors will improve  Medication Management: RN will administer medications as ordered by provider, will assess and  evaluate patient's response and provide education to patient for prescribed medication. RN will report any adverse and/or side effects to prescribing provider.  Therapeutic Interventions: 1 on 1 counseling sessions, Psychoeducation, Medication administration, Evaluate responses to treatment, Monitor vital signs and CBGs as ordered, Perform/monitor CIWA, COWS, AIMS and Fall Risk screenings as ordered, Perform wound care treatments as ordered.  Evaluation of Outcomes: Progressing   Recreational Therapy Treatment Plan for Primary Diagnosis: MDD (major depressive disorder), single episode, severe with psychotic features ([place]) Long Term Goal(s): LTG- Patient will participate in recreation therapy tx in at least 2 group sessions without prompting from LRT.  Short Term Goals: Without prompting or encouragement patient will spontaneously contribute to discussions during at least 2 recreation therapy group sessions by conclusion of recreation therapy tx.  Treatment Modalities: Group and Pet Therapy  Therapeutic Interventions: Psychoeducation  Evaluation of Outcomes: Progressing   LCSW Treatment Plan for Primary Diagnosis: MDD (major depressive disorder), single episode, severe with psychotic features ([place]) Long Term Goal(s): Safe transition to appropriate next level of care at discharge, Engage patient in therapeutic group addressing interpersonal concerns.  Short Term Goals: Engage patient in aftercare planning with referrals and resources  Therapeutic Interventions: Assess for all discharge needs, 1 to 1 time with Social worker, Explore available resources and support systems, Assess for adequacy in community support network, Educate family and significant other(s) on suicide prevention, Complete Psychosocial Assessment, Interpersonal group therapy.  Evaluation of Outcomes: Met  Return home, follow up IOP   Progress in Treatment: Attending groups: Yes Participating in groups: Yes Taking  medication as prescribed: Yes Toleration medication: Yes, no side effects reported at this time Family/Significant other contact made: Yes Patient understands diagnosis: No  Limited insight Discussing patient identified problems/goals with staff: Yes Medical problems stabilized or resolved: Yes Denies suicidal/homicidal ideation: Yes Issues/concerns per patient self-inventory: None Other: N/A  New problem(s) identified: None identified at this time.   New Short Term/Long Term Goal(s): None identified at this time.   Discharge Plan or Barriers: Pt plans to return home and follow up with outpatient.    Reason for Continuation of Hospitalization: Depression Hallucinations Medication stabilization Paranoia    Estimated Length of Stay: 3-5 days  Attendees: Patient: 10/13/2016  10:33 AM  Physician: Neita Garnet, MD 10/13/2016  10:33 AM  Nursing: Hoy Register, RN 10/13/2016  10:33 AM  RN Care Manager: Lars Pinks, RN 10/13/2016  10:33 AM  Social Worker: [place], [place] 10/13/2016  10:33 AM  Recreational Therapist: Janaysha Depaulo  10/13/2016  10:33 AM  Other: Norberto Sorenson 10/13/2016  10:33 AM  Other:  10/13/2016  10:33 AM    Scribe for Treatment Team:  Wray Kearns, LCSWA

## 2016-10-13 NOTE — Progress Notes (Signed)
Pt spent the day slowly pacing the unit walking to his room, the day room, the halls and back. He lays down for small intervals of maybe 15 minutes but mostly walks slowly and watches Tv. He does not engage with staff or peers unless spoken to. He is very polite with no traces of aggression. After receiving a PRN Ativan this am, pt voiced no more complaints of agitation or anxiety for the rest of the shift. When asked how his day is going or any questions, pt appears perplexed and seems to have to think about what you're asking prior to answering. Endorses no paranoid thoughts. Denied SI/HI/AVH.

## 2016-10-13 NOTE — Progress Notes (Signed)
D: Pt denies SI/HI/AVH. Pt is pleasant and cooperative. Pt stated he was feeling a little better today compared to the previous 2 days.   A: Pt was offered support and encouragement. Pt was encourage to attend groups. Q 15 minute checks were done for safety.   R: Pt is taking medication. Pt has no complaints.Pt receptive to treatment and safety maintained on unit.

## 2016-10-13 NOTE — Progress Notes (Signed)
D: Pt alert and ambulatory. Presents in an anxious apprehensive worried mood. Requested a PRN for anxiety and agitation. Rates his depression a 5/10 and his anxiety a 10/10. Denies SI/HI/AVH/Pain.  A: Given PRN Ativan 1 mg po at 0747. Encouraged pt to utilize coping skills with regards to anxiety and depression. Safety checks maintained.   R: Pt having fairly good results from PRN. Voiced no additional concerns.

## 2016-10-13 NOTE — Plan of Care (Signed)
Problem: Coping: Goal: Ability to cope will improve Outcome: Progressing Pt stated he was feeling better today compared to the last 2 days.

## 2016-10-14 NOTE — Progress Notes (Signed)
Pt attended Music Therapy this evening.  

## 2016-10-14 NOTE — [facility] Group Notes (Signed)
[facility] LCSW Group Therapy  10/14/2016 4:10 PM  Type of Therapy:  Group Therapy  Participation Level:  Did Not Attend  Modes of Intervention:  Discussion, Education, Socialization and Support  Summary of Progress/Problems: Feelings around Relapse. Group members discussed the meaning of relapse and shared personal stories of relapse, how it affected them and others, and how they perceived themselves during this time. Group members were encouraged to identify triggers, warning signs and coping skills used when facing the possibility of relapse. Social supports were discussed and explored in detail.  Dutch Ing L Ia Leeb MSW, LCSWA  10/14/2016, 4:10 PM   

## 2016-10-14 NOTE — Plan of Care (Signed)
Problem: Safety: Goal: Periods of time without injury will increase Outcome: Progressing Pt. remains a low fall risk, denies SI/HI/AVH at this time, Q 15 checks in place for safety.    

## 2016-10-14 NOTE — Progress Notes (Signed)
[facility]  10/14/2016 4:20 PM Harriette Oharalexander Cirrincione  MRN:  [id number]  Subjective: [name] states " I am still anxious . I spoke to my wife and they are safe , that helps."   Objective: Patient seen and chart reviewed.Discussed patient with treatment team.  Patient today seen as anxious. Pt continues to ruminate about his several stressors . Pt advised to make use of coping skills - work on deep breathing , relaxation techniques. Pt tolerating medications well. Pt per RN - appears less paranoid than previous days. Continue treatment.   Principal Problem: MDD (major depressive disorder), single episode, severe with psychotic features ([facility]) Diagnosis:   Patient Active Problem List   Diagnosis Date Noted  . Generalized anxiety disorder [F41.1] 10/03/2016  . MDD (major depressive disorder), single episode, severe with psychotic features ([facility]) [F32.3] 10/03/2016   Total Time spent with patient: 15  minutes   Past Psychiatric History: Please see H&P.   Past Medical History:  Past Medical History:  Diagnosis Date  . Allergy   . Depression    History reviewed. No pertinent surgical history. Family History:  Family History  Problem Relation Age of Onset  . Mental illness Father   . Heart disease Paternal Grandfather   . Mental illness Brother   . Suicidality Brother    Family Psychiatric  History: Father has depression, his brother shot self and died 2 and 1/2 yrs ago.   Social History: Please see H&P.  History  Alcohol Use  . Yes    Comment: 2 drinks/week     History  Drug Use No    Social History   Social History  . Marital status: Married    Spouse name: N/A  . Number of children: N/A  . Years of education: N/A   Social History Main Topics  . Smoking status: Never Smoker  . Smokeless tobacco: Never Used  . Alcohol use Yes     Comment: 2 drinks/week  . Drug use: No  . Sexual activity: Yes   Other Topics Concern  . None   Social History  Narrative  . None   Additional Social History:    Pain Medications: none Prescriptions: none Over the Counter: none History of alcohol / drug use?: No history of alcohol / drug abuse  Sleep: fair  Appetite:  fair   Current Medications: Current Facility-Administered Medications  Medication Dose Route Frequency Provider Last Rate Last Dose  . acetaminophen (TYLENOL) tablet 650 mg  650 mg Oral Q6H PRN Beau FannyJohn C Withrow, FNP      . alum & mag hydroxide-simeth (MAALOX/MYLANTA) 200-200-20 MG/5ML suspension 30 mL  30 mL Oral Q4H PRN Beau FannyJohn C Withrow, FNP      . ARIPiprazole (ABILIFY) tablet 20 mg  20 mg Oral QHS Jomarie LongsSaramma Erich Kochan, MD   20 mg at 10/13/16 2322  . escitalopram (LEXAPRO) tablet 20 mg  20 mg Oral Daily Jomarie LongsSaramma Allina Riches, MD   20 mg at 10/14/16 0817  . LORazepam (ATIVAN) tablet 1 mg  1 mg Oral Q6H PRN Craige CottaFernando A Cobos, MD   1 mg at 10/13/16 0747   Or  . LORazepam (ATIVAN) injection 1 mg  1 mg Intramuscular Q6H PRN Rockey SituFernando A Cobos, MD      . LORazepam (ATIVAN) tablet 0.5 mg  0.5 mg Oral [id number] Jomarie LongsSaramma Kirstie Larsen, MD   0.5 mg at 10/14/16 0817  . magnesium hydroxide (MILK OF MAGNESIA) suspension 30 mL  30 mL Oral Daily PRN Beau FannyJohn C Withrow, FNP      .  traZODone (DESYREL) tablet 100 mg  100 mg Oral QHS Darene Nappi, MD      . ziprasidone (GEODON) capsule 20 mg  20 mg Oral TID PRN Jomarie Longs, MD   20 mg at 10/13/16 8119   Or  . ziprasidone (GEODON) injection 10 mg  10 mg Intramuscular TID PRN Jomarie Longs, MD       Lab Results:  No results found for this or any previous visit (from the past 48 hour(s)).  Blood Alcohol level:  Lab Results  Component Value Date   ETH <5 10/02/2016   Metabolic Disorder Labs: Lab Results  Component Value Date   HGBA1C 5.0 10/05/2016   MPG 97 10/05/2016   Lab Results  Component Value Date   PROLACTIN 10.8 10/05/2016   Lab Results  Component Value Date   CHOL 190 10/05/2016   TRIG 108 10/05/2016   HDL 59 10/05/2016   CHOLHDL 3.2 10/05/2016    VLDL 22 10/05/2016   LDLCALC 109 (H) 10/05/2016   Physical Findings: AIMS:  , ,  ,  ,    CIWA:    COWS:     Musculoskeletal: Strength & Muscle Tone: within normal limits Gait & Station: normal Patient leans: N/A  Psychiatric Specialty Exam: Physical Exam  Nursing note and vitals reviewed.   Review of Systems  Psychiatric/Behavioral: Positive for depression. The patient is nervous/anxious.   All other systems reviewed and are negative.   Blood pressure 112/71, pulse 79, temperature 97.5 F (36.4 C), temperature source Oral, resp. rate 20, height 6\' 2"  (1.88 m), weight 97.5 kg (215 lb).Body mass index is 27.6 kg/m.  General Appearance: fair  Eye Contact: fair  Speech:  Normal Rate  Volume: soft  Mood:depressed, anxious  Affect: congruent  Thought Process: linear  Orientation:  Full (Time, Place, and Person)  Thought Content: paranoid, delusional, rumination  Suicidal Thoughts:  No but is paranoid and hence a danger to self or others  Homicidal Thoughts:  No   Memory: recent - fair, remote - fair, immediate - fair  Judgement: fair  Insight: fair  Psychomotor Activity:  normal  Concentration:  Concentration: Fair and Attention Span: Fair  Recall:  [employer] of Knowledge:  Fair  Language:  Fair  Akathisia:  No  Handed:  Right  AIMS (if indicated):     Assets:  Desire for Improvement  ADL's: fair  Cognition:  WNL  Sleep:  Number of Hours: 4.75    Assessment - Patient continues to ruminate and is paranoid and anxious - although making progress.   Treatment Plan Summary:   MDD (major depressive disorder), single episode, severe with psychotic features ([facility]) unstable - improving   Will continue today 10/14/16  plan as below except where it is noted.  Daily contact with patient to assess and evaluate symptoms and progress in treatment and Medication management  Reviewed past medical records,treatment plan.   For Depression: Will continue Lexapro  20 mg po  daily.  For psychosis: Will continue  Abilify 20  mg po qhs.  For insomnia: Trazodone 100 mg po qhs PRN for insomnia   For anxiety sx: Start Ativan 0.5 mg po tid .Could taper down as he improves.  For anxiety/agitation: Continue Ativan 1 mgr po Q 6 hours PRN for severe anxiety  Continue to encourage increased group and milieu participation to work on coping skills and symptom reduction  Treatment team working on disposition planning options  CSW will continue working on disposition.  Mayeli Bornhorst,  MD 10/14/2016, 4:20 PM  Patient ID: Harriette Oharalexander Barile, male   DOB: [birth year], 50 y.o.   MRN: [id number]

## 2016-10-14 NOTE — Progress Notes (Signed)
Recreation Therapy Notes  Date: 10/14/16 Time: 1000 Location: 500 Hall Dayroom  Group Topic: Communication  Goal Area(s) Addresses:  Patient will effectively communicate with peers in group.  Patient will verbalize benefit of healthy communication. Patient will verbalize positive effect of healthy communication on post d/c goals.  Patient will identify communication techniques that made activity effective for group.   Behavioral Response: Engaged  Intervention: Nurse, adultGiant beach ball, chairs  Activity: Keep It ContractorGoing Volleyball.  Patients were arranged in a circle.  Patients were to hit a giant beach ball to each like you would in volleyball.  Patients were to hit the ball as many times as they could without it rolling to a stop on the floor.  The ball could bounce off of the floor but not stop.  The group was trying to beat the record set by a previous group of 650.  Patients were to use teamwork, communication and strategies to complete the activity.  Education: Communication, Discharge Planning  Education Outcome: Acknowledges understanding/In group clarification offered/Needs additional education.   Clinical Observations/Feedback:  Pt was brighter and active during group.  Pt wasn't as social with peers but would smile during group.  Pt didn't offer anything during processing.    Caroll RancherMarjette Zniyah Midkiff, LRT/CTRS         Caroll RancherLindsay, Eli Adami A 10/14/2016 12:05 PM

## 2016-10-14 NOTE — Progress Notes (Signed)
  DATA ACTION RESPONSE  Objective- Pt. is up and visible in the milieu, sitting in the dayroom. Pt. attended karaoke this evening with encouragement from Clinical research associatewriter. Pt. presents with an anxious/preoccupied/worried affect and mood. Pt. appears to be cautious/guarded with interaction. Subjective- Denies having any SI/HI/AVH/Pain at this time. Pt. states " I want to talk to the Doctor in the morning regarding the voluntary form I signed today". Pt. continues to be cooperative and remain pleasant on the unit.  1:1 interaction in private to establish rapport. Encouragement, education, & support given from staff. Meds. ordered and administered. Pt. refused Trazodone this evening.   Safety maintained with Q 15 checks. Continues to follow treatment plan and will monitor closely. No additonal questions/concerns noted.

## 2016-10-14 NOTE — Progress Notes (Signed)
D: Pt observed pacing in hall at intervals during shift. Presents with flat affect and anxious mood. Pt does appears less disorganized and tangential during conversations. A: Emotional support and availability provided to pt. Medications given as prescribed. Q 15 minutes safety checks maintained without incident. R: Pt attended AM group but not noon group, "that does not resonate with me". Denies adverse medication reactions. Cooperative with care and unit routines. Pt remains safe on and off unit.Tolerates all PO intake well. Pt denies concerns at this time. POC effective for mood stability and safety.

## 2016-10-15 MED ORDER — LORAZEPAM 0.5 MG PO TABS
0.5000 mg | ORAL_TABLET | ORAL | Status: AC
  Administered 2016-10-15 (×2): 0.5 mg via ORAL
  Filled 2016-10-15 (×2): qty 1

## 2016-10-15 MED ORDER — LORAZEPAM 0.5 MG PO TABS: 0.5000 mg | ORAL_TABLET | Freq: Two times a day (BID) | ORAL | Status: DC

## 2016-10-15 MED ORDER — LORAZEPAM 0.5 MG PO TABS
0.5000 mg | ORAL_TABLET | ORAL | Status: DC
  Administered 2016-10-16: 0.5 mg via ORAL
  Filled 2016-10-15: qty 1

## 2016-10-15 NOTE — [facility] Group Notes (Signed)
[facility] LCSW Group Therapy  10/15/2016 1:49 PM  Type of Therapy:  Group Therapy  Participation Level:  Active   Participation Quality:  Attentive  Affect:  Flat  Cognitive:  Alert   Insight:  Improving   Engagement in Therapy:  Improving   Modes of Intervention:  Activity, Discussion, Education, Socialization and Support  Summary of Progress/Problems: Chaplain facilitated group. Patients discussed care and what care means to them. Pt attended group and stayed the entire time. He states people represents care to him.    Sempra EnergyCandace L Thelton Graca MSW, LCSWA  10/15/2016, 1:49 PM

## 2016-10-15 NOTE — Progress Notes (Signed)
Data. Patient denies SI/HI/AVH. He is able to verbally contract for safety on the unit and to come to staff before acting on any self harm thoughts/feelings. Patient isolating to his room for most of shift. Does go to meals and does initiate interactions with requests. Nurse spent time going over the days packet on relapse prevention, with patient. During this time it was noted that patient is thought blocking. He stops mid sentence, scrunches up his face, thinking about a word and then it appears that it is very difficult for him to continue on in the sentence. He than forces the word he is looking for, out. Speech is slow and deliberate. He appeared to get confused frequently during the conversation. On his self assessment he reports 3/10 for anxiety and hopelessness and 5/10 for anxiety. His goal for today is: "Stay focused".  Action. Emotional support and encouragement offered. Education provided on medication, indications and side effect. Q 15 minute checks done for safety. Response. Safety on the unit maintained through 15 minute checks.  Medications taken as prescribed. Attended groups. Remained calm and appropriate through out shift.

## 2016-10-15 NOTE — Progress Notes (Signed)
Recreation Therapy Notes  Date: 10/15/16 Time: 1000 Location: 500 Hall Dayroom  Group Topic: Coping Skills  Goal Area(s) Addresses:  Patient will be able to identify positive coping skills. Patient will be able to identify benefits of coping skills. Patient will be able to identify benefits of using coping skills post d/c.  Behavioral Response: Observed  Intervention: Can with various coping skills in it, dry erase maker, dry erase board  Activity: Coping Skills Pictionary.  LRT had a can with various types of coping skills.  A patient would come to the board, pick a slip of paper from the can and draw whatever is on the paper on the board.  The remaining patients would attempt to guess what the patient is drawing.  The person that guesses correctly would go next.  Education: PharmacologistCoping Skills, Building control surveyorDischarge Planning.   Education Outcome: Acknowledges understanding/In group clarification offered/Needs additional education.   Clinical Observations/Feedback: Pt mostly sat quietly and watched.  Pt guessed one picture right and took one turn.  Pt was flat and seemed depressed.  Pt wasn't social or very interactive.   Caroll RancherMarjette Kyndall Amero, LRT/CTRS         Caroll RancherLindsay, Jenina Moening A 10/15/2016 12:50 PM

## 2016-10-15 NOTE — Progress Notes (Signed)
Adult Psychoeducational Group Note  Date:  10/15/2016 Time:  8:29 PM  Group Topic/Focus:  Wrap-Up Group:   The focus of this group is to help patients review their daily goal of treatment and discuss progress on daily workbooks.   Participation Level:  Active  Participation Quality:  Appropriate  Affect:  Appropriate  Cognitive:  Appropriate  Insight: Appropriate  Engagement in Group:  Engaged  Modes of Intervention:  Discussion  Additional Comments: The patient expressed that he attended groups.The patient is compliant by attending groups. Octavio Mannshigpen, Vonette Grosso Lee 10/15/2016, 8:29 PM

## 2016-10-15 NOTE — Tx Team (Signed)
Interdisciplinary Treatment and Diagnostic Plan Update  10/15/2016 Time of Session: 1:24 PM  Jerry Thornton MRN: [id number]  Principal Diagnosis: MDD (major depressive disorder), single episode, severe with psychotic features ([place])  Secondary Diagnoses: Principal Problem:   MDD (major depressive disorder), single episode, severe with psychotic features ([place])   Current Medications:  Current Facility-Administered Medications  Medication Dose Route Frequency Provider Last Rate Last Dose  . acetaminophen (TYLENOL) tablet 650 mg  650 mg Oral Q6H PRN Benjamine Mola, FNP      . alum & mag hydroxide-simeth (MAALOX/MYLANTA) 200-200-20 MG/5ML suspension 30 mL  30 mL Oral Q4H PRN Benjamine Mola, FNP      . ARIPiprazole (ABILIFY) tablet 20 mg  20 mg Oral QHS Ursula Alert, MD   20 mg at 10/14/16 2211  . escitalopram (LEXAPRO) tablet 20 mg  20 mg Oral Daily Ursula Alert, MD   20 mg at 10/15/16 0831  . LORazepam (ATIVAN) tablet 1 mg  1 mg Oral Q6H PRN Jenne Campus, MD   1 mg at 10/13/16 0747   Or  . LORazepam (ATIVAN) injection 1 mg  1 mg Intramuscular Q6H PRN Myer Peer Cobos, MD      . LORazepam (ATIVAN) tablet 0.5 mg  0.5 mg Oral BH-q8a5phs Saramma Eappen, MD      . Derrill Memo ON 10/16/2016] LORazepam (ATIVAN) tablet 0.5 mg  0.5 mg Oral BH-qamhs Saramma Eappen, MD      . magnesium hydroxide (MILK OF MAGNESIA) suspension 30 mL  30 mL Oral Daily PRN Benjamine Mola, FNP      . traZODone (DESYREL) tablet 100 mg  100 mg Oral QHS Ursula Alert, MD   Stopped at 10/14/16 2212  . ziprasidone (GEODON) capsule 20 mg  20 mg Oral TID PRN Ursula Alert, MD   20 mg at 10/13/16 7711   Or  . ziprasidone (GEODON) injection 10 mg  10 mg Intramuscular TID PRN Ursula Alert, MD        PTA Medications: Prescriptions Prior to Admission  Medication Sig Dispense Refill Last Dose  . ibuprofen (ADVIL,MOTRIN) 200 MG tablet Take 200 mg by mouth every 6 (six) hours as needed. Reported on 04/26/2016   Past Week at  Unknown time  . Multiple Vitamins-Minerals (MULTIVITAMIN WITH MINERALS) tablet Take 1 tablet by mouth daily. Reported on 04/26/2016   Past Week at Unknown time  . Omega-3 Fatty Acids (FISH OIL PO) Take 1 capsule by mouth daily. Reported on 04/26/2016   Past Week at Unknown time    Treatment Modalities: Medication Management, Group therapy, Case management,  1 to 1 session with clinician, Psychoeducation, Recreational therapy.   Physician Treatment Plan for Primary Diagnosis: MDD (major depressive disorder), single episode, severe with psychotic features ([place]) Long Term Goal(s): Improvement in symptoms so as ready for discharge  Short Term Goals: Ability to identify changes in lifestyle to reduce recurrence of condition will improve Compliance with prescribed medications will improve  Medication Management: Evaluate patient's response, side effects, and tolerance of medication regimen.  Therapeutic Interventions: 1 to 1 sessions, Unit Group sessions and Medication administration.  Evaluation of Outcomes: Progressing  Physician Treatment Plan for Secondary Diagnosis: Principal Problem:   MDD (major depressive disorder), single episode, severe with psychotic features ([place])   Long Term Goal(s): Improvement in symptoms so as ready for discharge  Short Term Goals:Ability to disclose and discuss suicidal ideas Ability to identify and develop effective coping behaviors will improve   Medication Management: Evaluate patient's response, side  effects, and tolerance of medication regimen.  Therapeutic Interventions: 1 to 1 sessions, Unit Group sessions and Medication administration.  Evaluation of Outcomes: Progressing   RN Treatment Plan for Primary Diagnosis: MDD (major depressive disorder), single episode, severe with psychotic features ([place]) Long Term Goal(s): Knowledge of disease and therapeutic regimen to maintain health will improve  Short Term Goals: Ability to verbalize feelings  will improve and Ability to identify and develop effective coping behaviors will improve  Medication Management: RN will administer medications as ordered by provider, will assess and evaluate patient's response and provide education to patient for prescribed medication. RN will report any adverse and/or side effects to prescribing provider.  Therapeutic Interventions: 1 on 1 counseling sessions, Psychoeducation, Medication administration, Evaluate responses to treatment, Monitor vital signs and CBGs as ordered, Perform/monitor CIWA, COWS, AIMS and Fall Risk screenings as ordered, Perform wound care treatments as ordered.  Evaluation of Outcomes: Progressing   Recreational Therapy Treatment Plan for Primary Diagnosis: MDD (major depressive disorder), single episode, severe with psychotic features ([place]) Long Term Goal(s): LTG- Patient will participate in recreation therapy tx in at least 2 group sessions without prompting from LRT.  Short Term Goals: Without prompting or encouragement patient will spontaneously contribute to discussions during at least 2 recreation therapy group sessions by conclusion of recreation therapy tx.  Treatment Modalities: Group and Pet Therapy  Therapeutic Interventions: Psychoeducation  Evaluation of Outcomes: Progressing   LCSW Treatment Plan for Primary Diagnosis: MDD (major depressive disorder), single episode, severe with psychotic features ([place]) Long Term Goal(s): Safe transition to appropriate next level of care at discharge, Engage patient in therapeutic group addressing interpersonal concerns.  Short Term Goals: Engage patient in aftercare planning with referrals and resources  Therapeutic Interventions: Assess for all discharge needs, 1 to 1 time with Social worker, Explore available resources and support systems, Assess for adequacy in community support network, Educate family and significant other(s) on suicide prevention, Complete Psychosocial  Assessment, Interpersonal group therapy.  Evaluation of Outcomes: Met  Return home, follow up IOP   Progress in Treatment: Attending groups: Yes Participating in groups: Yes Taking medication as prescribed: Yes Toleration medication: Yes, no side effects reported at this time Family/Significant other contact made: Yes Patient understands diagnosis: No  Limited insight Discussing patient identified problems/goals with staff: Yes Medical problems stabilized or resolved: Yes Denies suicidal/homicidal ideation: Yes Issues/concerns per patient self-inventory: None Other: N/A  New problem(s) identified: None identified at this time.   New Short Term/Long Term Goal(s): None identified at this time.   Discharge Plan or Barriers: Pt plans to return home and follow up with outpatient.    Reason for Continuation of Hospitalization: Depression Hallucinations Medication stabilization Paranoia    Estimated Length of Stay: 3-5 days  Attendees: Patient: 10/15/2016  1:24 PM  Physician: Neita Garnet, MD 10/15/2016  1:24 PM  Nursing: Hoy Register, RN 10/15/2016  1:24 PM  RN Care Manager: Lars Pinks, RN 10/15/2016  1:24 PM  Social Worker: Wray Kearns, [place] 10/15/2016  1:24 PM  Recreational Therapist: Laretta Bolster  10/15/2016  1:24 PM  Other: Norberto Sorenson 10/15/2016  1:24 PM  Other:  10/15/2016  1:24 PM    Scribe for Treatment Team:  Wray Kearns, LCSWA

## 2016-10-15 NOTE — [facility] Counselor (Signed)
CSW spoke with pt's wife. She is aware of potential discharge for tomorrow.   Daisy FloroCandace L Ellyana Crigler MSW, LCSWA  10/15/2016 3:36 PM

## 2016-10-15 NOTE — Progress Notes (Signed)
[facility] MD Progress Note  10/15/2016 1:01 PM Jerry Thornton  MRN:  [id number]  Subjective: Jerry Thornton states " I am a bit worried about my status as IVC ed . I am ok otherwise. I am not preoccupied with my paranoia."    Objective: Patient seen and chart reviewed.Discussed patient with treatment team.  Patient today seen as calm, less anxious, more alert. Pt denies any concerns with medications and his sleep has improved. Pt had questions about his IVC status . Pt was provided by voluntary admission form by staff. Pt reassured.     Principal Problem: MDD (major depressive disorder), single episode, severe with psychotic features ([facility]) Diagnosis:   Patient Active Problem List   Diagnosis Date Noted  . Generalized anxiety disorder [F41.1] 10/03/2016  . MDD (major depressive disorder), single episode, severe with psychotic features ([facility]) [F32.3] 10/03/2016   Total Time spent with patient: 20  minutes   Past Psychiatric History: Please see H&P.   Past Medical History:  Past Medical History:  Diagnosis Date  . Allergy   . Depression    History reviewed. No pertinent surgical history. Family History:  Family History  Problem Relation Age of Onset  . Mental illness Father   . Heart disease Paternal Grandfather   . Mental illness Brother   . Suicidality Brother    Family Psychiatric  History: Father has depression, his brother shot self and died 2 and 1/2 yrs ago.   Social History: Please see H&P.  History  Alcohol Use  . Yes    Comment: 2 drinks/week     History  Drug Use No    Social History   Social History  . Marital status: Married    Spouse name: N/A  . Number of children: N/A  . Years of education: N/A   Social History Main Topics  . Smoking status: Never Smoker  . Smokeless tobacco: Never Used  . Alcohol use Yes     Comment: 2 drinks/week  . Drug use: No  . Sexual activity: Yes   Other Topics Concern  . None   Social History Narrative  . None    Additional Social History:    Pain Medications: none Prescriptions: none Over the Counter: none History of alcohol / drug use?: No history of alcohol / drug abuse  Sleep: fair  Appetite:  fair   Current Medications: Current Facility-Administered Medications  Medication Dose Route Frequency Provider Last Rate Last Dose  . acetaminophen (TYLENOL) tablet 650 mg  650 mg Oral Q6H PRN Beau Fanny, FNP      . alum & mag hydroxide-simeth (MAALOX/MYLANTA) 200-200-20 MG/5ML suspension 30 mL  30 mL Oral Q4H PRN Beau Fanny, FNP      . ARIPiprazole (ABILIFY) tablet 20 mg  20 mg Oral QHS Jomarie Longs, MD   20 mg at 10/14/16 2211  . escitalopram (LEXAPRO) tablet 20 mg  20 mg Oral Daily Jomarie Longs, MD   20 mg at 10/15/16 0831  . LORazepam (ATIVAN) tablet 1 mg  1 mg Oral Q6H PRN Craige Cotta, MD   1 mg at 10/13/16 0747   Or  . LORazepam (ATIVAN) injection 1 mg  1 mg Intramuscular Q6H PRN Rockey Situ Cobos, MD      . LORazepam (ATIVAN) tablet 0.5 mg  0.5 mg Oral BH-q8a5phs Shylie Polo, MD      . magnesium hydroxide (MILK OF MAGNESIA) suspension 30 mL  30 mL Oral Daily PRN Beau Fanny, FNP      .  traZODone (DESYREL) tablet 100 mg  100 mg Oral QHS Jomarie LongsSaramma Tajia Szeliga, MD   Stopped at 10/14/16 2212  . ziprasidone (GEODON) capsule 20 mg  20 mg Oral TID PRN Jomarie LongsSaramma Lakina Mcintire, MD   20 mg at 10/13/16 16100333   Or  . ziprasidone (GEODON) injection 10 mg  10 mg Intramuscular TID PRN Jomarie LongsSaramma Danel Studzinski, MD       Lab Results:  No results found for this or any previous visit (from the past 48 hour(s)).  Blood Alcohol level:  Lab Results  Component Value Date   ETH <5 10/02/2016   Metabolic Disorder Labs: Lab Results  Component Value Date   HGBA1C 5.0 10/05/2016   MPG 97 10/05/2016   Lab Results  Component Value Date   PROLACTIN 10.8 10/05/2016   Lab Results  Component Value Date   CHOL 190 10/05/2016   TRIG 108 10/05/2016   HDL 59 10/05/2016   CHOLHDL 3.2 10/05/2016   VLDL 22  10/05/2016   LDLCALC 109 (H) 10/05/2016   Physical Findings: AIMS:  , ,  ,  ,    CIWA:    COWS:     Musculoskeletal: Strength & Muscle Tone: within normal limits Gait & Station: normal Patient leans: N/A  Psychiatric Specialty Exam: Physical Exam  Nursing note and vitals reviewed.   Review of Systems  Psychiatric/Behavioral: Positive for depression. The patient is nervous/anxious.   All other systems reviewed and are negative.   Blood pressure 103/65, pulse 71, temperature 98.4 F (36.9 C), resp. rate 16, height 6\' 2"  (1.88 m), weight 97.5 kg (215 lb).Body mass index is 27.6 kg/m.  General Appearance: fair  Eye Contact: good  Speech:  Normal Rate  Volume: fair  Mood:anxious  Affect: congruent  Thought Process: linear  Orientation:  Full (Time, Place, and Person)  Thought Content: rumination  Suicidal Thoughts:  No   Homicidal Thoughts:  No   Memory: recent - fair, remote - fair, immediate - fair  Judgement: fair  Insight: fair  Psychomotor Activity:  normal  Concentration:  Concentration: Fair and Attention Span: Fair  Recall:  [employer]Fair  Fund of Knowledge:  Fair  Language:  Fair  Akathisia:  No  Handed:  Right  AIMS (if indicated):     Assets:  Desire for Improvement  ADL's: fair  Cognition:  WNL  Sleep:  Number of Hours: 5.75    Assessment - Patient today seen as less paranoid , less anxious - making progress.     Treatment Plan Summary:   MDD (major depressive disorder), single episode, severe with psychotic features (HCC) unstable - improving   Will continue today 10/15/16  plan as below except where it is noted.  Daily contact with patient to assess and evaluate symptoms and progress in treatment and Medication management  Reviewed past medical records,treatment plan.   For Depression: Will continue Lexapro  20 mg po daily.  For psychosis: Will continue  Abilify 20  mg po qhs.  For insomnia: Trazodone 100 mg po qhs PRN for insomnia   For  anxiety sx: Will reduce Ativan 0.5 mg po to bid  .Could taper down as he improves.  For anxiety/agitation: Continue Ativan 1 mgr po Q 6 hours PRN for severe anxiety  Continue to encourage increased group and milieu participation to work on coping skills and symptom reduction  Treatment team working on disposition planning options  CSW will continue working on disposition.  Jaydee Conran, MD 10/15/2016, 1:01 PM  Patient ID: Jerry Thornton, male  DOB: January 12, 1966, 50 y.o.   MRN: [id number]   I certify that the services received since the previous certification/recertification were and continue to be medically necessary as the treatment provided can be reasonably expected to improve the patient's condition; the medical record documents that the services furnished were intensive treatment services or their equivalent services, and this patient continues to need, on a daily basis, active treatment furnished directly by or requiring the supervision of inpatient psychiatric personnel.

## 2016-10-16 MED ORDER — TRAZODONE HCL 100 MG PO TABS: 100.0000 mg | ORAL_TABLET | Freq: Every day | ORAL | 0 refills | Status: DC

## 2016-10-16 MED ORDER — ARIPIPRAZOLE 20 MG PO TABS: 20.0000 mg | ORAL_TABLET | Freq: Every day | ORAL | 0 refills | Status: DC

## 2016-10-16 MED ORDER — ESCITALOPRAM OXALATE 20 MG PO TABS: 20.0000 mg | ORAL_TABLET | Freq: Every day | ORAL | 0 refills | Status: DC

## 2016-10-16 MED ORDER — LORAZEPAM 0.5 MG PO TABS: 0.5000 mg | ORAL_TABLET | ORAL | 0 refills | Status: DC

## 2016-10-16 NOTE — Progress Notes (Signed)
D: Pt is flat isolative and withdrawn to room: Pt was in bed all night. Pt at the time of assessment endorses mild depression and anxiety, "I'm getting there." Pt however, denies pain, SI, HI or AVH. Pt remained calm and cooperative.  A: Medications offered as prescribed.  Support, encouragement, and safe environment provided.  15-minute safety checks continue. R: Pt was med compliant.  Pt did attend wrap-up group. Safety checks continue.

## 2016-10-16 NOTE — Progress Notes (Signed)
Data. Patient denies SI/HI/AVH. Patient interacting more with staff and other patients. Affect continues to be blunt, but smiles with interaction today. Patient continues to get confused easily with new information and he reports this causes his anxiety to increase. On his self assessment he reports 5/10 for anxiety, depression and hopelessness. His goal for today is: "Stay aware and present-in the moment. Lowering anxiety".  Action. Emotional support and encouragement offered. Education provided on medication, indications and side effect. Q 15 minute checks done for safety. Response. Safety on the unit maintained through 15 minute checks.  Medications taken as prescribed. Attended groups. Remained calm and appropriate through out shift.  Patient was discharged to lobby, after meeting with his wife to go over discharge instructions.  Belongings sheet reviewed and signed by patient, and all belongings sent home, including sample medications and scripts. Paperwork reviewed and patient and wife and both were able to verbalize understanding of education. Patient in no current distress and ambulatory.

## 2016-10-16 NOTE — Progress Notes (Signed)
  [facility]  Will you be returning to the same living situation after discharge:  Yes,  home with family At discharge, do you have transportation home?: Yes,  wife Do you have the ability to pay for your medications: Yes,  insurance, income  Release of information consent forms completed and in the chart;  Patient's signature needed at discharge.  Patient to Follow up at: Follow-up Information    [facility] Follow up on 10/25/2016.   Specialty:  Behavioral Health Why:  Initial appointment with Jeri Modenaita Clark is on Jan. 8th at 9:00am.  This will be your first day of Intensive Outpatient group, which runs 5 days a week, M-F between 9 and noon.  There is also a psychiatrist linked to this program. Contact information: [address] [place] [id number] [phone]          Next level [facility] care provider [facility]:yes  Safety Planning and Suicide Prevention discussed: No.  N/A  Have you used any form of tobacco in the last 30 days? (Cigarettes, Smokeless Tobacco, Cigars, and/or Pipes): No  Has patient been referred to the Quitline?: N/A patient is not a smoker  Patient has been referred for addiction treatment: N/A  Lynnell ChadMareida J Grossman-Orr 10/16/2016, 11:46 AM

## 2016-10-16 NOTE — Discharge Summary (Signed)
Physician Discharge Summary Note  Patient:  Jerry Thornton is an 50 y.o., male MRN:  [id number] DOB:  August 20, 1966 Patient phone:  [phone] (home)  Patient address:   [address] [place] [id number],  Total Time spent with patient: 45 minutes  Date of Admission:  10/03/2016 Date of Discharge: 10/16/16  Reason for Admission:   Per Tele- assessment note-Jerry Thornton a 50 y.o.malewho [facility] a [facility] with complaint of depressive symptoms and rapid mood changes. He was accompanied by his wife, who requested the assessment. Pt's wife expressed concern that Pt is behaving strangely and may be heading toward a psychotic break.  Wife reported as follows: She stated that 20 years ago, Pt experienced a brief psychotic episode for which he was hospitalized. He had another 'episode' about 12 years ago. At that time, he did not go to the hospital because he was under psychiatric care (psychiatrist in [place]). Wife reported that over the last few days, Pt has 'not been himself' -- he sometimes cries, acts confused, forgets words, and then is calm/composed again. Wife expressed concern because Pt's family has a history of depression and anxiety, and his brother committed suicide two years ago. Wife said she suspects that Pt is stressed over work. Pt himself denied any suicidal or homicidal ideation, auditory/visual hallucination, substance use, or self-injury. Pt seemed very guarded and referred several questions such as "What do you want to see done today?" to wife. Pt expressed a desire to go home, stating that he felt safe. Wife stated that she feels 'mostly' safe with Pt at home, but she is concerned about his mood.   After assessing Pt, Jerry Thornton was advised that hospital staff believed Pt was paranoid because of strange comments he made during blood draw, to the effect of "You better get that blood." Per wife, Pt is not under any psychiatric care  currently. Also per wife, [facility].  Pt was dressed in street clothes and appeared appropriately groomed. He had fair eye contact -- he made eye contact with author, but stared at wife and referred questions to her. Pt's mood was empty, and affect was flat. Pt's demeanor was calm. He denied suicidal ideation, homicidal ideation, auditory/visual hallucination, and substance use. He denied other depressive symptoms as well, although (per wife), he has episodes of sadness and tearfulness. Pt's speech was spare, soft, and regular in rhythm and rate. Thought processes were within normal range. Thought content seemed logical and goal-oriented. He responded to questions appropriately, often referring to wife. Pt's memory and concentration were grossly intact. Pt's insight, judgment, and impulse control were deemed fair.   Principal Problem: MDD (major depressive disorder), single episode, severe with psychotic features [facility](HCC) Discharge Diagnoses: Patient Active Problem List   Diagnosis Date Noted  . Generalized anxiety disorder [F41.1] 10/03/2016  . MDD (major depressive disorder), single episode, severe with psychotic features (HCC) [F32.3] 10/03/2016    Past Psychiatric History: see H&P  Past Medical History:  Past Medical History:  Diagnosis Date  . Allergy   . Depression    History reviewed. No pertinent surgical history. Family History:  Family History  Problem Relation Age of Onset  . Mental illness Father   . Heart disease Paternal Grandfather   . Mental illness Brother   . Suicidality Brother    Family Psychiatric  History: see H&P Social History:  History  Alcohol Use  . Yes    Comment:  2 drinks/week     History  Drug Use No    Social History   Social History  . Marital status: Married    Spouse name: N/A  . Number of children: N/A  . Years of education: N/A   Social History Main Topics  . Smoking  status: Never Smoker  . Smokeless tobacco: Never Used  . Alcohol use Yes     Comment: 2 drinks/week  . Drug use: No  . Sexual activity: Yes   Other Topics Concern  . None   Social History Narrative  . None    Hospital Course:   Jerry Thornton was admitted for MDD (major depressive disorder), single episode, severe with psychotic features (HCC) , with psychosis and crisis management.  Pt was treated discharged with the medications listed below under Medication List.  Medical problems were identified and treated as needed.  Home medications were restarted as appropriate.  Improvement was monitored by observation and Jerry Thornton 's daily report of symptom reduction.  Emotional and mental status was monitored by daily self-inventory reports completed by Jerry Thornton and clinical staff.         Jerry Thornton was evaluated by the treatment team for stability and plans for continued recovery upon discharge. Jerry Thornton 's motivation was an integral factor for scheduling further treatment. Employment, transportation, bed availability, health status, family support, and any pending legal issues were also considered during hospital stay. Pt was offered further treatment options upon discharge including but not limited to Residential, Intensive Outpatient, and Outpatient treatment.  Jerry Thornton will follow up with the services as listed below under Follow Up Information.     Upon completion of this admission the patient was both mentally and medically stable for discharge denying suicidal/homicidal ideation, auditory/visual/tactile hallucinations, delusional thoughts and paranoia.    Family session went well. No seclusion or restraint.  Jerry Thornton responded well to treatment with abilify, lexapro, ativan, trazodone without adverse effects. Pt demonstrated improvement without reported or observed adverse effects to the point of stability appropriate for outpatient management.  Pertinent labs include: LDL 109H for which outpatient follow-up is necessary for lab recheck as mentioned below. Reviewed CBC, CMP, BAL, and UDS; all unremarkable aside from noted exceptions.    Physical Findings: AIMS:  , ,  ,  ,    CIWA:    COWS:     Musculoskeletal: Strength & Muscle Tone: within normal limits Gait & Station: normal Patient leans: N/A  Psychiatric Specialty Exam: Physical Exam  Review of Systems  Psychiatric/Behavioral: Positive for depression. Negative for hallucinations and suicidal ideas. The patient is nervous/anxious and has insomnia.   All other systems reviewed and are negative.   Blood pressure 106/67, pulse 75, temperature 98.6 F (37 C), temperature source Oral, resp. rate 16, height 6\' 2"  (1.88 m), weight 97.5 kg (215 lb).Body mass index is 27.6 kg/m.  SEE MD PSE WITHIN SRA   Have you used any form of tobacco in the last 30 days? (Cigarettes, Smokeless Tobacco, Cigars, and/or Pipes): No  Has this patient used any form of tobacco in the last 30 days? (Cigarettes, Smokeless Tobacco, Cigars, and/or Pipes) No   Blood Alcohol level:  Lab Results  Component Value Date   ETH <5 10/02/2016    Metabolic Disorder Labs:  Lab Results  Component Value Date   HGBA1C 5.0 10/05/2016   MPG 97 10/05/2016   Lab Results  Component Value Date   PROLACTIN 10.8 10/05/2016   Lab Results  Component Value Date   CHOL 190 10/05/2016   TRIG 108 10/05/2016   HDL 59 10/05/2016   CHOLHDL 3.2 10/05/2016   VLDL 22 10/05/2016   LDLCALC 109 (H) 10/05/2016    See Psychiatric Specialty Exam and Suicide Risk Assessment completed by Attending Physician prior to discharge.  Discharge destination:  Home  Is patient on multiple antipsychotic therapies at discharge:  No   Has Patient had three or more failed trials of antipsychotic monotherapy by history:  No  Recommended Plan for Multiple Antipsychotic Therapies: NA   Allergies as of 10/16/2016   No Known  Allergies     Medication List    STOP taking these medications   ibuprofen 200 MG tablet Commonly known as:  ADVIL,MOTRIN   multivitamin with minerals tablet     TAKE these medications     Indication  ARIPiprazole 20 MG tablet Commonly known as:  ABILIFY Take 1 tablet (20 mg total) by mouth at bedtime.  Indication:  psychosis   escitalopram 20 MG tablet Commonly known as:  LEXAPRO Take 1 tablet (20 mg total) by mouth daily. Start taking on:  10/17/2016  Indication:  Generalized Anxiety Disorder, Major Depressive Disorder   FISH OIL PO Take 1 capsule by mouth daily. Reported on 04/26/2016  Indication:  High Amount of Fats in the Blood   LORazepam 0.5 MG tablet Commonly known as:  ATIVAN Take 1 tablet (0.5 mg total) by mouth as directed. Twice daily as needed for 7 days (withdrawal), once daily as needed for 7 days  Indication:  Alcohol Withdrawal Syndrome   traZODone 100 MG tablet Commonly known as:  DESYREL Take 1 tablet (100 mg total) by mouth at bedtime.  Indication:  Trouble Sleeping, Major Depressive Disorder      Follow-up Information    [facility] Follow up on 10/25/2016.   Specialty:  Behavioral Health Why:  Initial appointment with Jeri Modena is on Jan. 8th at 9:00am.  This will be your first day of Intensive Outpatient group, which runs 5 days a week, M-F between 9 and noon.  There is also a psychiatrist linked to this program. Contact information: [address] [place] [address] [phone]          Follow-up recommendations:  Activity:  As tolerated Diet:  Heart healthy with low sodium.  Comments:   Take all medications as prescribed. Keep all follow-up appointments as scheduled.  Do not consume alcohol or use illegal drugs while on prescription medications. Report any adverse effects from your medications to your primary care provider promptly.  In the event of recurrent symptoms  or worsening symptoms, call 911, a crisis hotline, or go to the nearest emergency department for evaluation.   Signed:  Beau Fanny, FNP 10/16/2016, 10:11 AM

## 2016-10-16 NOTE — [facility] Suicide Risk Assessment (Signed)
[facility]   Principal Problem: MDD (major depressive disorder), single episode, severe with psychotic features [facility](HCC) Discharge Diagnoses:  Patient Active Problem List   Diagnosis Date Noted  . Generalized anxiety disorder [F41.1] 10/03/2016  . MDD (major depressive disorder), single episode, severe with psychotic features (HCC) [F32.3] 10/03/2016    Total Time spent with patient: 30 minutes  Musculoskeletal: Strength & Muscle Tone: within normal limits Gait & Station: normal Patient leans: N/A  Psychiatric Specialty Exam: Review of Systems  Psychiatric/Behavioral: Negative for depression, hallucinations and suicidal ideas. The patient is not nervous/anxious.   All other systems reviewed and are negative.   Blood pressure 106/67, pulse 75, temperature 98.6 F (37 C), temperature source Oral, resp. rate 16, height 6\' 2"  (1.88 m), weight 97.5 kg (215 lb).Body mass index is 27.6 kg/m.  General Appearance: Fairly Groomed  Patent attorneyye Contact::  Fair  Speech:  Clear and Coherent409  Volume:  Normal  Mood:  Euthymic  Affect:  Congruent  Thought Process:  Goal Directed and Descriptions of Associations: Intact  Orientation:  Full (Time, Place, and Person)  Thought Content:  Logical  Suicidal Thoughts:  No  Homicidal Thoughts:  No  Memory:  Immediate;   Fair Recent;   Fair Remote;   Fair  Judgement:  Fair  Insight:  Fair  Psychomotor Activity:  Normal  Concentration:  Fair  Recall:  FiservFair  Fund of Knowledge:Fair  Language: Fair  Akathisia:  No  Handed:  Right  AIMS (if indicated):     Assets:  Desire for Improvement  Sleep:  Number of Hours: 5.75  Cognition: WNL  ADL's:  Intact   Mental Status Per Nursing Assessment::   On Admission:  NA  Demographic Factors:  Male and Caucasian  Loss Factors: NA  Historical Factors: Impulsivity  Risk Reduction Factors:   Positive social support, Positive therapeutic relationship and Positive coping skills  or problem solving skills  Continued Clinical Symptoms:  Previous Psychiatric Diagnoses and Treatments  Cognitive Features That Contribute To Risk:  None    Suicide Risk:  Minimal: No identifiable suicidal ideation.  Patients presenting with no risk factors but with morbid ruminations; may be classified as minimal risk based on the severity of the depressive symptoms  Follow-up Information    [facility] Follow up on 10/25/2016.   Specialty:  Behavioral Health Why:  Initial appointment with Jeri Modenaita Clark is on Jan. 8th at 9:00am.  This will be your first day of Intensive Outpatient group, which runs 5 days a week, M-F between 9 and noon.  There is also a psychiatrist linked to this program. Contact information: [address] [place] [id number] [phone]          Plan Of Care/Follow-up recommendations:  Activity:  no restrictions Other:  diet - regular. Please taper down your ativan as discussed. You could reduce it to 0.5 mg po at bedtime after 7 days and then slowly taper down and stop using it .  Elliot Simoneaux, MD 10/16/2016, 9:11 AM

## 2016-10-16 NOTE — [facility] Group Notes (Signed)
[facility] Group Notes:  (Nursing/MHT/Case Management/Adjunct)  Date:  10/16/2016  Time:  10:17 AM  Type of Therapy:  Nurse Education  Participation Level:  Minimal  Participation Quality:  Minimal  Affect:  Blunted  Cognitive:  Appropriate  Insight:  Limited  Engagement in Group:  Limited  Modes of Intervention:  Discussion  Summary of Progress/Problems: Patient goals were to work on ADL's and coping skills.   Vinetta BergamoBarbara M Orlando Devereux 10/16/2016, 10:17 AM

## 2016-10-16 NOTE — [facility] Group Notes (Signed)
[facility] Group Notes:  (Clinical Social Work)  10/16/2016     11:00-11:45AM  Summary of Progress/Problems:   The main focus of today's process group was to help patients explore in depth the perceived benefits and costs of unhealthy coping techniques, as well as to identify healthy coping skills that can help patients to stay well and out of the hospital.  The patient expressed very little, and took a long time to make the few statements he did.    Type of Therapy:  Group Therapy - Process   Participation Level:  Minimal  Participation Quality:  Attentive  Affect:  Anxious  Cognitive:  Confused  Insight:  Improving  Engagement in Therapy:  Improving  Modes of Intervention:  Education, Motivational Interviewing  Jerry Thornton MantleMareida Grossman-Orr, LCSW 10/16/2016, 12:40 PM

## 2016-10-25 ENCOUNTER — Other Ambulatory Visit (HOSPITAL_COMMUNITY): Payer: BLUE CROSS/BLUE SHIELD | Attending: Psychiatry | Admitting: Psychiatry

## 2016-10-25 ENCOUNTER — Encounter (HOSPITAL_COMMUNITY): Payer: Self-pay | Admitting: Psychiatry

## 2016-10-25 DIAGNOSIS — F323 Major depressive disorder, single episode, severe with psychotic features: Secondary | ICD-10-CM

## 2016-10-25 DIAGNOSIS — F332 Major depressive disorder, recurrent severe without psychotic features: Secondary | ICD-10-CM | POA: Diagnosis not present

## 2016-10-25 DIAGNOSIS — F419 Anxiety disorder, unspecified: Secondary | ICD-10-CM | POA: Diagnosis not present

## 2016-10-25 DIAGNOSIS — G4700 Insomnia, unspecified: Secondary | ICD-10-CM | POA: Diagnosis not present

## 2016-10-25 DIAGNOSIS — F4310 Post-traumatic stress disorder, unspecified: Secondary | ICD-10-CM | POA: Diagnosis not present

## 2016-10-25 DIAGNOSIS — Z818 Family history of other mental and behavioral disorders: Secondary | ICD-10-CM | POA: Diagnosis not present
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
## 2016-10-25 NOTE — Progress Notes (Signed)
    Daily Group Progress Note  Program: IOP  Group Time: 9:00-12:00  Participation Level: Minimal  Behavioral Response: Appropriate  Type of Therapy:  Group Therapy  Summary of Progress: Pt. Met with case manager to complete intake assessment. Pt. Was present for medication management group with the pharmacist. Pt. Introduced himself to the group and stated that he was was experiencing stress, Pt. Was not able to elaborate regarding the source of his stress. Pt. Shared with the group that he planned to go running with his dog this afternoon and that this activity helped him to feel better.       Nancie Neas, LPC

## 2016-10-26 ENCOUNTER — Other Ambulatory Visit (HOSPITAL_COMMUNITY): Payer: BLUE CROSS/BLUE SHIELD | Admitting: Psychiatry

## 2016-10-26 DIAGNOSIS — F323 Major depressive disorder, single episode, severe with psychotic features: Secondary | ICD-10-CM

## 2016-10-26 DIAGNOSIS — F332 Major depressive disorder, recurrent severe without psychotic features: Secondary | ICD-10-CM | POA: Diagnosis not present

## 2016-10-26 DIAGNOSIS — Z818 Family history of other mental and behavioral disorders: Secondary | ICD-10-CM | POA: Diagnosis not present

## 2016-10-26 DIAGNOSIS — G4700 Insomnia, unspecified: Secondary | ICD-10-CM | POA: Diagnosis not present

## 2016-10-26 DIAGNOSIS — F419 Anxiety disorder, unspecified: Secondary | ICD-10-CM | POA: Diagnosis not present

## 2016-10-26 DIAGNOSIS — F4310 Post-traumatic stress disorder, unspecified: Secondary | ICD-10-CM | POA: Diagnosis not present
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
## 2016-10-26 NOTE — Progress Notes (Signed)
Jerry Thornton is a  51 yr old, employed, married, Caucasian male who [facility]; admitted due to MDD with psychotic features.  One prior psych admit in 1999 (adjustment d/o).  Denies any prior suicide attempts or gestures.  Currently denies SI/HI or A/V hallucinations.  Pt was very guarded and slow to answer questions.  He kept redirecting my questions to his wife.  "I don't quite know...you may want to check with my wife."  Stressors/Triggers:  1)  Unresolved grief/loss issues:  brother shot himself two yrs ago.  He suffered with depression.  2)  Conflictual Relationship with brother Renae Fickle(Paul) since other brother's death.  Pt wouldn't go into detail.  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  Been out on disability since 10-04-16.  States he works very long hrs and is stressed out.  *CC:  Inpt chart for more chart information                                                  Patients Currently Reported Symptoms/Problems: poor concentration, no motivation, low energy, isolative, feelings of hopelessness, helplessness and worthlessness, anhedonia, indecisiveness, sadness. Today, pt arrived wanting to meet with Clinical research associatewriter and psychiatrist.  Pt states he talked yesterday with his wife about MH-IOP and decided that MH-IOP wasn't the right fit for him at this time.  Pt is requesting individual counseling.  A:  D/C today per pt's request.  F/U with Elana AlmFrankie Powell, LCSW on 11-15-16 @ 8 am and Dr. Lolly MustacheArfeen on 11-22-16 @ 11 a.m.  Encouraged support groups.  R:  Pt receptive.     Chestine SporeLARK, [name], M.Ed, CNA

## 2016-10-26 NOTE — Patient Instructions (Signed)
Pt decided not to continue in MH-IOP.  Requesting individual counseling.  Denies SI/HI or A/V hallucinations.  F/U with Elana AlmFrankie Powell, LCSW on 11-15-16 and Dr. Lolly MustacheArfeen on 11-22-16.

## 2016-10-27 ENCOUNTER — Other Ambulatory Visit (HOSPITAL_COMMUNITY): Payer: BLUE CROSS/BLUE SHIELD | Admitting: Psychiatry

## 2016-10-28 ENCOUNTER — Other Ambulatory Visit (HOSPITAL_COMMUNITY): Payer: BLUE CROSS/BLUE SHIELD

## 2016-10-28 NOTE — Progress Notes (Signed)
    Daily Group Progress Note  Program: IOP  Group Time: 9:00-12:00  Participation Level: Active  Behavioral Response: Appropriate  Type of Therapy:  Group Therapy  Summary of Progress: Pt. Completed assessment with case manager and psychiatrist.    Shaune PollackBrown, Jennifer B, [facility]

## 2016-10-29 ENCOUNTER — Other Ambulatory Visit (HOSPITAL_COMMUNITY): Payer: BLUE CROSS/BLUE SHIELD

## 2016-11-01 ENCOUNTER — Other Ambulatory Visit (HOSPITAL_COMMUNITY): Payer: BLUE CROSS/BLUE SHIELD

## 2016-11-02 ENCOUNTER — Other Ambulatory Visit (HOSPITAL_COMMUNITY): Payer: BLUE CROSS/BLUE SHIELD

## 2016-11-03 ENCOUNTER — Other Ambulatory Visit (HOSPITAL_COMMUNITY): Payer: BLUE CROSS/BLUE SHIELD

## 2016-11-04 ENCOUNTER — Other Ambulatory Visit (HOSPITAL_COMMUNITY): Payer: BLUE CROSS/BLUE SHIELD

## 2016-11-05 ENCOUNTER — Other Ambulatory Visit (HOSPITAL_COMMUNITY): Payer: BLUE CROSS/BLUE SHIELD

## 2016-11-08 ENCOUNTER — Other Ambulatory Visit (HOSPITAL_COMMUNITY): Payer: BLUE CROSS/BLUE SHIELD

## 2016-11-09 ENCOUNTER — Other Ambulatory Visit (HOSPITAL_COMMUNITY): Payer: BLUE CROSS/BLUE SHIELD

## 2016-11-10 ENCOUNTER — Other Ambulatory Visit (HOSPITAL_COMMUNITY): Payer: BLUE CROSS/BLUE SHIELD

## 2016-11-11 ENCOUNTER — Other Ambulatory Visit (HOSPITAL_COMMUNITY): Payer: BLUE CROSS/BLUE SHIELD

## 2016-11-12 ENCOUNTER — Other Ambulatory Visit (HOSPITAL_COMMUNITY): Payer: BLUE CROSS/BLUE SHIELD

## 2016-11-15 ENCOUNTER — Encounter (HOSPITAL_COMMUNITY): Payer: Self-pay | Admitting: Clinical

## 2016-11-15 ENCOUNTER — Other Ambulatory Visit (HOSPITAL_COMMUNITY): Payer: BLUE CROSS/BLUE SHIELD

## 2016-11-15 ENCOUNTER — Ambulatory Visit (INDEPENDENT_AMBULATORY_CARE_PROVIDER_SITE_OTHER): Payer: BLUE CROSS/BLUE SHIELD | Admitting: Clinical

## 2016-11-15 DIAGNOSIS — F333 Major depressive disorder, recurrent, severe with psychotic symptoms: Secondary | ICD-10-CM | POA: Diagnosis not present
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
## 2016-11-16 ENCOUNTER — Other Ambulatory Visit (HOSPITAL_COMMUNITY): Payer: BLUE CROSS/BLUE SHIELD

## 2016-11-17 ENCOUNTER — Other Ambulatory Visit (HOSPITAL_COMMUNITY): Payer: BLUE CROSS/BLUE SHIELD

## 2016-11-18 ENCOUNTER — Other Ambulatory Visit (HOSPITAL_COMMUNITY): Payer: BLUE CROSS/BLUE SHIELD

## 2016-11-19 ENCOUNTER — Other Ambulatory Visit (HOSPITAL_COMMUNITY): Payer: BLUE CROSS/BLUE SHIELD

## 2016-11-22 ENCOUNTER — Ambulatory Visit (INDEPENDENT_AMBULATORY_CARE_PROVIDER_SITE_OTHER): Payer: BLUE CROSS/BLUE SHIELD | Admitting: Psychiatry

## 2016-11-22 ENCOUNTER — Encounter (HOSPITAL_COMMUNITY): Payer: Self-pay | Admitting: Psychiatry

## 2016-11-22 ENCOUNTER — Other Ambulatory Visit (HOSPITAL_COMMUNITY): Payer: BLUE CROSS/BLUE SHIELD

## 2016-11-22 VITALS — BP 108/72 | HR 67 | Ht 74.0 in | Wt 233.0 lb

## 2016-11-22 DIAGNOSIS — Z79899 Other long term (current) drug therapy: Secondary | ICD-10-CM

## 2016-11-22 DIAGNOSIS — F323 Major depressive disorder, single episode, severe with psychotic features: Secondary | ICD-10-CM | POA: Diagnosis not present

## 2016-11-22 MED ORDER — ESCITALOPRAM OXALATE 20 MG PO TABS: 20.0000 mg | ORAL_TABLET | Freq: Every day | ORAL | 0 refills | Status: DC
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
## 2016-11-23 ENCOUNTER — Encounter (HOSPITAL_COMMUNITY): Payer: Self-pay | Admitting: Clinical

## 2016-11-23 ENCOUNTER — Other Ambulatory Visit (HOSPITAL_COMMUNITY): Payer: BLUE CROSS/BLUE SHIELD

## 2016-11-23 ENCOUNTER — Ambulatory Visit (INDEPENDENT_AMBULATORY_CARE_PROVIDER_SITE_OTHER): Payer: BLUE CROSS/BLUE SHIELD | Admitting: Clinical

## 2016-11-23 DIAGNOSIS — F333 Major depressive disorder, recurrent, severe with psychotic symptoms: Secondary | ICD-10-CM | POA: Diagnosis not present

## 2016-11-23 NOTE — Progress Notes (Signed)
   THERAPIST PROGRESS NOTE  Session Time: 7:10 - 8:08  Participation Level: Active  Behavioral Response: CasualAlertDepressed  Type of Therapy: Individual Therapy  Treatment Goals addressed: improve psychiatric symptoms, elevate mood, improve unhelpful thought patterns,  emotional regulation skills, learn about diagnosis , healthy coping skills   Interventions: motivational interviewing, CBT, psychoeducation, grounding and mindfulness techniques  Summary: Jerry Thornton is a 51 y.o. male who presents with Severe recurrent major depressive disorder with psychotic features.   Suicidal/Homicidal: Nowithout intent/plan  Therapist Response: Jerry Thornton met with clinician for an individual session. He discussed his psychiatric symptoms, his current life events and his goals for therapy. Jerry Thornton shared that he was feeling slightly better with his medication. He shared that he is still depressed but he couldn't feel any improvement. Clinician asked open ended questions and Jerry Thornton shared that it wasn't is difficult to do daily tasks. Jerry Thornton and clinician discussed his diagnosis and symptoms of his diagnosis. Clinician introduced grounding and mindfulness techniques. Clinician explained the process purpose and practice of the techniques. Jerry Thornton and clinician practiced this the techniques together. Jerry Thornton asked clarifying questions which clinician answered. Clinician introduced some basic CBT concepts. Jerry Thornton and clinician discussed the thought emotion connection. Clinician introduced a 7 panel thought record sheet. Clinician explained the process of completing the worksheet. Jerry Thornton and clinician agreed to complete one of these worksheets at a future session. Out center was given the option of completing one for homework Clinician introduced a homework packet on depression (depression 1 and 2). Jerry Thornton agreed to complete the packets for next session and bring them back with him as well as to  practice his grounding and mindfulness techniques daily.  Plan: Return again in 1 - 2 weeks.  Diagnosis: Axis I: Severe recurrent major depressive disorder with psychotic features.       Mohmed Farver A, LCSW 11/23/2016

## 2016-11-24 ENCOUNTER — Other Ambulatory Visit (HOSPITAL_COMMUNITY): Payer: BLUE CROSS/BLUE SHIELD

## 2016-11-25 ENCOUNTER — Other Ambulatory Visit (HOSPITAL_COMMUNITY): Payer: BLUE CROSS/BLUE SHIELD

## 2016-11-26 ENCOUNTER — Other Ambulatory Visit (HOSPITAL_COMMUNITY): Payer: BLUE CROSS/BLUE SHIELD

## 2016-11-29 ENCOUNTER — Other Ambulatory Visit (HOSPITAL_COMMUNITY): Payer: BLUE CROSS/BLUE SHIELD

## 2016-11-30 ENCOUNTER — Other Ambulatory Visit (HOSPITAL_COMMUNITY): Payer: BLUE CROSS/BLUE SHIELD

## 2016-11-30 ENCOUNTER — Encounter (HOSPITAL_COMMUNITY): Payer: Self-pay | Admitting: Clinical

## 2016-11-30 ENCOUNTER — Ambulatory Visit (INDEPENDENT_AMBULATORY_CARE_PROVIDER_SITE_OTHER): Payer: BLUE CROSS/BLUE SHIELD | Admitting: Clinical

## 2016-11-30 DIAGNOSIS — F333 Major depressive disorder, recurrent, severe with psychotic symptoms: Secondary | ICD-10-CM

## 2016-11-30 NOTE — Progress Notes (Signed)
   THERAPIST PROGRESS NOTE  Session Time: 7:09 -8:00  Participation Level: Active  Behavioral Response: CasualAlertNA  Treatment Goals addressed: improve psychiatric symptoms, elevate mood , improve unhelpful thought patterns, emotional regulation skills (anger and stress management), learn about diagnosis , healthy coping skills  Interventions: motivational interviewing, CBT, psychoeducation,   Summary: Joniel Graumann is a 51 y.o. male who presents with Severe recurrent major depressive disorder with psychotic features.   Suicidal/Homicidal: No without intent/plan  Therapist Response: Dollie met with clinician for an individual session. He discussed his psychiatric symptoms, his current life events and his homework. [place] shared that he was doing overall better better. He shared that he is been practicing his grounding and mindfulness techniques and finding benefit from them. He shared that he completed his homework packet depression 1 and 2. Client and clinician reviewed his homework packet. He shared his thoughts and insights. Client and clinician discussed his diagnosis and symptoms. Clinician introduced a 7 panel thought record sheet. Luismiguel discussed some of his negative automatic thoughts from events that increased his stress or anger level. Clinician asked open ended questions. Japhet identified his emotions and rated them. Orson identified his negative automatic thoughts. Oronde identified the evidence for and against her negative thoughts. Olam Idler was then able to formulate healthier alternative thoughts. Client and clinician discussed how his emotions and actions might change if he believed the healthier alternative thoughts. Zeki agreed to continue practicing his grounding and mindfulness techniques until next session, he agreed to complete depression packet #5, he could agreed to complete some 7 panel thought record sheet and bring them back with him to  next session.  Plan: Return in 1 - 2 weeks.  Diagnosis: Axis I: Severe recurrent major depressive disorder with psychotic features.   Auda Finfrock A, LCSW 11/30/2016

## 2016-12-01 ENCOUNTER — Other Ambulatory Visit (HOSPITAL_COMMUNITY): Payer: BLUE CROSS/BLUE SHIELD

## 2016-12-02 ENCOUNTER — Other Ambulatory Visit (HOSPITAL_COMMUNITY): Payer: BLUE CROSS/BLUE SHIELD

## 2016-12-03 ENCOUNTER — Other Ambulatory Visit (HOSPITAL_COMMUNITY): Payer: BLUE CROSS/BLUE SHIELD

## 2016-12-06 ENCOUNTER — Other Ambulatory Visit (HOSPITAL_COMMUNITY): Payer: BLUE CROSS/BLUE SHIELD

## 2016-12-07 ENCOUNTER — Other Ambulatory Visit (HOSPITAL_COMMUNITY): Payer: BLUE CROSS/BLUE SHIELD

## 2016-12-07 ENCOUNTER — Ambulatory Visit (INDEPENDENT_AMBULATORY_CARE_PROVIDER_SITE_OTHER): Payer: BLUE CROSS/BLUE SHIELD | Admitting: Clinical

## 2016-12-07 DIAGNOSIS — F333 Major depressive disorder, recurrent, severe with psychotic symptoms: Secondary | ICD-10-CM | POA: Diagnosis not present

## 2016-12-08 ENCOUNTER — Other Ambulatory Visit (HOSPITAL_COMMUNITY): Payer: BLUE CROSS/BLUE SHIELD

## 2016-12-08 ENCOUNTER — Encounter (HOSPITAL_COMMUNITY): Payer: Self-pay | Admitting: Clinical

## 2016-12-08 NOTE — Progress Notes (Signed)
   THERAPIST PROGRESS NOTE  Session Time: 7:08 - 8:05  Participation Level: Active  Behavioral Response: CasualAlertDepressed  Type of Therapy: Individual Therapy  Treatment Goals addressed: improve psychiatric symptoms, elevate mood , improve unhelpful thought patterns, emotional regulation skills (stress management), learn about diagnosis , healthy coping skills  Interventions: motivational interviewing, CBT, psychoeducation,   Summary: Lejend Dalby is a 51 y.o. male who presents with Severe recurrent major depressive disorder with psychotic features   Suicidal/Homicidal: No without intent/plan  Therapist Response: Masayoshi met with clinician for an individual session. He discussed his psychiatric symptoms, his current life events and his homework. Jarod shared that he has been feeling a little better. Client and clinician reviewed his homework - 7 panel thought record sheets. Client and clinician discussed the process. Clinician asked open ended questions and Rawson expanded on his answers. Legrande asked clarifying questions which clinician answered. Cove shared some of the negative automatic thoughts he has noticed since completing his homework. Clinician asked open ended questions and Camrin explored the evidence for and against these thoughts. He was then able to formulate healthier alternative thoughts. Camdan and clinician discussed the challenges of having experienced the psychosis. Client and clinician discussed his diagnosis and how it is likely that stress played a major part. Client and clinician discussed some basic stress reduction techniques   Plan: Return in 1 - 2 weeks.  Diagnosis: Axis I: Severe recurrent major depressive disorder with psychotic features.    Baird Polinski A, LCSW 12/08/2016

## 2016-12-09 ENCOUNTER — Other Ambulatory Visit (HOSPITAL_COMMUNITY): Payer: BLUE CROSS/BLUE SHIELD

## 2016-12-10 ENCOUNTER — Other Ambulatory Visit (HOSPITAL_COMMUNITY): Payer: BLUE CROSS/BLUE SHIELD

## 2016-12-13 ENCOUNTER — Other Ambulatory Visit (HOSPITAL_COMMUNITY): Payer: BLUE CROSS/BLUE SHIELD

## 2016-12-14 ENCOUNTER — Ambulatory Visit (INDEPENDENT_AMBULATORY_CARE_PROVIDER_SITE_OTHER): Payer: BLUE CROSS/BLUE SHIELD | Admitting: Clinical

## 2016-12-14 ENCOUNTER — Other Ambulatory Visit (HOSPITAL_COMMUNITY): Payer: BLUE CROSS/BLUE SHIELD

## 2016-12-14 ENCOUNTER — Encounter (HOSPITAL_COMMUNITY): Payer: Self-pay | Admitting: Clinical

## 2016-12-14 DIAGNOSIS — F333 Major depressive disorder, recurrent, severe with psychotic symptoms: Secondary | ICD-10-CM

## 2016-12-14 NOTE — Progress Notes (Signed)
   THERAPIST PROGRESS NOTE  Session Time: 7:07 - 8:05  Participation Level: Active  Behavioral Response: CasualAlertDepressed  Type of Therapy: Individual Therapy  Treatment Goals addressed: improve psychiatric symptoms, elevate mood, improve unhelpful thought patterns, learn about diagnosis , healthy coping skills  Interventions: motivational interviewing, CBT, psychoeducation, grounding and mindfulness techniques  Summary: [name] is a 51 y.o. male who presents with Severe recurrent major depressive disorder with psychotic features.   Suicidal/Homicidal: No without intent/plan  Therapist Response: Cornelius met with clinician for an individual session. He discussed his psychiatric symptoms, his current life events and his homework. [name] shared he has been depressed and has had some passive thoughts of suicide, but feels like he is making progress. [name] denied any current suicidal or homicidal ideation. He shared that the thoughts are fleeting. He shared that he also experience some portion of the day of feeling "okay and motivated." Client and clinician reviewed and discussed his homework packet - Depression 3 and 4. Client and clinician discussed his symptoms and his insights from the homework. Client and clinician discussed his grounding and mindfulness practice. He shared that he found the grounding really helpful when he was able to catch his negative thought cycle. Client and clinician discussed redirecting his thoughts. [name] completed some 7 panel thought record sheets. Which client and cliniiian reviewed. Clinician asked open ended questions and [name] expanded on his answers. Client and clinician reviewed the process and the goal of the exercise. [name] agreed to complete homework packet 5, some 7 panel thought record sheets and continue to practice his grounding and mindfulness techniques  Plan: Return in 1 - 2 weeks.  Diagnosis: Axis I: Severe recurrent  major depressive disorder with psychotic features.    Zakarie Sturdivant A, LCSW 12/14/2016

## 2016-12-15 ENCOUNTER — Other Ambulatory Visit (HOSPITAL_COMMUNITY): Payer: BLUE CROSS/BLUE SHIELD

## 2016-12-16 ENCOUNTER — Other Ambulatory Visit (HOSPITAL_COMMUNITY): Payer: BLUE CROSS/BLUE SHIELD

## 2016-12-17 ENCOUNTER — Other Ambulatory Visit (HOSPITAL_COMMUNITY): Payer: BLUE CROSS/BLUE SHIELD

## 2016-12-22 ENCOUNTER — Ambulatory Visit (INDEPENDENT_AMBULATORY_CARE_PROVIDER_SITE_OTHER): Payer: BLUE CROSS/BLUE SHIELD | Admitting: Clinical

## 2016-12-22 ENCOUNTER — Encounter (HOSPITAL_COMMUNITY): Payer: Self-pay | Admitting: Clinical

## 2016-12-22 DIAGNOSIS — F333 Major depressive disorder, recurrent, severe with psychotic symptoms: Secondary | ICD-10-CM

## 2016-12-22 NOTE — Progress Notes (Signed)
   THERAPIST PROGRESS NOTE  Session Time: 8:10 -9:10  Participation Level: Active  Behavioral Response: CasualAlertDepressed  Type of Therapy: Individual Therapy   Treatment Goals addressed: improve psychiatric symptoms, elevate mood, improve unhelpful thought patterns, interpersonal relationship skills, emotional regulation skills ,  healthy coping skills  Interventions: motivational interviewing, CBT, psychoeducation,  Summary: Adriana Lina is a 51 y.o. male who presents with Severe recurrent major depressive disorder with psychotic features.   Suicidal/Homicidal: No without intent/plan  Therapist Response: Raahim met with clinician for an individual session. He discussed his psychiatric symptoms, his current life events and his homework. He shared that he had completed homework packet but did not bring it. Client and clinician agreed to review it at next session. Blaise shared he had been thinking a lot about his brothers death, he had made a list of negative automatic thoughts and emotions he experiences when he thinks about his death. Client and clinician discussed natural vs manufactured emotions. Client and clinician used 7 panel thought record sheets to challenge some of his negative thoughts. Clinician asked open ended questions. He identified the thoughts, rated his emotions, he identified the evidence for and against the thoughts. He was then able to formulate healthier alternative thoughts. Client and clinician discussed the process. Client and clinician discussed the difference between the emotions that come from the truth versus those that come from a narrative we create. Azarion agreed to complete a 7 panel a day until next session and to continue practicing his grounding and mindfulness techniques.   Depression packet 5  Plan: Return in 1 - 2 weeks.  Diagnosis: Axis I: Severe recurrent major depressive disorder with psychotic features.    Madalen Gavin A,  LCSW 12/22/2016

## 2016-12-28 ENCOUNTER — Ambulatory Visit (INDEPENDENT_AMBULATORY_CARE_PROVIDER_SITE_OTHER): Payer: BLUE CROSS/BLUE SHIELD | Admitting: Clinical

## 2016-12-28 ENCOUNTER — Encounter (HOSPITAL_COMMUNITY): Payer: Self-pay | Admitting: Clinical

## 2016-12-28 DIAGNOSIS — F333 Major depressive disorder, recurrent, severe with psychotic symptoms: Secondary | ICD-10-CM

## 2016-12-28 NOTE — Progress Notes (Signed)
   THERAPIST PROGRESS NOTE  Session Time: 7:00 -7:55  Participation Level: Active  Behavioral Response: CasualAlertDepressed  Treatment Goals addressed: improve psychiatric symptoms, elevate mood , improve unhelpful thought patterns, emotional regulation skills, healthy coping skills  Interventions: motivational interviewing, CBT,   Summary: Jerry Thornton is a 51 y.o. male who presents with Severe recurrent major depressive disorder with psychotic features.   Suicidal/Homicidal: No without intent/plan  Therapist Response: Jerry Thornton met with clinician for an individual session. He discussed his psychiatric symptoms, his current life events and his homework. He shared that he had a low energy/depression week. He shared that he did not do his homework. Clinician asked open ended questions and [name] explored his resistance to completing the homework. Client and clinician discussed the benefits of doing his homework. He agreed to complete it and bring it back to next session. Client and clinician discussed some of the negative thoughts Jerry Thornton was ruminating on this past week which caused him increased depression. Client and clinician used a 7 panel thought record sheet. Clinician asked open ended questions and Jerry Thornton identified the triggers, his emotions ( and rated them), his unhelpful thoughts, the evidence for and against the thoughts. He was then able to formulate healthier alternative thoughts. He reported an improvement in mood. Client and clinician discussed the process. Client and clinician discussed how to approach his homework in the same way.   Plan: Return in 1 - 2 weeks.  Diagnosis: Axis I: Severe recurrent major depressive disorder with psychotic features.    Zoie Sarin A, LCSW 12/28/2016

## 2017-01-04 ENCOUNTER — Ambulatory Visit (INDEPENDENT_AMBULATORY_CARE_PROVIDER_SITE_OTHER): Payer: BLUE CROSS/BLUE SHIELD | Admitting: Clinical

## 2017-01-04 ENCOUNTER — Encounter (HOSPITAL_COMMUNITY): Payer: Self-pay | Admitting: Clinical

## 2017-01-04 DIAGNOSIS — F333 Major depressive disorder, recurrent, severe with psychotic symptoms: Secondary | ICD-10-CM

## 2017-01-04 NOTE — Progress Notes (Signed)
THERAPIST PROGRESS NOTE  Session Time: 9:05 -10:02  Participation Level: Active  Behavioral Response: CasualAlertDysphoric  Treatment Goals addressed: improve psychiatric symptoms, elevate mood, improve unhelpful thought patterns, emotional regulation skills,  healthy coping skills  Interventions: motivational interviewing, CBT,   Summary: Jerry Thornton is a 50 y.o. male who presents with Severe recurrent major depressive disorder with psychotic features.   Suicidal/Homicidal: No without intent/plan  Therapist Response: Pier met with clinician for an individual session. He discussed his psychiatric symptoms, his current life events and his homework. He shared that he had some good days and some challenging days. He shared that he feels he is making progress. He shared that he did complete his homework 1 - 7 panel thought record sheet a day . Client and clinician reviewed and discussed several of these worksheets. Clinician asked open ended questions and [name] expanded on his answers. Client and clinician discussed the process, and how changing his unhelpful thoughts change his emotions and perceptions. Client and clinician discussed his insights and the emotion/thought pattern he recognized.  Maya agreed to complete a worksheet a day until next session.      Plan: Return in 1 - 2 weeks.  Diagnosis: Axis I: Severe recurrent major depressive disorder with psychotic features.    , A, LCSW 01/04/2017  

## 2017-01-11 ENCOUNTER — Ambulatory Visit (INDEPENDENT_AMBULATORY_CARE_PROVIDER_SITE_OTHER): Payer: BLUE CROSS/BLUE SHIELD | Admitting: Clinical

## 2017-01-11 ENCOUNTER — Encounter (HOSPITAL_COMMUNITY): Payer: Self-pay | Admitting: Clinical

## 2017-01-11 DIAGNOSIS — F333 Major depressive disorder, recurrent, severe with psychotic symptoms: Secondary | ICD-10-CM

## 2017-01-11 NOTE — Progress Notes (Signed)
   THERAPIST PROGRESS NOTE  Session Time: 7:07 -8:04  Participation Level: Active  Behavioral Response: CasualAlertDepressed  Treatment Goals addressed: improve psychiatric symptoms, elevate mood ( increased confidence, increased self-esteem), improve unhelpful thought patterns,  learn about diagnosis , healthy coping skills  Interventions: motivational interviewing, CBT, psychoeducation, grounding and mindfulness techniques  Summary: Jerry Thornton is a 51 y.o. male who presents with Severe recurrent major depressive disorder with psychotic features.   Suicidal/Homicidal: No without intent/plan  Therapist Response: Jerry Thornton met with clinician for an individual session. He discussed his psychiatric symptoms, his current life events and his homework. Jerry Thornton shared that he had completed his homework and has been practicing his grounding and mindfulness techniques. Client and clinician reviewed and discussed his homework. . Client and clinician reviewed his 7 panel thought record sheets. He shared his thoughts and insights from the process. He shared about recognizing past unhelping coping patterns. Client and clinician discussed his patterns, how they are formed, how to challenge and change them. Jerry Thornton and clinician discussed how challenging and changing unhelpful patterns might affect his work and interpersonal relationships.  Jerry Thornton shared that he has been feeling better. He shared that he is sometimes resistant to doing the homework but he recognize that it is helping him.   Plan: Return in 1 - 2 weeks.  Diagnosis: Axis I: Severe recurrent major depressive disorder with psychotic features.    Zakariya Knickerbocker A, LCSW 01/11/2017

## 2017-01-18 ENCOUNTER — Ambulatory Visit (INDEPENDENT_AMBULATORY_CARE_PROVIDER_SITE_OTHER): Payer: BLUE CROSS/BLUE SHIELD | Admitting: Clinical

## 2017-01-18 DIAGNOSIS — F331 Major depressive disorder, recurrent, moderate: Secondary | ICD-10-CM
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
## 2017-01-23 ENCOUNTER — Encounter (HOSPITAL_COMMUNITY): Payer: Self-pay | Admitting: Clinical

## 2017-01-23 DIAGNOSIS — F331 Major depressive disorder, recurrent, moderate: Secondary | ICD-10-CM | POA: Insufficient documentation

## 2017-01-23 NOTE — Progress Notes (Signed)
   THERAPIST PROGRESS NOTE  Session Time: 7:07 -8:02  Participation Level: Active  Behavioral Response: CasualAlertDepressed  Treatment Goals addressed: improve psychiatric symptoms, elevate mood, improve unhelpful thought patterns, healthy coping skills  Interventions: motivational interviewing, CBT,   Summary: Jerry Thornton is a 51 y.o. male who presents with recurrent major depressive disorder, moderate.   Suicidal/Homicidal: No without intent/plan  Therapist Response: Jerry Thornton met with clinician for an individual session. He discussed his psychiatric symptoms, his current life events. Client and clinician worked together to update his assessment as his symptoms are improving.  He shared that he has been feeling better as he practices his skills and does his homework. Client and clinician reviewed his homework. He had completed several 7 panel thought record sheets and has continued practicing his grounding and mindfulness techniques. Clinician asked open ended questions and Jerry Thornton shared his 7 panel thought record sheets and his insights. Clinician asked clarifying questions and Jerry Thornton had a new insight about his beliefs about his relationships. Clinician asked open ended questions and Jerry Thornton recognized a painful thought pattern. Jerry Thornton agreed to complete some 7 panel thought record sheets - at least one on the thought pattern he identified.  Plan: Return in 1 - 2 weeks.  Diagnosis:      Axis I:  recurrent major depressive disorder, moderate

## 2017-01-25 ENCOUNTER — Encounter (HOSPITAL_COMMUNITY): Payer: Self-pay | Admitting: Psychiatry

## 2017-01-25 ENCOUNTER — Ambulatory Visit (INDEPENDENT_AMBULATORY_CARE_PROVIDER_SITE_OTHER): Payer: BLUE CROSS/BLUE SHIELD | Admitting: Psychiatry

## 2017-01-25 DIAGNOSIS — Z818 Family history of other mental and behavioral disorders: Secondary | ICD-10-CM

## 2017-01-25 DIAGNOSIS — F323 Major depressive disorder, single episode, severe with psychotic features: Secondary | ICD-10-CM | POA: Diagnosis not present

## 2017-01-25 DIAGNOSIS — Z79899 Other long term (current) drug therapy: Secondary | ICD-10-CM

## 2017-01-25 MED ORDER — ESCITALOPRAM OXALATE 20 MG PO TABS
20.0000 mg | ORAL_TABLET | Freq: Every day | ORAL | 0 refills | Status: DC
Start: 2017-01-25 — End: 2017-04-28

## 2017-01-25 NOTE — Progress Notes (Signed)
BH MD/PA/NP OP Progress Note  01/25/2017 3:58 PM Jerry Thornton  MRN:  [id number]  Chief Complaint:  Subjective:  I am doing good.  Lexapro helping me.  HPI: Jerry Thornton came for his follow-up appointment.  He is taking Lexapro 20 mg daily.  He has no tremors shakes or any EPS.  Since he stopped Abilify his sedation and zombie feeling is gone.  He sleeping good.  He is seeing Jerry Thornton every week.  He still gets sometimes stressed about resuming his work.  He like to restart his work on June 25.  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  His wife is very supportive.  Patient denies any paranoia, hallucination, crying spells, irritability or any anger.  He wants to continue Lexapro.  His energy level is good.  Patient denies drinking alcohol or using any illegal substances.  Visit Diagnosis:    ICD-9-CM ICD-10-CM   1. MDD (major depressive disorder), single episode, severe with psychotic features (HCC) 296.24 F32.3 escitalopram (LEXAPRO) 20 MG tablet    Past Psychiatric History: Reviewed. Patient reported history of psychiatric inpatient treatment in December 2017 due to psychotic break .  He was also admitted in 1999 due to psychosis and depression.  He had a relapse in 2006 but did not required hospitalization.  Patient was seeing Dr. Pincus Thornton in [place]. He was prescribed Wellbutrin and Depakote but he stopped the Depakote due to side effects. He was prescribed Abilify 20 mg, Ativan and trazodone when he released from the hospital in 2017 but he stopped due to weight gain and sedation.  He was recommended intensive outpatient program but he did not attend the program.   Patient denies any history of suicidal attempt.  Past Medical History:  Past Medical History:  Diagnosis Date  . Allergy   . Anxiety   . Depression    No past surgical history on file.  Family Psychiatric History: Reviewed.    Family History:  Family History  Problem Relation Age of  Onset  . Mental illness Father   . Heart disease Paternal Grandfather   . Mental illness Brother   . Suicidality Brother   . Depression Brother     Social History:  Social History   Social History  . Marital status: Married    Spouse name: N/A  . Number of children: N/A  . Years of education: N/A   Social History Main Topics  . Smoking status: Never Smoker  . Smokeless tobacco: Never Used  . Alcohol use No     Comment: 2 drinks/week  . Drug use: No  . Sexual activity: Yes    Partners: Female    Birth control/ protection: None   Other Topics Concern  . Not on file   Social History Narrative  . No narrative on file    Allergies: No Known Allergies  Metabolic Disorder Labs: Lab Results  Component Value Date   HGBA1C 5.0 10/05/2016   MPG 97 10/05/2016   Lab Results  Component Value Date   PROLACTIN 10.8 10/05/2016   Lab Results  Component Value Date   CHOL 190 10/05/2016   TRIG 108 10/05/2016   HDL 59 10/05/2016   CHOLHDL 3.2 10/05/2016   VLDL 22 10/05/2016   LDLCALC 109 (H) 10/05/2016     Current Medications: Current Outpatient Prescriptions  Medication Sig Dispense Refill  . escitalopram (LEXAPRO) 20 MG tablet Take 1 tablet (20 mg total) by mouth daily. 90 tablet 0  .  Omega-3 Fatty Acids (FISH OIL PO) Take 1 capsule by mouth daily. Reported on 04/26/2016     No current facility-administered medications for this visit.     Neurologic: Headache: No Seizure: No Paresthesias: No  Musculoskeletal: Strength & Muscle Tone: within normal limits Gait & Station: normal Patient leans: N/A  Psychiatric Specialty Exam: ROS  Blood pressure 122/78, pulse 73, height  (1.88 m), weight 230 lb (104.3 kg).Body mass index is 29.53 kg/m.  General Appearance: Casual  Eye Contact:  Good  Speech:  Clear and Coherent  Volume:  Normal  Mood:  Euthymic  Affect:  Congruent  Thought Process:  Goal Directed  Orientation:  Full (Time, Place, and Person)   Thought Content: WDL and Logical   Suicidal Thoughts:  No  Homicidal Thoughts:  No  Memory:  Immediate;   Good Recent;   Good Remote;   Good  Judgement:  Good  Insight:  Good  Psychomotor Activity:  Normal  Concentration:  Concentration: Good and Attention Span: Good  Recall:  Good  Fund of Knowledge: Good  Language: Good  Akathisia:  No  Handed:  Right  AIMS (if indicated):  0  Assets:  Communication Skills Desire for Improvement Housing Physical Health Resilience Social Support Talents/Skills  ADL's:  Intact  Cognition: WNL  Cleared    Assessment: Major depressive disorder, recurrent.  Plan: Patient is stable on Lexapro 20 mg daily.  He has no tremors shakes or any EPS.  He is seeing Jerry Thornton for CBT.  Recommended to continue Lexapro at present dose.  Discuss safety plan that anytime having active suicidal thoughts or homicidal thought that he need to call 911 or go to the local emergency room.  Follow-up in 3 months.    Jerry Thornton T., MD 01/25/2017, 3:58 PM

## 2017-01-27 ENCOUNTER — Encounter (HOSPITAL_COMMUNITY): Payer: Self-pay | Admitting: Clinical

## 2017-01-27 ENCOUNTER — Telehealth (HOSPITAL_COMMUNITY): Payer: Self-pay | Admitting: Clinical

## 2017-01-27 ENCOUNTER — Ambulatory Visit (INDEPENDENT_AMBULATORY_CARE_PROVIDER_SITE_OTHER): Payer: BLUE CROSS/BLUE SHIELD | Admitting: Clinical

## 2017-01-27 ENCOUNTER — Ambulatory Visit (HOSPITAL_COMMUNITY): Payer: Self-pay | Admitting: Psychiatry

## 2017-01-27 DIAGNOSIS — F331 Major depressive disorder, recurrent, moderate: Secondary | ICD-10-CM

## 2017-01-27 NOTE — Progress Notes (Signed)
   THERAPIST PROGRESS NOTE  Session Time: 10:05 - 11:00   Participation Level: Active  Behavioral Response: CasualAlertNA  Type of Therapy: Individual Therapy  Treatment Goals addressed: improve psychiatric symptoms, elevate mood, improve unhelpful thought patterns, interpersonal relationship skills,healthy coping skills  Interventions: motivational interviewing, CBT,   Summary: Jerral Mccauley is a 51 y.o. male who presents with moderate recurrent major depressive disorder   Suicidal/Homicidal: No without intent/plan  Therapist Response: Wayburn met with clinician for an individual session. He discussed his psychiatric symptoms, his current life events and his homework. Massimo shared that while he is still experiencing  Depression he feels he is improving.  Clinician asked open ended questions and Jerri shared about his improvements as well as areas that are challenging to him. He shared that he complete his homework on the challenging areas. Client and clinician reviewed and discussed his unhelpful thoughts. Lancelot shared the unhelpful thoughts, the evidence for and against. Clinician asked clarifying questions. Earnie expanded on his answers. Yeiden shared his thoughts and insights from the process. Mattison was able to identify some unhelpful core beliefs. Abdulkadir agreed to examine the core beliefs he identified for homework as well as to continue practice his grounding and mindfulness techniques.  Larin shared that it made him feel better to be able to recognize and change his unhelpful thoughts, Client and clinician discussed how challenging his thoughts are affecting his relationships. Client and clinician discussed interpersonal relationship skills.   Plan: Return in 1 - 2 weeks.   Diagnosis: Axis I: moderate recurrent major depressive disorder    Gerod Caligiuri A, LCSW 01/27/2017

## 2017-01-28 ENCOUNTER — Ambulatory Visit (HOSPITAL_COMMUNITY): Payer: Self-pay | Admitting: Psychiatry

## 2017-02-01 ENCOUNTER — Ambulatory Visit (INDEPENDENT_AMBULATORY_CARE_PROVIDER_SITE_OTHER): Payer: BLUE CROSS/BLUE SHIELD | Admitting: Clinical

## 2017-02-01 DIAGNOSIS — H00012 Hordeolum externum right lower eyelid: Secondary | ICD-10-CM | POA: Diagnosis not present

## 2017-02-01 DIAGNOSIS — F331 Major depressive disorder, recurrent, moderate: Secondary | ICD-10-CM | POA: Diagnosis not present

## 2017-02-01 NOTE — Progress Notes (Signed)
   THERAPIST PROGRESS NOTE  Session Time: 9:00 -9:28  Participation Level: Active  Behavioral Response: NAAlertNA  Type of Therapy: Individual Therapy  Treatment Goals addressed: improve psychiatric symptoms, elevate mood \ improve unhelpful thought patterns, , healthy coping skills  Interventions: motivational interviewing, CBT,   Summary: Jerry Thornton is a 51 y.o. male who presents with major depressive disorder, recurrent, moderate.   Suicidal/Homicidal: No without intent/plan  Therapist Response: Jerry Thornton met with clinician for an individual session via the phone. He discussed his psychiatric symptoms, his current life events and his homework. Jerry Thornton is [place] of [place] his [place] farm. He shared that he has experienced some good days and some depression. He shared he has continued to complete his homework. Client and clinician discussed his thoughts and insights from his homework. Clinician asked open ended questions and Jerry Thornton identified some of his unhelpful core beliefs. Client and clinician discussed how to challenge them. Jerry Thornton agreed to challenge these beliefs for homework.  Plan: Return in 1 - 2 weeks.  Diagnosis: Axis I: major depressive disorder, recurrent, moderate    Jerry Thornton A, LCSW 02/01/2017

## 2017-02-02 ENCOUNTER — Ambulatory Visit (HOSPITAL_COMMUNITY): Payer: Self-pay | Admitting: Psychiatry

## 2017-02-03 ENCOUNTER — Encounter (HOSPITAL_COMMUNITY): Payer: Self-pay | Admitting: Clinical

## 2017-02-04 NOTE — Telephone Encounter (Signed)
This note is a mistake

## 2017-02-08 ENCOUNTER — Ambulatory Visit (INDEPENDENT_AMBULATORY_CARE_PROVIDER_SITE_OTHER): Payer: BLUE CROSS/BLUE SHIELD | Admitting: Clinical

## 2017-02-08 DIAGNOSIS — F331 Major depressive disorder, recurrent, moderate: Secondary | ICD-10-CM | POA: Diagnosis not present

## 2017-02-09 NOTE — Progress Notes (Signed)
   THERAPIST PROGRESS NOTE  Session Time: 9:05 - 10:00  Participation Level: Active  Behavioral Response: CasualAlertNA  Treatment Goals addressed: improve psychiatric symptoms, elevate mood, improve unhelpful thought patterns,healthy coping skills  Interventions: motivational interviewing, CBT, psychoeducation  Summary: Jerry Thornton is a 51 y.o. male who presents with major depressive disorder, recurrent, moderate  Suicidal/Homicidal: No without intent/plan  Therapist Response: [name] met with clinician for an individual session via the phone. He discussed his psychiatric symptoms, his current life events and his homework. 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 He shared that he has has been trying to do the thought record sheets in his head. Client and clinician discussed his process . Client and clinician walked through the steps of  his negative thoughts, He identified the evidence for and against the thoughts. Clinician asked clarifying questions and [name] expanded on his answers. He was then able to formulate healthier alternative thoughts. Client and clinician discussed the process. Clinician encouraged him to use his worksheets so that he can more effectively challenge his negative thoughts. Client and clinician also discussed unhelpful thought patterns. [name] shared his insights about his unhelpful thought patterns. He agreed to continue to challenge his negative thoughts. Client and clinician discussed when appropriate to use grounding and mindfulness techniques.   Plan: Return in 1 - 2 weeks.  Diagnosis: Axis I: major depressive disorder, recurrent, moderate    Tayja Manzer A, LCSW 02/09/2017

## 2017-02-11 ENCOUNTER — Encounter (HOSPITAL_COMMUNITY): Payer: Self-pay | Admitting: Clinical

## 2017-02-15 ENCOUNTER — Ambulatory Visit (INDEPENDENT_AMBULATORY_CARE_PROVIDER_SITE_OTHER): Payer: BLUE CROSS/BLUE SHIELD | Admitting: Clinical

## 2017-02-15 ENCOUNTER — Telehealth (HOSPITAL_COMMUNITY): Payer: Self-pay | Admitting: Clinical

## 2017-02-15 DIAGNOSIS — F331 Major depressive disorder, recurrent, moderate: Secondary | ICD-10-CM | POA: Diagnosis not present

## 2017-02-15 NOTE — Progress Notes (Signed)
   THERAPIST PROGRESS NOTE  Session Time: 9:02 -9:58  Participation Level: Active  Behavioral Response: NAAlertNA  Treatment Goals addressed: improve psychiatric symptoms, elevate mood, improve unhelpful thought patterns, interpersonal relationship skills, healthy coping skills  Interventions: motivational interviewing, CBT, psychoeducation, he makes very take Jerry Thornton features and asked lots of questions for I guess it and what had.o. male who presents with major depressive disorder, recurrent, moderate  Suicidal/Homicidal: No without intent/plan  Therapist Response: Jerry Thornton met with clinician for an individual session via the phone. He discussed his psychiatric symptoms and his current life events. Jerry Thornton shared that he was feeling pretty good since last session. 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 He shared her thoughts and reflections after last session.  He shared his experiencing identifying challenging and changing unhelpful thought. He shared some of the acceptance he come to. He also shared a bout some of his interpersonal relationships. Clinician asked open ended questions and Jerry Thornton identified some of his growth and some challenges he is identified. Client and clinician discussed interpersonal relationship skills and ways to approach his challenges. Jerry Thornton also shared a bout the grief he is been experiencing a bout his brothers death. Clinician asked open ended questions client and clinician discussed the difference between natural and manufactured emotions Jerry Thornton identified which is emotions were natural and which were manufactured client and clinician discussed how to challenge his thoughts that created the manufactured emotions. Jerry Thornton agreed to work on this between now and next session.   Plan: Return in 1 - 2 weeks.  Diagnosis: Axis I: major depressive disorder, recurrent, moderate    Jerry Thornton A, LCSW 02/15/2017

## 2017-02-16 ENCOUNTER — Encounter (HOSPITAL_COMMUNITY): Payer: Self-pay | Admitting: Clinical

## 2017-02-18 ENCOUNTER — Ambulatory Visit (HOSPITAL_COMMUNITY): Payer: Self-pay | Admitting: Psychiatry

## 2017-02-18 NOTE — Telephone Encounter (Signed)
Progress note

## 2017-02-22 ENCOUNTER — Ambulatory Visit (HOSPITAL_COMMUNITY): Payer: Self-pay | Admitting: Clinical

## 2017-03-01 ENCOUNTER — Ambulatory Visit (INDEPENDENT_AMBULATORY_CARE_PROVIDER_SITE_OTHER): Payer: BLUE CROSS/BLUE SHIELD | Admitting: Clinical

## 2017-03-01 DIAGNOSIS — F331 Major depressive disorder, recurrent, moderate: Secondary | ICD-10-CM | POA: Diagnosis not present

## 2017-03-06 NOTE — Progress Notes (Signed)
   THERAPIST PROGRESS NOTE  Session Time: 9:00 -9:30  Participation Level: Active  Behavioral Response: NAAlertNA  Treatment Goals addressed: improve psychiatric symptoms,learn about diagnosis, healthy coping skills  Interventions: motivational interviewing, CBT, psychoeducation, grounding and mindfulness techniques  Summary: Jerry Thornton is Thornton 51 y.o. male who presents with major depressive disorder, recurrent, moderate  Suicidal/Homicidal: No without intent/plan  Therapist Response: Jerry Thornton met with clinician for an individual session via phone. He discussed his psychiatric symptoms, his current life events. Jerry Thornton's co-worker joined the session briefly. The co-worker joined to assess Jerry Thornton's progress and readiness to return to work.  Client and clinician discussed his progress, his challenges and his thoughts and insights about his diagnosis and symptoms and readiness to return to work. Client and clinician agreed he was ready and discussed continued support while he transitions - in the near future. Client and clinician discussed healthy coping skills.   Plan: Return in 1 - 2 weeks.  Diagnosis: Axis I: major depressive disorder, recurrent, moderate   Jerry Duve A, LCSW 03/06/2017

## 2017-03-07 ENCOUNTER — Encounter (HOSPITAL_COMMUNITY): Payer: Self-pay | Admitting: Clinical

## 2017-03-08 ENCOUNTER — Encounter (HOSPITAL_COMMUNITY): Payer: Self-pay | Admitting: Clinical

## 2017-03-08 ENCOUNTER — Ambulatory Visit (INDEPENDENT_AMBULATORY_CARE_PROVIDER_SITE_OTHER): Payer: BLUE CROSS/BLUE SHIELD | Admitting: Clinical

## 2017-03-08 DIAGNOSIS — F331 Major depressive disorder, recurrent, moderate: Secondary | ICD-10-CM | POA: Diagnosis not present

## 2017-03-08 NOTE — Progress Notes (Signed)
   THERAPIST PROGRESS NOTE  Session Time: 9:30 - 10:00  Participation Level: Active  Behavioral Response: CasualAlertNA  Treatment Goals addressed: improve psychiatric symptoms, elevate mood, improve unhelpful thought patterns,  learn about diagnosis , healthy coping skills  Interventions: motivational interviewing, CBT, psychoeducation, grounding and mindfulness techniques  Summary: Juda Toepfer is a 51 y.o. male who presents with major depressive disorder, recurrent, moderate  Suicidal/Homicidal: No without intent/plan  Therapist Response: Sebastion met with clinician for an individual session via the phone. He discussed his psychiatric symptoms and his current life events. Lewi shared that he had been doing well since last session. Clinician asked open ended questions and Chasen experiencing more emotions lately. 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 He shared that while this was a bit scary he did feel better after. Clinician asked open ended questions and Jabe shared about his emotions. He also shared a fear that his symptoms from his crisis would reappear. Client and clinician discussed his diagnosis. Clinician asked open ended questions about the crisis and his experience since. He had the insight that he has increased skills in dealing with life stressors and a better awareness of his symptoms so that if symptoms did appear he would better know what to do. Client and clinician agreed to meet in 2 weeks rather than one as Aryon continues to improve.   Plan: Return in 1 - 3 weeks.  Diagnosis: Axis I: major depressive disorder, recurrent, moderate    Lagena Strand A, LCSW 03/08/2017

## 2017-03-15 ENCOUNTER — Ambulatory Visit (HOSPITAL_COMMUNITY): Payer: Self-pay | Admitting: Clinical

## 2017-03-24 DIAGNOSIS — Z23 Encounter for immunization: Secondary | ICD-10-CM | POA: Diagnosis not present

## 2017-03-24 DIAGNOSIS — S91331A Puncture wound without foreign body, right foot, initial encounter: Secondary | ICD-10-CM | POA: Diagnosis not present

## 2017-04-06 ENCOUNTER — Encounter (HOSPITAL_COMMUNITY): Payer: Self-pay | Admitting: Clinical

## 2017-04-06 ENCOUNTER — Ambulatory Visit (INDEPENDENT_AMBULATORY_CARE_PROVIDER_SITE_OTHER): Payer: BLUE CROSS/BLUE SHIELD | Admitting: Clinical

## 2017-04-06 DIAGNOSIS — F331 Major depressive disorder, recurrent, moderate: Secondary | ICD-10-CM | POA: Diagnosis not present

## 2017-04-06 NOTE — Progress Notes (Signed)
   THERAPIST PROGRESS NOTE  Session Time: 1:30  - 2:00  Participation Level: Active  Behavioral Response: CasualAlertNA  Treatment Goals addressed: improve psychiatric symptoms, elevate mood,  healthy coping skills  Interventions: motivational interviewing,   Summary: Franchot Pollitt is a 51 y.o. male who presents with major depressive disorder, recurrent, moderate  Suicidal/Homicidal: No without intent/plan  Therapist Response: Willliam met with clinician for an individual session on the phone. He discussed his psychiatric symptoms and his current life events. Saman shared that his symptoms continue to improve. Clinician asked open ended questions and He shared that he has been using the skills learned in therapy and has increased success. He shared that he will be returning to work soon and that while he's ready he is concerned  about maintaining his skills. Clinician asked open ended questions and [name] shared his concerns. Clinician asked open ended questions and [name] identified ways he could continue his skills as well as continue grow. Clinician suggested he journal these ideas. Clinician informed [name] that she would be leaving [employer]. She asked open ended questions and [name] shared his thoughts and emotions. Client and clinician discussed his options for continuing therapy with another clinician.  Plan: Return in 1 - 2 weeks. Diagnosis: Axis I: major depressive disorder, recurrent, moderate    Abran Gavigan A, LCSW 04/06/2017

## 2017-04-21 ENCOUNTER — Encounter (HOSPITAL_COMMUNITY): Payer: Self-pay | Admitting: Clinical

## 2017-04-21 ENCOUNTER — Ambulatory Visit (INDEPENDENT_AMBULATORY_CARE_PROVIDER_SITE_OTHER): Payer: BLUE CROSS/BLUE SHIELD | Admitting: Clinical

## 2017-04-21 DIAGNOSIS — F331 Major depressive disorder, recurrent, moderate: Secondary | ICD-10-CM

## 2017-04-21 NOTE — Progress Notes (Signed)
   THERAPIST PROGRESS NOTE  Session Time: 2:04 - 2:35  Participation Level: Active  Behavioral Response: Neat and Well GroomedAlertNA  Treatment Goals addressed: improve psychiatric symptoms, improve unhelpful thought patterns, emotional regulation skills, healthy coping skills  Interventions: motivational interviewing, CBT,   mindfulness techniques  Summary: Jerry Thornton is a 51 y.o. male who presents with major depressive disorder, recurrent, moderate  Suicidal/Homicidal: No without intent/plan  Therapist Response: Jerry Thornton met with clinician for an individual session. He discussed his psychiatric symptoms and his current life events. Jerry Thornton shared that he returned to work and was feeling fairly good this week. Clinician asked open ended questions and Jerry Thornton shared his thoughts and emotions about his journey and returning to work. Clinician asked open ended questions and Jerry Thornton shared about the skills he has been using that he learned in therapy. Client and clinician discussed how to challenge and change his negative thoughts (cbt). Client and clinician discussed mindfulness.  Client and clinician discussed clinicians upcoming departure from [facility]. Clinician asked open ended questions and Jerry Thornton shared his thoughts and emotions. Client and clinician discussed his options for working with a new clinician    Plan: Return in 1 - 2 weeks.   Diagnosis: Axis I: major depressive disorder, recurrent, moderate    Zerina Hallinan A, LCSW 04/21/2017

## 2017-04-28 ENCOUNTER — Encounter (HOSPITAL_COMMUNITY): Payer: Self-pay | Admitting: Psychiatry

## 2017-04-28 ENCOUNTER — Ambulatory Visit (INDEPENDENT_AMBULATORY_CARE_PROVIDER_SITE_OTHER): Payer: BLUE CROSS/BLUE SHIELD | Admitting: Psychiatry

## 2017-04-28 DIAGNOSIS — Z818 Family history of other mental and behavioral disorders: Secondary | ICD-10-CM

## 2017-04-28 DIAGNOSIS — F323 Major depressive disorder, single episode, severe with psychotic features: Secondary | ICD-10-CM

## 2017-04-28 MED ORDER — ESCITALOPRAM OXALATE 20 MG PO TABS
20.0000 mg | ORAL_TABLET | Freq: Every day | ORAL | 0 refills | Status: DC
Start: 2017-04-28 — End: 2017-08-17

## 2017-04-28 NOTE — Progress Notes (Signed)
BH MD/PA/NP OP Progress Note  04/28/2017 4:03 PM Jerry Thornton  MRN:  [id number]  Chief Complaint:   Subjective:  I am doing good.  HPI: Patient came for his follow-up appointment.  He is taking Lexapro 20 mg daily.  Recently he had a vacation to [place] with her family and he had a good time.  He denies any irritability, anger, mania, psychosis or any hallucination.  He resume his work few months ago.  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  Patient denies any crying spells, hopelessness or any worthlessness.  He denies any suicidal thoughts.  His energy level is good.  His appetite is okay.  Patient denies drinking alcohol or using any illegal substances.  He has no issues with Lexapro.  He denies any tremors, shakes or any EPS.  His vitals signs are stable.  Patient is seeing Tomma Lightning for counseling.  Visit Diagnosis:    ICD-10-CM   1. 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 Patient has history of psychiatric inpatient treatment in December 2017 due to psychotic episode.  He was also admitted in 1999 due to psychosis and depression.  He was prescribed Wellbutrin, Depakote but he stopped the medication due to side effects.  He was prescribed Abilify, trazodone and Ativan on his last admission but he stopped due to weight gain and felt he does not needed.  Patient denies any history of suicidal attempt.  Past Medical History:  Past Medical History:  Diagnosis Date  . Allergy   . Anxiety   . Depression    No past surgical history on file.  Family Psychiatric History: Reviewed.  Family History:  Family History  Problem Relation Age of Onset  . Mental illness Father   . Heart disease Paternal Grandfather   . Mental illness Brother   . Suicidality Brother   . Depression Brother     Social History:  Social  History   Social History  . Marital status: Married    Spouse name: N/A  . Number of children: N/A  . Years of education: N/A   Social History Main Topics  . Smoking status: Never Smoker  . Smokeless tobacco: Never Used  . Alcohol use No     Comment: Less than 1 drink a week.   . Drug use: No  . Sexual activity: Yes    Partners: Female    Birth control/ protection: None   Other Topics Concern  . Not on file   Social History Narrative  . No narrative on file    Allergies: No Known Allergies  Metabolic Disorder Labs: Lab Results  Component Value Date   HGBA1C 5.0 10/05/2016   MPG 97 10/05/2016   Lab Results  Component Value Date   PROLACTIN 10.8 10/05/2016   Lab Results  Component Value Date   CHOL 190 10/05/2016   TRIG 108 10/05/2016   HDL 59 10/05/2016   CHOLHDL 3.2 10/05/2016   VLDL 22 10/05/2016   LDLCALC 109 (H) 10/05/2016     Current Medications: Current Outpatient Prescriptions  Medication Sig Dispense Refill  . escitalopram (LEXAPRO) 20 MG tablet Take 1 tablet (20 mg total) by mouth daily. 90 tablet 0  . Omega-3 Fatty Acids (FISH OIL PO) Take 1 capsule by mouth daily. Reported on 04/26/2016     No current facility-administered medications for this visit.  Neurologic: Headache: No Seizure: No Paresthesias: No  Musculoskeletal: Strength & Muscle Tone: within normal limits Gait & Station: normal Patient leans: N/A  Psychiatric Specialty Exam: ROS  Blood pressure 122/68, pulse 63, height 6\' 2"  (1.88 m), weight 221 lb (100.2 kg).There is no height or weight on file to calculate BMI.  General Appearance: Casual  Eye Contact:  Good  Speech:  Clear and Coherent  Volume:  Normal  Mood:  Euthymic  Affect:  Congruent  Thought Process:  Goal Directed  Orientation:  Full (Time, Place, and Person)  Thought Content: Logical   Suicidal Thoughts:  No  Homicidal Thoughts:  No  Memory:  Immediate;   Good Recent;   Good Remote;   Good  Judgement:   Good  Insight:  Good  Psychomotor Activity:  Normal  Concentration:  Concentration: Good and Attention Span: Good  Recall:  Good  Fund of Knowledge: Good  Language: Good  Akathisia:  No  Handed:  Right  AIMS (if indicated):  0  Assets:  Communication Skills Desire for Improvement Housing Physical Health Resilience Social Support Talents/Skills  ADL's:  Intact  Cognition: WNL  Sleep:  ok    Assessment: Major depressive disorder, recurrent.  Plan: Patient is doing better on Lexapro 20 mg.  He does not have any side effects.  I will continue Lexapro 20 mg daily.  He is seeing Tomma LightningFrankie for counseling.  Recommended to call us back if he is any question, concern or if he feel worsening of the symptom.  Follow-up in 3 months.  Doraine Schexnider T., MD 04/28/2017, 4:03 PM

## 2017-05-05 ENCOUNTER — Ambulatory Visit (HOSPITAL_COMMUNITY): Payer: Self-pay | Admitting: Clinical

## 2017-07-15 DIAGNOSIS — Z23 Encounter for immunization: Secondary | ICD-10-CM | POA: Diagnosis not present

## 2017-08-17 ENCOUNTER — Ambulatory Visit (INDEPENDENT_AMBULATORY_CARE_PROVIDER_SITE_OTHER): Payer: BLUE CROSS/BLUE SHIELD | Admitting: Psychiatry

## 2017-08-17 ENCOUNTER — Encounter (HOSPITAL_COMMUNITY): Payer: Self-pay | Admitting: Psychiatry

## 2017-08-17 DIAGNOSIS — F323 Major depressive disorder, single episode, severe with psychotic features: Secondary | ICD-10-CM | POA: Diagnosis not present

## 2017-08-17 DIAGNOSIS — Z79899 Other long term (current) drug therapy: Secondary | ICD-10-CM | POA: Diagnosis not present

## 2017-08-17 DIAGNOSIS — Z818 Family history of other mental and behavioral disorders: Secondary | ICD-10-CM

## 2017-08-17 MED ORDER — ESCITALOPRAM OXALATE 20 MG PO TABS: 20.0000 mg | ORAL_TABLET | Freq: Every day | ORAL | 1 refills | Status: DC

## 2017-08-17 NOTE — Progress Notes (Signed)
BH MD/PA/NP OP Progress Note  08/17/2017 2:11 PM Jerry Thornton  MRN:  [id number]  Chief Complaint:  Chief Complaint    Follow-up     HPI: Patient came for his follow-up appointment.  He is taking his Lexapro 20 mg daily.  He takes at bedtime because it makes him sleepy.  He denies any irritability, anger, mania, psychosis or any hallucination.  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  He denies any feeling of hopelessness or any worthlessness.  His energy level is good.  He denies any tremors, shakes or any EPS.  He wants to continue his medication since it is helping him.  Denies any tremors, shakes or any EPS.  He admitted weight gain but today he had a boots on and he feels that may be contributing to falls weight gain.  He is not seeing therapist because they feel he is doing very well.  Visit Diagnosis:    ICD-10-CM   1. 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 Patient has history of psychiatric inpatient treatment in December 2017 due to psychotic episode.  He was also admitted in 1999 due to psychosis and depression.  He was prescribed Wellbutrin, Depakote but he stopped the medication due to side effects.  He was prescribed Abilify, trazodone and Ativan on his last admission but he stopped due to weight gain and felt he does not needed.  Patient denies any history of suicidal attempt.  Past Medical History:  Past Medical History:  Diagnosis Date  . Allergy   . Anxiety   . Depression    History reviewed. No pertinent surgical history.  Family Psychiatric History: Reviewed.  Family History:  Family History  Problem Relation Age of Onset  . Mental illness Father   . Heart disease Paternal Grandfather   . Mental illness Brother   . Suicidality Brother   . Depression Brother     Social History:  Social  History   Social History  . Marital status: Married    Spouse name: N/A  . Number of children: N/A  . Years of education: N/A   Social History Main Topics  . Smoking status: Never Smoker  . Smokeless tobacco: Never Used  . Alcohol use No     Comment: Less than 1 drink a week.   . Drug use: No  . Sexual activity: Yes    Partners: Female    Birth control/ protection: None   Other Topics Concern  . None   Social History Narrative  . None    Allergies: No Known Allergies  Metabolic Disorder Labs: Lab Results  Component Value Date   HGBA1C 5.0 10/05/2016   MPG 97 10/05/2016   Lab Results  Component Value Date   PROLACTIN 10.8 10/05/2016   Lab Results  Component Value Date   CHOL 190 10/05/2016   TRIG 108 10/05/2016   HDL 59 10/05/2016   CHOLHDL 3.2 10/05/2016   VLDL 22 10/05/2016   LDLCALC 109 (H) 10/05/2016   Lab Results  Component Value Date   TSH 0.758 10/05/2016    Therapeutic Level Labs: No results found for: LITHIUM No results found for: VALPROATE No components found for:  CBMZ  Current Medications: Current Outpatient Prescriptions  Medication Sig Dispense Refill  . escitalopram (LEXAPRO) 20 MG tablet Take 1 tablet (20 mg total) by mouth daily. 90  tablet 0  . Omega-3 Fatty Acids (FISH OIL PO) Take 1 capsule by mouth daily. Reported on 04/26/2016     No current facility-administered medications for this visit.      Musculoskeletal: Strength & Muscle Tone: within normal limits Gait & Station: normal Patient leans: N/A  Psychiatric Specialty Exam: ROS  Blood pressure 140/82, pulse 63, height 6\' 2"  (1.88 m), weight 241 lb 9.6 oz (109.6 kg).There is no height or weight on file to calculate BMI.  General Appearance: Casual  Eye Contact:  Good  Speech:  Clear and Coherent  Volume:  Normal  Mood:  Euthymic  Affect:  Congruent  Thought Process:  Coherent  Orientation:  Full (Time, Place, and Person)  Thought Content: Logical   Suicidal  Thoughts:  No  Homicidal Thoughts:  No  Memory:  Immediate;   Good Recent;   Good Remote;   Good  Judgement:  Good  Insight:  Good  Psychomotor Activity:  Normal  Concentration:  Concentration: Good and Attention Span: Good  Recall:  Good  Fund of Knowledge: Good  Language: Good  Akathisia:  No  Handed:  Right  AIMS (if indicated): not done  Assets:  Communication Skills Desire for Improvement Housing Resilience Social Support Talents/Skills  ADL's:  Intact  Cognition: WNL  Sleep:  Good   Screenings: AUDIT     [facility]  Alcohol Use Disorder Identification Test Final Score (AUDIT)  0    PHQ2-9     Counselor from 11/15/2016 in [facility] [place] [facility]  PHQ-2 Total Score  6  0  0  PHQ-9 Total Score  25  -  -       Assessment and Plan: Major depressive disorder, recurrent.  Patient is doing better on Lexapro 20 mg.  He has no issues or side effects.  Discussed medication side effects and benefits.  He is not interested in counseling since he is doing better.  However he like to continue Lexapro 20 mg daily.  Recommended to call us back if his any question or any concern.  Follow-up in 6 months.   Timmy Cleverly T., MD 08/17/2017, 2:11 PM

## 2017-10-25 DIAGNOSIS — M542 Cervicalgia: Secondary | ICD-10-CM | POA: Diagnosis not present

## 2017-10-25 DIAGNOSIS — M545 Low back pain: Secondary | ICD-10-CM | POA: Diagnosis not present

## 2017-10-31 DIAGNOSIS — M542 Cervicalgia: Secondary | ICD-10-CM | POA: Diagnosis not present

## 2017-10-31 DIAGNOSIS — M545 Low back pain: Secondary | ICD-10-CM | POA: Diagnosis not present

## 2017-11-07 DIAGNOSIS — M545 Low back pain: Secondary | ICD-10-CM | POA: Diagnosis not present

## 2017-11-07 DIAGNOSIS — M542 Cervicalgia: Secondary | ICD-10-CM | POA: Diagnosis not present

## 2018-01-06 ENCOUNTER — Encounter: Payer: Self-pay | Admitting: Emergency Medicine

## 2018-01-06 ENCOUNTER — Ambulatory Visit (INDEPENDENT_AMBULATORY_CARE_PROVIDER_SITE_OTHER): Payer: BLUE CROSS/BLUE SHIELD | Admitting: Emergency Medicine

## 2018-01-06 VITALS — BP 119/78 | HR 85 | Temp 98.6°F | Resp 17 | Ht 75.5 in | Wt 243.0 lb

## 2018-01-06 DIAGNOSIS — J029 Acute pharyngitis, unspecified: Secondary | ICD-10-CM

## 2018-01-06 DIAGNOSIS — R5383 Other fatigue: Secondary | ICD-10-CM | POA: Diagnosis not present

## 2018-01-06 DIAGNOSIS — B349 Viral infection, unspecified: Secondary | ICD-10-CM | POA: Diagnosis not present

## 2018-01-06 NOTE — Progress Notes (Signed)
Jerry Thornton 52 y.o.   Chief Complaint  Patient presents with  . Fatigue    HISTORY OF PRESENT ILLNESS: This is a 52 y.o. male complaining of general malaise and fatigue with a bad sore throat, and feeling wiped out that started 3 days ago and is now much improved.  No other significant symptoms.  Denies fever or chills.  Eating and drinking well.  Denies nausea or vomiting.  No significant exposure.  Most likely a viral illness.  HPI   Prior to Admission medications   Medication Sig Start Date End Date Taking? Authorizing Provider  escitalopram (LEXAPRO) 20 MG tablet Take 1 tablet (20 mg total) by mouth daily. 08/17/17  Yes Arfeen, Phillips Grout, MD  Omega-3 Fatty Acids (FISH OIL PO) Take 1 capsule by mouth daily. Reported on 04/26/2016   Yes [provider]    No Known Allergies  Patient Active Problem List   Diagnosis Date Noted  . Major depressive disorder, recurrent episode, moderate (HCC) 01/23/2017  . Generalized anxiety disorder 10/03/2016  . MDD (major depressive disorder), single episode, severe with psychotic features (HCC) 10/03/2016    Past Medical History:  Diagnosis Date  . Allergy   . Anxiety   . Depression     No past surgical history on file.  Social History   Socioeconomic History  . Marital status: Married    Spouse name: Not on file  . Number of children: Not on file  . Years of education: Not on file  . Highest education level: Not on file  Occupational History  . Not on file  Social Needs  . Financial resource strain: Not on file  . Food insecurity:    Worry: Not on file    Inability: Not on file  . Transportation needs:    Medical: Not on file    Non-medical: Not on file  Tobacco Use  . Smoking status: Never Smoker  . Smokeless tobacco: Never Used  Substance and Sexual Activity  . Alcohol use: No    Comment: Less than 1 drink a week.   . Drug use: No  . Sexual activity: Yes    Partners: Female    Birth control/protection:  None  Lifestyle  . Physical activity:    Days per week: Not on file    Minutes per session: Not on file  . Stress: Not on file  Relationships  . Social connections:    Talks on phone: Not on file    Gets together: Not on file    Attends religious service: Not on file    Active member of club or organization: Not on file    Attends meetings of clubs or organizations: Not on file    Relationship status: Not on file  . Intimate partner violence:    Fear of current or ex partner: Not on file    Emotionally abused: Not on file    Physically abused: Not on file    Forced sexual activity: Not on file  Other Topics Concern  . Not on file  Social History Narrative  . Not on file    Family History  Problem Relation Age of Onset  . Mental illness Father   . Heart disease Paternal Grandfather   . Mental illness Brother   . Suicidality Brother   . Depression Brother      Review of Systems  Constitutional: Positive for malaise/fatigue. Negative for chills.  HENT: Positive for sore throat. Negative for congestion and nosebleeds.  Eyes: Negative.  Negative for discharge and redness.  Respiratory: Negative.  Negative for cough and shortness of breath.   Cardiovascular: Negative for chest pain and palpitations.  Gastrointestinal: Negative.  Negative for abdominal pain, diarrhea, nausea and vomiting.  Genitourinary: Negative.  Negative for dysuria and hematuria.  Musculoskeletal: Positive for myalgias.  Skin: Negative for rash.  Neurological: Negative for dizziness and headaches.  Endo/Heme/Allergies: Negative.   All other systems reviewed and are negative.   Vitals:   01/06/18 1137  BP: 119/78  Pulse: 85  Resp: 17  Temp: 98.6 F (37 C)  SpO2: 98%    Physical Exam  Constitutional: He is oriented to person, place, and time. He appears well-developed and well-nourished.  HENT:  Head: Normocephalic and atraumatic.  Right Ear: Tympanic membrane, external ear and ear canal  normal.  Left Ear: Tympanic membrane, external ear and ear canal normal.  Mouth/Throat: Posterior oropharyngeal erythema present. No oropharyngeal exudate, posterior oropharyngeal edema or tonsillar abscesses.  Eyes: Pupils are equal, round, and reactive to light. Conjunctivae and EOM are normal.  Neck: Normal range of motion. Neck supple. No thyromegaly present.  Cardiovascular: Normal rate, regular rhythm and normal heart sounds.  Pulmonary/Chest: Effort normal and breath sounds normal.  Abdominal: Soft. Bowel sounds are normal. He exhibits no distension. There is no tenderness.  Musculoskeletal: Normal range of motion.  Lymphadenopathy:    He has no cervical adenopathy.  Neurological: He is alert and oriented to person, place, and time. No sensory deficit. He exhibits normal muscle tone.  Skin: Skin is warm and dry. Capillary refill takes less than 2 seconds. No rash noted.  Psychiatric: He has a normal mood and affect. His behavior is normal.  Vitals reviewed.    ASSESSMENT & PLAN: [name] was seen today for fatigue.  Diagnoses and all orders for this visit:  Viral illness  Fatigue, unspecified type  Sore throat    Patient Instructions       IF you received an x-ray today, you will receive an invoice from [facility]. Please contact [facility] at [phone] with questions or concerns regarding your invoice.   IF you received labwork today, you will receive an invoice from [facility]. Please contact [facility] at [phone] with questions or concerns regarding your invoice.   Our billing staff will not be able to assist you with questions regarding bills from these companies.  You will be contacted with the lab results as soon as they are available. The fastest way to get your results is to activate your My Chart account. Instructions are located on the last page of this paperwork. If you have not heard from us regarding the results in 2 weeks,  please contact this office.      Viral Illness, Adult Viruses are tiny germs that can get into a person's body and cause illness. There are many different types of viruses, and they cause many types of illness. Viral illnesses can range from mild to severe. They can affect various parts of the body. Common illnesses that are caused by a virus include colds and the flu. Viral illnesses also include serious conditions such as HIV/AIDS (human immunodeficiency virus/acquired immunodeficiency syndrome). A few viruses have been linked to certain cancers. What are the causes? Many types of viruses can cause illness. Viruses invade cells in your body, multiply, and cause the infected cells to malfunction or die. When the cell dies, it releases more of the virus. When this happens, you develop symptoms of the illness,  and the virus continues to spread to other cells. If the virus takes over the function of the cell, it can cause the cell to divide and grow out of control, as is the case when a virus causes cancer. Different viruses get into the body in different ways. You can get a virus by:  Swallowing food or water that is contaminated with the virus.  Breathing in droplets that have been coughed or sneezed into the air by an infected person.  Touching a surface that has been contaminated with the virus and then touching your eyes, nose, or mouth.  Being bitten by an insect or animal that carries the virus.  Having sexual contact with a person who is infected with the virus.  Being exposed to blood or fluids that contain the virus, either through an open cut or during a transfusion.  If a virus enters your body, your body's defense system (immune system) will try to fight the virus. You may be at higher risk for a viral illness if your immune system is weak. What are the signs or symptoms? Symptoms vary depending on the type of virus and the location of the cells that it invades. Common symptoms  of the main types of viral illnesses include: Cold and flu viruses  Fever.  Headache.  Sore throat.  Muscle aches.  Nasal congestion.  Cough. Digestive system (gastrointestinal) viruses  Fever.  Abdominal pain.  Nausea.  Diarrhea. Liver viruses (hepatitis)  Loss of appetite.  Tiredness.  Yellowing of the skin (jaundice). Brain and spinal cord viruses  Fever.  Headache.  Stiff neck.  Nausea and vomiting.  Confusion or sleepiness. Skin viruses  Warts.  Itching.  Rash. Sexually transmitted viruses  Discharge.  Swelling.  Redness.  Rash. How is this treated? Viruses can be difficult to treat because they live within cells. Antibiotic medicines do not treat viruses because these drugs do not get inside cells. Treatment for a viral illness may include:  Resting and drinking plenty of fluids.  Medicines to relieve symptoms. These can include over-the-counter medicine for pain and fever, medicines for cough or congestion, and medicines to relieve diarrhea.  Antiviral medicines. These drugs are available only for certain types of viruses. They may help reduce flu symptoms if taken early. There are also many antiviral medicines for hepatitis and HIV/AIDS.  Some viral illnesses can be prevented with vaccinations. A common example is the flu shot. Follow these instructions at home: Medicines   Take over-the-counter and prescription medicines only as told by your health care provider.  If you were prescribed an antiviral medicine, take it as told by your health care provider. Do not stop taking the medicine even if you start to feel better.  Be aware of when antibiotics are needed and when they are not needed. Antibiotics do not treat viruses. If your health care provider thinks that you may have a bacterial infection as well as a viral infection, you may get an antibiotic. ? Do not ask for an antibiotic prescription if you have been diagnosed with a  viral illness. That will not make your illness go away faster. ? Frequently taking antibiotics when they are not needed can lead to antibiotic resistance. When this develops, the medicine no longer works against the bacteria that it normally fights. General instructions  Drink enough fluids to keep your urine clear or pale yellow.  Rest as much as possible.  Return to your normal activities as told by your health care provider.  Ask your health care provider what activities are safe for you.  Keep all follow-up visits as told by your health care provider. This is important. How is this prevented? Take these actions to reduce your risk of viral infection:  Eat a healthy diet and get enough rest.  Wash your hands often with soap and water. This is especially important when you are in public places. If soap and water are not available, use hand sanitizer.  Avoid close contact with friends and family who have a viral illness.  If you travel to areas where viral gastrointestinal infection is common, avoid drinking water or eating raw food.  Keep your immunizations up to date. Get a flu shot every year as told by your health care provider.  Do not share toothbrushes, nail clippers, razors, or needles with other people.  Always practice safe sex.  Contact a health care provider if:  You have symptoms of a viral illness that do not go away.  Your symptoms come back after going away.  Your symptoms get worse. Get help right away if:  You have trouble breathing.  You have a severe headache or a stiff neck.  You have severe vomiting or abdominal pain. This information is not intended to replace advice given to you by your health care provider. Make sure you discuss any questions you have with your health care provider. Document Released: 02/13/2016 Document Revised: 03/17/2016 Document Reviewed: 02/13/2016 Elsevier Interactive Patient Education  2018 [employer]      Edwina Barth, MD [facility]

## 2018-01-06 NOTE — Patient Instructions (Addendum)
   IF you received an x-ray today, you will receive an invoice from [facility]. Please contact  Radiology at [phone] with questions or concerns regarding your invoice.   IF you received labwork today, you will receive an invoice from [facility]. Please contact [facility] at [phone] with questions or concerns regarding your invoice.   Our billing staff will not be able to assist you with questions regarding bills from these companies.  You will be contacted with the lab results as soon as they are available. The fastest way to get your results is to activate your My Chart account. Instructions are located on the last page of this paperwork. If you have not heard from us regarding the results in 2 weeks, please contact this office.     Viral Illness, Adult Viruses are tiny germs that can get into a person's body and cause illness. There are many different types of viruses, and they cause many types of illness. Viral illnesses can range from mild to severe. They can affect various parts of the body. Common illnesses that are caused by a virus include colds and the flu. Viral illnesses also include serious conditions such as HIV/AIDS (human immunodeficiency virus/acquired immunodeficiency syndrome). A few viruses have been linked to certain cancers. What are the causes? Many types of viruses can cause illness. Viruses invade cells in your body, multiply, and cause the infected cells to malfunction or die. When the cell dies, it releases more of the virus. When this happens, you develop symptoms of the illness, and the virus continues to spread to other cells. If the virus takes over the function of the cell, it can cause the cell to divide and grow out of control, as is the case when a virus causes cancer. Different viruses get into the body in different ways. You can get a virus by:  Swallowing food or water that is contaminated with the virus.  Breathing in droplets  that have been coughed or sneezed into the air by an infected person.  Touching a surface that has been contaminated with the virus and then touching your eyes, nose, or mouth.  Being bitten by an insect or animal that carries the virus.  Having sexual contact with a person who is infected with the virus.  Being exposed to blood or fluids that contain the virus, either through an open cut or during a transfusion.  If a virus enters your body, your body's defense system (immune system) will try to fight the virus. You may be at higher risk for a viral illness if your immune system is weak. What are the signs or symptoms? Symptoms vary depending on the type of virus and the location of the cells that it invades. Common symptoms of the main types of viral illnesses include: Cold and flu viruses  Fever.  Headache.  Sore throat.  Muscle aches.  Nasal congestion.  Cough. Digestive system (gastrointestinal) viruses  Fever.  Abdominal pain.  Nausea.  Diarrhea. Liver viruses (hepatitis)  Loss of appetite.  Tiredness.  Yellowing of the skin (jaundice). Brain and spinal cord viruses  Fever.  Headache.  Stiff neck.  Nausea and vomiting.  Confusion or sleepiness. Skin viruses  Warts.  Itching.  Rash. Sexually transmitted viruses  Discharge.  Swelling.  Redness.  Rash. How is this treated? Viruses can be difficult to treat because they live within cells. Antibiotic medicines do not treat viruses because these drugs do not get inside cells. Treatment for a viral illness   may include:  Resting and drinking plenty of fluids.  Medicines to relieve symptoms. These can include over-the-counter medicine for pain and fever, medicines for cough or congestion, and medicines to relieve diarrhea.  Antiviral medicines. These drugs are available only for certain types of viruses. They may help reduce flu symptoms if taken early. There are also many antiviral medicines  for hepatitis and HIV/AIDS.  Some viral illnesses can be prevented with vaccinations. A common example is the flu shot. Follow these instructions at home: Medicines   Take over-the-counter and prescription medicines only as told by your health care provider.  If you were prescribed an antiviral medicine, take it as told by your health care provider. Do not stop taking the medicine even if you start to feel better.  Be aware of when antibiotics are needed and when they are not needed. Antibiotics do not treat viruses. If your health care provider thinks that you may have a bacterial infection as well as a viral infection, you may get an antibiotic. ? Do not ask for an antibiotic prescription if you have been diagnosed with a viral illness. That will not make your illness go away faster. ? Frequently taking antibiotics when they are not needed can lead to antibiotic resistance. When this develops, the medicine no longer works against the bacteria that it normally fights. General instructions  Drink enough fluids to keep your urine clear or pale yellow.  Rest as much as possible.  Return to your normal activities as told by your health care provider. Ask your health care provider what activities are safe for you.  Keep all follow-up visits as told by your health care provider. This is important. How is this prevented? Take these actions to reduce your risk of viral infection:  Eat a healthy diet and get enough rest.  Wash your hands often with soap and water. This is especially important when you are in public places. If soap and water are not available, use hand sanitizer.  Avoid close contact with friends and family who have a viral illness.  If you travel to areas where viral gastrointestinal infection is common, avoid drinking water or eating raw food.  Keep your immunizations up to date. Get a flu shot every year as told by your health care provider.  Do not share toothbrushes,  nail clippers, razors, or needles with other people.  Always practice safe sex.  Contact a health care provider if:  You have symptoms of a viral illness that do not go away.  Your symptoms come back after going away.  Your symptoms get worse. Get help right away if:  You have trouble breathing.  You have a severe headache or a stiff neck.  You have severe vomiting or abdominal pain. This information is not intended to replace advice given to you by your health care provider. Make sure you discuss any questions you have with your health care provider. Document Released: 02/13/2016 Document Revised: 03/17/2016 Document Reviewed: 02/13/2016 Elsevier Interactive Patient Education  2018 [employer]  

## 2018-02-14 ENCOUNTER — Ambulatory Visit (INDEPENDENT_AMBULATORY_CARE_PROVIDER_SITE_OTHER): Payer: BLUE CROSS/BLUE SHIELD | Admitting: Psychiatry

## 2018-02-14 ENCOUNTER — Encounter (HOSPITAL_COMMUNITY): Payer: Self-pay | Admitting: Psychiatry

## 2018-02-14 DIAGNOSIS — F323 Major depressive disorder, single episode, severe with psychotic features: Secondary | ICD-10-CM

## 2018-02-14 DIAGNOSIS — Z818 Family history of other mental and behavioral disorders: Secondary | ICD-10-CM | POA: Diagnosis not present

## 2018-02-14 MED ORDER — ESCITALOPRAM OXALATE 20 MG PO TABS: 20.0000 mg | ORAL_TABLET | Freq: Every day | ORAL | 1 refills | Status: DC

## 2018-02-14 NOTE — Progress Notes (Signed)
BH MD/PA/NP OP Progress Note  02/14/2018 2:15 PM Jerry Thornton  MRN:  [id number]  Chief Complaint: I am doing better on my medication for some time I feel groggy and sleepy.  HPI: Patient came for his follow-up appointment.  He is taking Lexapro 20 mg but sometime he feels very groggy and he tried to take it in the morning and sometimes in the evening.  Overall his anxiety is much better.  He does not feel very overwhelmed when he goes to court.  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  He lives with his wife and 2 teenage boys.  Patient denies any tremors, shakes or any EPS.  He denies any feeling of hopelessness or crying spells.  He denies any suicidal thoughts.  He admitted weight gain because sometime his lifestyle is very sedentary.  He promised that he will start gym and watch his calorie intake.  He has not done physical in a while and I reminded him that he has to do the blood work and physical.  Patient denies illegal substance use.  Visit Diagnosis:    ICD-10-CM   1. 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 Patient has history of psychiatric inpatient treatment in December 2017 due to psychotic episode. He was also admitted in 1999 due to psychosis and depression. He was prescribed Wellbutrin, Depakote but he stopped the medication due to side effects. He was prescribed Abilify, trazodone and Ativan on his last admission but he stopped due to weight gain and felt he does not needed. Patient denies any history of suicidal attempt.  Past Medical History:  Past Medical History:  Diagnosis Date  . Allergy   . Anxiety   . Depression    No past surgical history on file.  Family Psychiatric History: Reviewed  Family History:  Family History  Problem Relation Age of Onset  . Mental illness Father   . Heart  disease Paternal Grandfather   . Mental illness Brother   . Suicidality Brother   . Depression Brother     Social History:  Social History   Socioeconomic History  . Marital status: Married    Spouse name: Not on file  . Number of children: Not on file  . Years of education: Not on file  . Highest education level: Not on file  Occupational History  . Not on file  Social Needs  . Financial resource strain: Not on file  . Food insecurity:    Worry: Not on file    Inability: Not on file  . Transportation needs:    Medical: Not on file    Non-medical: Not on file  Tobacco Use  . Smoking status: Never Smoker  . Smokeless tobacco: Never Used  Substance and Sexual Activity  . Alcohol use: No    Comment: Less than 1 drink a week.   . Drug use: No  . Sexual activity: Yes    Partners: Female    Birth control/protection: None  Lifestyle  . Physical activity:    Days per week: Not on file    Minutes per session: Not on file  . Stress: Not on file  Relationships  . Social connections:    Talks on phone: Not on file    Gets together: Not on file    Attends religious service: Not on file    Active  member of club or organization: Not on file    Attends meetings of clubs or organizations: Not on file    Relationship status: Not on file  Other Topics Concern  . Not on file  Social History Narrative  . Not on file    Allergies: No Known Allergies  Metabolic Disorder Labs: Lab Results  Component Value Date   HGBA1C 5.0 10/05/2016   MPG 97 10/05/2016   Lab Results  Component Value Date   PROLACTIN 10.8 10/05/2016   Lab Results  Component Value Date   CHOL 190 10/05/2016   TRIG 108 10/05/2016   HDL 59 10/05/2016   CHOLHDL 3.2 10/05/2016   VLDL 22 10/05/2016   LDLCALC 109 (H) 10/05/2016   Lab Results  Component Value Date   TSH 0.758 10/05/2016    Therapeutic Level Labs: No results found for: LITHIUM No results found for: VALPROATE No components found for:   CBMZ  Current Medications: Current Outpatient Medications  Medication Sig Dispense Refill  . escitalopram (LEXAPRO) 20 MG tablet Take 1 tablet (20 mg total) by mouth daily. 90 tablet 1  . Omega-3 Fatty Acids (FISH OIL PO) Take 1 capsule by mouth daily. Reported on 04/26/2016     No current facility-administered medications for this visit.      Musculoskeletal: Strength & Muscle Tone: within normal limits Gait & Station: normal Patient leans: N/A  Psychiatric Specialty Exam: ROS  Blood pressure 125/84, pulse 72, height 6' 3.5" (1.918 m), weight 241 lb (109.3 kg), SpO2 93 %.Body mass index is 29.73 kg/m.  General Appearance: Well Groomed  Eye Contact:  Good  Speech:  Clear and Coherent  Volume:  Normal  Mood:  Euthymic  Affect:  Appropriate  Thought Process:  Goal Directed  Orientation:  Full (Time, Place, and Person)  Thought Content: Logical   Suicidal Thoughts:  No  Homicidal Thoughts:  No  Memory:  Immediate;   Good Recent;   Good Remote;   Good  Judgement:  Good  Insight:  Good  Psychomotor Activity:  Normal  Concentration:  Concentration: Good and Attention Span: Good  Recall:  Good  Fund of Knowledge: Good  Language: Good  Akathisia:  No  Handed:  Right  AIMS (if indicated): not done  Assets:  Communication Skills Desire for Improvement Housing Resilience Social Support Talents/Skills Transportation  ADL's:  Intact  Cognition: WNL  Sleep:  Good   Screenings: AUDIT     [facility]  Alcohol Use Disorder Identification Test Final Score (AUDIT)  0    PHQ2-9     Office Visit from 01/06/2018 in [facility] at [facility] from 11/15/2016 in [facility] [place] Office Visit from 06/24/2015 in [facility] Office Visit from 06/09/2015 in [facility]  PHQ-2 Total Score  0  6  0  0  PHQ-9 Total Score  -  25  -  -        Assessment and Plan: Major depressive disorder, recurrent.  Patient is a stable on Lexapro 20 mg daily but some days he has difficulty tolerating and feels dizzy.  He is wondering if he can cut down the dose.  I recommended to try 10 mg however if he started to feel anxious and depressed then he need to go back on 20 mg.  Patient agreed with the plan.  We will provide 20 mg but he will cut down  the dose to 10 mg to see if he does not have grogginess and he remains symptoms free.  He will call us month from now about his experience.  I recommended to call us back if he has any further question.  Reinforced blood work and annual physical checkup.  Reinforced healthy lifestyle.  Follow-up in 6 months.   Cleotis Nipper, MD 02/14/2018, 2:15 PM

## 2018-03-16 DIAGNOSIS — D225 Melanocytic nevi of trunk: Secondary | ICD-10-CM | POA: Diagnosis not present

## 2018-07-13 ENCOUNTER — Encounter (HOSPITAL_COMMUNITY): Payer: Self-pay | Admitting: Psychiatry

## 2018-07-13 ENCOUNTER — Ambulatory Visit (INDEPENDENT_AMBULATORY_CARE_PROVIDER_SITE_OTHER): Payer: BLUE CROSS/BLUE SHIELD | Admitting: Psychiatry

## 2018-07-13 DIAGNOSIS — F323 Major depressive disorder, single episode, severe with psychotic features: Secondary | ICD-10-CM

## 2018-07-13 DIAGNOSIS — F419 Anxiety disorder, unspecified: Secondary | ICD-10-CM

## 2018-07-13 MED ORDER — ESCITALOPRAM OXALATE 10 MG PO TABS: 10.0000 mg | ORAL_TABLET | Freq: Every day | ORAL | 1 refills | Status: DC

## 2018-07-13 NOTE — Progress Notes (Signed)
BH MD/PA/NP OP Progress Note  07/13/2018 3:18 PM Jerry Thornton  MRN:  [id number]  Chief Complaint: I am doing much better on low-dose Lexapro.  HPI: She came for his follow-up appointment.  He is taking Lexapro 10 mg daily.  He feels much better and he does not feel groggy, sleepy and tired.  Like to keep 10 mg.  He had a good summer.  He went to [place] where he has some property and he was busy fixing the property.  His job is going very well.  He denies any panic attack, feeling of hopelessness or worthlessness.  His appetite is okay.  His energy level is good.  He has no tremors shakes or any EPS.  He denies drinking or using any illegal substances.  He apologized not seeing his primary care physician and blood work but promised to have it done within a month.  Visit Diagnosis:    ICD-10-CM   1. 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 Patient has history of psychiatric inpatient treatment in December 2017 due to psychotic episode. He was also admitted in 1999 due to psychosis and depression. He was prescribed Wellbutrin, Depakote but he stopped the medication due to side effects. He was prescribed Abilify, trazodone and Ativan on his last admission but he stopped due to weight gain and felt he does not needed. Patient denies any history of suicidal attempt.  Past Medical History:  Past Medical History:  Diagnosis Date  . Allergy   . Anxiety   . Depression    No past surgical history on file.  Family Psychiatric History: Reviewed.  Family History:  Family History  Problem Relation Age of Onset  . Mental illness Father   . Heart disease Paternal Grandfather   . Mental illness Brother   . Suicidality Brother   . Depression Brother     Social History:  Social History   Socioeconomic History  . Marital status: Married    Spouse name: Not  on file  . Number of children: Not on file  . Years of education: Not on file  . Highest education level: Not on file  Occupational History  . Not on file  Social Needs  . Financial resource strain: Not on file  . Food insecurity:    Worry: Not on file    Inability: Not on file  . Transportation needs:    Medical: Not on file    Non-medical: Not on file  Tobacco Use  . Smoking status: Never Smoker  . Smokeless tobacco: Never Used  Substance and Sexual Activity  . Alcohol use: No    Comment: Less than 1 drink a week.   . Drug use: No  . Sexual activity: Yes    Partners: Female    Birth control/protection: None  Lifestyle  . Physical activity:    Days per week: Not on file    Minutes per session: Not on file  . Stress: Not on file  Relationships  . Social connections:    Talks on phone: Not on file    Gets together: Not on file    Attends religious service: Not on file    Active member of club or organization: Not on file    Attends meetings of clubs or organizations: Not on file    Relationship status: Not on file  Other Topics Concern  . Not on  file  Social History Narrative  . Not on file    Allergies: No Known Allergies  Metabolic Disorder Labs: Lab Results  Component Value Date   HGBA1C 5.0 10/05/2016   MPG 97 10/05/2016   Lab Results  Component Value Date   PROLACTIN 10.8 10/05/2016   Lab Results  Component Value Date   CHOL 190 10/05/2016   TRIG 108 10/05/2016   HDL 59 10/05/2016   CHOLHDL 3.2 10/05/2016   VLDL 22 10/05/2016   LDLCALC 109 (H) 10/05/2016   Lab Results  Component Value Date   TSH 0.758 10/05/2016    Therapeutic Level Labs: No results found for: LITHIUM No results found for: VALPROATE No components found for:  CBMZ  Current Medications: Current Outpatient Medications  Medication Sig Dispense Refill  . escitalopram (LEXAPRO) 20 MG tablet Take 1 tablet (20 mg total) by mouth daily. 90 tablet 1  . Omega-3 Fatty Acids (FISH  OIL PO) Take 1 capsule by mouth daily. Reported on 04/26/2016     No current facility-administered medications for this visit.      Musculoskeletal: Strength & Muscle Tone: within normal limits Gait & Station: normal Patient leans: N/A  Psychiatric Specialty Exam: ROS  Blood pressure 136/75, pulse 72, resp. rate 14, weight 233 lb 9.6 oz (106 kg).There is no height or weight on file to calculate BMI.  General Appearance: Casual  Eye Contact:  Good  Speech:  Clear and Coherent  Volume:  Normal  Mood:  Euthymic  Affect:  Congruent  Thought Process:  Goal Directed  Orientation:  Full (Time, Place, and Person)  Thought Content: Logical   Suicidal Thoughts:  No  Homicidal Thoughts:  No  Memory:  Immediate;   Good Recent;   Good Remote;   Good  Judgement:  Good  Insight:  Good  Psychomotor Activity:  Normal  Concentration:  Concentration: Good and Attention Span: Good  Recall:  Good  Fund of Knowledge: Good  Language: Good  Akathisia:  No  Handed:  Right  AIMS (if indicated): not done  Assets:  Communication Skills Desire for Improvement Housing Resilience  ADL's:  Intact  Cognition: WNL  Sleep:  Good   Screenings: AUDIT     [facility]  Alcohol Use Disorder Identification Test Final Score (AUDIT)  0    PHQ2-9     Office Visit from 01/06/2018 in [facility] at [facility] from 11/15/2016 in [facility] [place] Office Visit from 06/24/2015 in [facility] Office Visit from 06/09/2015 in [facility]  PHQ-2 Total Score  0  6  0  0  PHQ-9 Total Score  -  25  -  -       Assessment and Plan: Major depressive disorder, recurrent.  Anxiety disorder NOS.  Encouraged to see his primary care physician for physical and blood work.  Continue Lexapro 10 mg daily.  Recommended to call us back if is any question or any concern.   Follow-up in 6 months.   Cleotis Nipper, MD 07/13/2018, 3:18 PM

## 2018-07-14 DIAGNOSIS — Z23 Encounter for immunization: Secondary | ICD-10-CM | POA: Diagnosis not present

## 2018-10-30 ENCOUNTER — Encounter (HOSPITAL_COMMUNITY): Payer: Self-pay | Admitting: Psychiatry

## 2018-10-30 ENCOUNTER — Ambulatory Visit (INDEPENDENT_AMBULATORY_CARE_PROVIDER_SITE_OTHER): Payer: BLUE CROSS/BLUE SHIELD | Admitting: Psychiatry

## 2018-10-30 VITALS — BP 136/80 | HR 74 | Ht 74.0 in | Wt 230.0 lb

## 2018-10-30 DIAGNOSIS — F323 Major depressive disorder, single episode, severe with psychotic features: Secondary | ICD-10-CM

## 2018-10-30 DIAGNOSIS — F411 Generalized anxiety disorder: Secondary | ICD-10-CM | POA: Diagnosis not present

## 2018-10-30 MED ORDER — ESCITALOPRAM OXALATE 20 MG PO TABS: 20.0000 mg | ORAL_TABLET | Freq: Every day | ORAL | 0 refills | Status: DC

## 2018-10-30 NOTE — Progress Notes (Signed)
BH MD/PA/NP OP Progress Note  10/30/2018 1:58 PM Jerry Thornton  MRN:  [id number]  Chief Complaint: I started taking Lexapro 20 mg yesterday.  I was feeling anxious.  HPI: Patient came earlier than his scheduled appointment.  He admitted anxiety with poor attention and concentration in recent weeks.  He endorsed work-related stress and decided to go up on Lexapro 20 mg since yesterday.  He noticed improvement even though he took the medication 20 mg since yesterday.  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  He admitted lately he has been unable to pay full attention and concentration to his job.  He like to continue Lexapro 20 mg.  He feels the medicine working and he has no tremors shakes or any EPS.  He denies any mania, feeling of hopelessness, crying spells or any suicidal thoughts.  He denies any hallucination or any aggressive behavior.  He denies any panic attack.  Has any feeling of hopelessness or worthlessness.  His energy level is okay.  His appetite is okay.  Lately he is concerned about his 48 year old son who required surgery for Charcot's Merin foot.  Visit Diagnosis:    ICD-10-CM   1. GAD (generalized anxiety disorder) F41.1 escitalopram (LEXAPRO) 20 MG tablet  2. 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 History of psychiatric inpatient treatment in December 2017 due to psychotic episode. History of another inpatient in 1999 due to psychosis and depression. Prescribed Wellbutrin, Depakote but stopped due to side effects. Upon last discharged took Abilify, trazodone and Ativan but stopped due to weight gain. No history of suicidal attempt.  Past Medical History:  Past Medical History:  Diagnosis Date  . Allergy   . Anxiety   . Depression    No past surgical history on file.  Family Psychiatric History: Reviewed.  Family History:   Family History  Problem Relation Age of Onset  . Mental illness Father   . Heart disease Paternal Grandfather   . Mental illness Brother   . Suicidality Brother   . Depression Brother     Social History:  Social History   Socioeconomic History  . Marital status: Married    Spouse name: Not on file  . Number of children: Not on file  . Years of education: Not on file  . Highest education level: Not on file  Occupational History  . Not on file  Social Needs  . Financial resource strain: Not on file  . Food insecurity:    Worry: Not on file    Inability: Not on file  . Transportation needs:    Medical: Not on file    Non-medical: Not on file  Tobacco Use  . Smoking status: Never Smoker  . Smokeless tobacco: Never Used  Substance and Sexual Activity  . Alcohol use: No    Comment: Less than 1 drink a week.   . Drug use: No  . Sexual activity: Yes    Partners: Female    Birth control/protection: None  Lifestyle  . Physical activity:    Days per week: Not on file    Minutes per session: Not on file  . Stress: Not on file  Relationships  . Social connections:    Talks on phone: Not on file    Gets together: Not on file    Attends religious service: Not on file    Active member of club  or organization: Not on file    Attends meetings of clubs or organizations: Not on file    Relationship status: Not on file  Other Topics Concern  . Not on file  Social History Narrative  . Not on file    Allergies: No Known Allergies  Metabolic Disorder Labs: Lab Results  Component Value Date   HGBA1C 5.0 10/05/2016   MPG 97 10/05/2016   Lab Results  Component Value Date   PROLACTIN 10.8 10/05/2016   Lab Results  Component Value Date   CHOL 190 10/05/2016   TRIG 108 10/05/2016   HDL 59 10/05/2016   CHOLHDL 3.2 10/05/2016   VLDL 22 10/05/2016   LDLCALC 109 (H) 10/05/2016   Lab Results  Component Value Date   TSH 0.758 10/05/2016    Therapeutic Level Labs: No  results found for: LITHIUM No results found for: VALPROATE No components found for:  CBMZ  Current Medications: Current Outpatient Medications  Medication Sig Dispense Refill  . escitalopram (LEXAPRO) 10 MG tablet Take 1 tablet (10 mg total) by mouth daily. 90 tablet 1  . Omega-3 Fatty Acids (FISH OIL PO) Take 1 capsule by mouth daily. Reported on 04/26/2016     No current facility-administered medications for this visit.      Musculoskeletal: Strength & Muscle Tone: within normal limits Gait & Station: normal Patient leans: N/A  Psychiatric Specialty Exam: ROS  Blood pressure 136/80, pulse 74, height 6\' 2"  (1.88 m), weight 230 lb (104.3 kg).There is no height or weight on file to calculate BMI.  General Appearance: Well Groomed  Eye Contact:  Good  Speech:  Clear and Coherent  Volume:  Normal  Mood:  Anxious  Affect:  Appropriate  Thought Process:  Goal Directed  Orientation:  Full (Time, Place, and Person)  Thought Content: Logical   Suicidal Thoughts:  No  Homicidal Thoughts:  No  Memory:  Immediate;   Good Recent;   Good Remote;   Good  Judgement:  Good  Insight:  Good  Psychomotor Activity:  Normal  Concentration:  Concentration: Good and Attention Span: Good  Recall:  Good  Fund of Knowledge: Good  Language: Good  Akathisia:  No  Handed:  Right  AIMS (if indicated): not done  Assets:  Communication Skills Desire for Improvement Housing Physical Health Resilience  ADL's:  Intact  Cognition: WNL  Sleep:  Good   Screenings: AUDIT     [facility]  Alcohol Use Disorder Identification Test Final Score (AUDIT)  0    PHQ2-9     Office Visit from 01/06/2018 in [facility] at [facility] from 11/15/2016 in [facility] [place] Office Visit from 06/24/2015 in [facility] Office Visit from 06/09/2015 in [facility]   PHQ-2 Total Score  0  6  0  0  PHQ-9 Total Score  -  25  -  -       Assessment and Plan: Major depressive disorder, recurrent.  Anxiety.  Patient is taking Lexapro 20 mg since yesterday as he was feeling more anxious and having difficulty with attention and concentration.  He is seen improvement.  He has no side effects.  I recommended to continue Lexapro 20 mg daily however call us back if he has any noticed any side effects or worsening of the symptoms.  Encouraged to continue primary care physician for physical and blood work.  I will  see him again in 3 months.  Recommended to call us back if he has any question or any concerns.   Cleotis NipperSyed T Adriauna Campton, MD 10/30/2018, 1:58 PM

## 2019-01-11 ENCOUNTER — Ambulatory Visit (HOSPITAL_COMMUNITY): Payer: BLUE CROSS/BLUE SHIELD | Admitting: Psychiatry

## 2019-01-29 ENCOUNTER — Ambulatory Visit (INDEPENDENT_AMBULATORY_CARE_PROVIDER_SITE_OTHER): Payer: BLUE CROSS/BLUE SHIELD | Admitting: Psychiatry

## 2019-01-29 ENCOUNTER — Other Ambulatory Visit: Payer: Self-pay

## 2019-01-29 DIAGNOSIS — F323 Major depressive disorder, single episode, severe with psychotic features: Secondary | ICD-10-CM

## 2019-01-29 DIAGNOSIS — F411 Generalized anxiety disorder: Secondary | ICD-10-CM

## 2019-01-29 MED ORDER — ESCITALOPRAM OXALATE 20 MG PO TABS: 20.0000 mg | ORAL_TABLET | Freq: Every day | ORAL | 0 refills | Status: DC

## 2019-01-29 NOTE — Progress Notes (Signed)
Virtual Visit via Telephone Note  I connected with Jerry Thornton on 01/29/19 at  3:20 PM EDT by telephone and verified that I am speaking with the correct person using two identifiers.   I discussed the limitations, risks, security and privacy concerns of performing an evaluation and management service by telephone and the availability of in person appointments. I also discussed with the patient that there may be a patient responsible charge related to this service. The patient expressed understanding and agreed to proceed.   History of Present Illness: Patient was evaluated through phone conversation.  He has been doing well on Lexapro 20 mg daily.  He is working now full-time from home.  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  He is sleeping good.  His 33 year old son who needed surgery is extended from April to May.  He denies any crying spells or any feeling of hopelessness or worthlessness.  He admitted his attention concentration and energy level is good.  He reported no tremors or shakes.  He like to continue current Lexapro dose.  He denies any agitation, anger, paranoia or any hallucination.     Past Psychiatric History: Reviewed. History of psychiatric inpatient treatment in December 2017 due to psychotic episode. History of another inpatient in 1999 due to psychosis and depression. Prescribed Wellbutrin, Depakote but stopped due to side effects. Upon last discharged took Abilify, trazodone and Ativan but stopped due to weight gain. No history of suicidal attempt.   Observations/Objective: Limited mental status admission done on the phone.  Patient describes his mood okay.  His speech is soft, clear, coherent with normal tone and volume.  Denies any auditory or visual hallucination.  He denies any active or passive suicidal thoughts or homicidal thought.  There were no delusions, paranoia noticed.  His attention and concentration is okay.  He is alert  and oriented x3.  His fund of knowledge is adequate.  His cognition is good.  His insight judgment and impulse control is okay.  Assessment and Plan: Major depressive disorder, recurrent.  Anxiety.  Patient is a stable on Lexapro 20 mg daily.  He reported no tremors shakes or any EPS.  I will continue present dose of Lexapro 20 mg daily.  Recommended to call us back if he has any question or any concern.  Follow-up in 3 months.  Follow Up Instructions:    I discussed the assessment and treatment plan with the patient. The patient was provided an opportunity to ask questions and all were answered. The patient agreed with the plan and demonstrated an understanding of the instructions.   The patient was advised to call back or seek an in-person evaluation if the symptoms worsen or if the condition fails to improve as anticipated.  I provided 15 minutes of non-face-to-face time during this encounter.   Cleotis Nipper, MD

## 2019-05-09 ENCOUNTER — Encounter (HOSPITAL_COMMUNITY): Payer: Self-pay | Admitting: Psychiatry

## 2019-05-09 ENCOUNTER — Other Ambulatory Visit: Payer: Self-pay

## 2019-05-09 ENCOUNTER — Ambulatory Visit (INDEPENDENT_AMBULATORY_CARE_PROVIDER_SITE_OTHER): Payer: BC Managed Care – PPO | Admitting: Psychiatry

## 2019-05-09 DIAGNOSIS — F411 Generalized anxiety disorder: Secondary | ICD-10-CM | POA: Diagnosis not present

## 2019-05-09 DIAGNOSIS — F323 Major depressive disorder, single episode, severe with psychotic features: Secondary | ICD-10-CM

## 2019-05-09 MED ORDER — ESCITALOPRAM OXALATE 20 MG PO TABS: 20.0000 mg | ORAL_TABLET | Freq: Every day | ORAL | 1 refills | Status: DC

## 2019-05-09 NOTE — Progress Notes (Signed)
Virtual Visit via Telephone Note  I connected with Jerry Thornton on 05/09/19 at  4:20 PM EDT by telephone and verified that I am speaking with the correct person using two identifiers.   I discussed the limitations, risks, security and privacy concerns of performing an evaluation and management service by telephone and the availability of in person appointments. I also discussed with the patient that there may be a patient responsible charge related to this service. The patient expressed understanding and agreed to proceed.   History of Present Illness: Patient was evaluated through phone session.  He has been doing very well on his current dose of Lexapro 20 mg daily.  He reported no tremors, shakes or any EPS.  He denies any feeling of hopelessness or worthlessness.  He is working from home most of the time.  Currently he is visiting his family farm in [place] where he has a plan to stay there for another few months.  Denies any crying spells or any irritability.  He like to continue his medication.  His sleep is good.  His energy level is good.  His appetite is okay.  He denies any anger or any mania.   Past Psychiatric History:Reviewed. History of psychiatric inpatient treatment in December 2017 due to psychotic episode. History of anotherinpatientin 1999 due to psychosis and depression. PrescribedWellbutrin, Depakotebutstopped due to side effects. Upon last discharged tookAbilify, trazodone and Ativan butstopped due to weight gain. Nohistory of suicidal attempt.   Psychiatric Specialty Exam: Physical Exam  ROS  There were no vitals taken for this visit.There is no height or weight on file to calculate BMI.  General Appearance: NA  Eye Contact:  NA  Speech:  Clear and Coherent and Slow  Volume:  Normal  Mood:  Euthymic  Affect:  NA  Thought Process:  Goal Directed  Orientation:  Full (Time, Place, and Person)  Thought Content:  WDL and Logical  Suicidal Thoughts:   No  Homicidal Thoughts:  No  Memory:  Immediate;   Good Recent;   Good Remote;   Good  Judgement:  Good  Insight:  Good  Psychomotor Activity:  NA  Concentration:  Concentration: Good and Attention Span: Good  Recall:  Good  Fund of Knowledge:  Good  Language:  Good  Akathisia:  No  Handed:  Right  AIMS (if indicated):     Assets:  Communication Skills Desire for Improvement Housing Resilience Social Support  ADL's:  Intact  Cognition:  WNL  Sleep:         Assessment and Plan: Major depressive disorder, recurrent.  Anxiety.  Patient doing very well on his current medication.  He has no side effects.  I will continue Lexapro 20 mg daily.  His anxiety and depression is under control.  Discussed medication side effects and benefits.  Recommended to call us back if is any question or any concern.  He like to have his refill sent to local pharmacy in [place].  Follow-up in 6 months.  Follow Up Instructions:    I discussed the assessment and treatment plan with the patient. The patient was provided an opportunity to ask questions and all were answered. The patient agreed with the plan and demonstrated an understanding of the instructions.   The patient was advised to call back or seek an in-person evaluation if the symptoms worsen or if the condition fails to improve as anticipated.  I provided 15 minutes of non-face-to-face time during this encounter.  Kathlee Nations, MD

## 2019-07-19 DIAGNOSIS — Z23 Encounter for immunization: Secondary | ICD-10-CM | POA: Diagnosis not present

## 2019-08-30 ENCOUNTER — Other Ambulatory Visit (HOSPITAL_COMMUNITY): Payer: Self-pay | Admitting: Psychiatry

## 2019-08-30 DIAGNOSIS — F411 Generalized anxiety disorder: Secondary | ICD-10-CM

## 2019-08-30 DIAGNOSIS — F323 Major depressive disorder, single episode, severe with psychotic features: Secondary | ICD-10-CM

## 2019-09-03 ENCOUNTER — Other Ambulatory Visit (HOSPITAL_COMMUNITY): Payer: Self-pay | Admitting: Psychiatry

## 2019-09-03 DIAGNOSIS — F323 Major depressive disorder, single episode, severe with psychotic features: Secondary | ICD-10-CM

## 2019-09-03 DIAGNOSIS — F411 Generalized anxiety disorder: Secondary | ICD-10-CM

## 2019-09-04 ENCOUNTER — Other Ambulatory Visit (HOSPITAL_COMMUNITY): Payer: Self-pay | Admitting: Psychiatry

## 2019-09-04 DIAGNOSIS — F411 Generalized anxiety disorder: Secondary | ICD-10-CM

## 2019-09-04 DIAGNOSIS — F323 Major depressive disorder, single episode, severe with psychotic features: Secondary | ICD-10-CM

## 2019-09-05 ENCOUNTER — Telehealth (HOSPITAL_COMMUNITY): Payer: Self-pay | Admitting: *Deleted

## 2019-09-05 ENCOUNTER — Other Ambulatory Visit (HOSPITAL_COMMUNITY): Payer: Self-pay

## 2019-09-05 DIAGNOSIS — F323 Major depressive disorder, single episode, severe with psychotic features: Secondary | ICD-10-CM

## 2019-09-05 DIAGNOSIS — F411 Generalized anxiety disorder: Secondary | ICD-10-CM

## 2019-09-05 MED ORDER — ESCITALOPRAM OXALATE 20 MG PO TABS: 20.0000 mg | ORAL_TABLET | Freq: Every day | ORAL | 1 refills | Status: DC

## 2019-09-05 NOTE — Telephone Encounter (Signed)
Pt left message requesting refill on his Lexapro. Pt has appointment on 11/05/19.

## 2019-11-05 ENCOUNTER — Encounter (HOSPITAL_COMMUNITY): Payer: Self-pay | Admitting: Psychiatry

## 2019-11-05 ENCOUNTER — Ambulatory Visit (INDEPENDENT_AMBULATORY_CARE_PROVIDER_SITE_OTHER): Payer: Self-pay | Admitting: Psychiatry

## 2019-11-05 ENCOUNTER — Other Ambulatory Visit: Payer: Self-pay

## 2019-11-05 DIAGNOSIS — F323 Major depressive disorder, single episode, severe with psychotic features: Secondary | ICD-10-CM

## 2019-11-05 DIAGNOSIS — F411 Generalized anxiety disorder: Secondary | ICD-10-CM

## 2019-11-05 MED ORDER — ESCITALOPRAM OXALATE 20 MG PO TABS: 20.0000 mg | ORAL_TABLET | Freq: Every day | ORAL | 1 refills | Status: DC

## 2019-11-05 NOTE — Progress Notes (Signed)
Virtual Visit via Telephone Note  I connected with Jerry Thornton on 11/05/19 at  4:20 PM EST by telephone and verified that I am speaking with the correct person using two identifiers.   I discussed the limitations, risks, security and privacy concerns of performing an evaluation and management service by telephone and the availability of in person appointments. I also discussed with the patient that there may be a patient responsible charge related to this service. The patient expressed understanding and agreed to proceed.   History of Present Illness: Patient was evaluated through phone session.  He is doing well on his current medication.  Sometimes he takes Lexapro at night to see if he can sleep better.  But overall he feels medicine seems to be working.  He denies any crying spells or any feeling of hopelessness or worthlessness.  He is working from home.  He had a good Christmas.  He has no tremors, shakes or any EPS.  He denies any mania, psychosis or any hallucination.  Denies any major panic attack.  Since last visit there were no new medication added.  His energy level is good.  His appetite is okay.  He wants to continue current medication.  Denies changes in his weight.  He denies drinking or using any illegal substances.   Past Psychiatric History:Reviewed. H/O inpatient treatment in December 2017 due to psychotic episode. History of anotherinpatientin 1999 due to psychosis and depression. PrescribedWellbutrin, Depakotebutstopped due to side effects. Upon last discharged tookAbilify, trazodone and Ativan butstopped due to weight gain. Nohistory of suicidal attempt.   Psychiatric Specialty Exam: Physical Exam  Review of Systems  There were no vitals taken for this visit.There is no height or weight on file to calculate BMI.  General Appearance: NA  Eye Contact:  NA  Speech:  Clear and Coherent  Volume:  Normal  Mood:  Euthymic  Affect:  NA  Thought Process:  Goal  Directed  Orientation:  Full (Time, Place, and Person)  Thought Content:  WDL  Suicidal Thoughts:  No  Homicidal Thoughts:  No  Memory:  Immediate;   Good Recent;   Good Remote;   Good  Judgement:  Good  Insight:  Present  Psychomotor Activity:  NA  Concentration:  Concentration: Good and Attention Span: Good  Recall:  Good  Fund of Knowledge:  Good  Language:  Good  Akathisia:  No  Handed:  Right  AIMS (if indicated):     Assets:  Communication Skills Desire for Improvement Housing Resilience Social Support Talents/Skills Transportation  ADL's:  Intact  Cognition:  WNL  Sleep:   ok      Assessment and Plan: Major depressive disorder, recurrent.  Anxiety.  Patient is a stable on Lexapro 20 mg daily.  He has no tremors, shakes or any side effects.  Discussed medication side effects and benefits.  Recommended to call us back if he has any question of any concern.  Follow-up in 6 months.  Follow Up Instructions:    I discussed the assessment and treatment plan with the patient. The patient was provided an opportunity to ask questions and all were answered. The patient agreed with the plan and demonstrated an understanding of the instructions.   The patient was advised to call back or seek an in-person evaluation if the symptoms worsen or if the condition fails to improve as anticipated.  I provided 15 minutes of non-face-to-face time during this encounter.   Cleotis Nipper, MD

## 2020-03-05 DIAGNOSIS — H00012 Hordeolum externum right lower eyelid: Secondary | ICD-10-CM | POA: Diagnosis not present

## 2020-03-21 DIAGNOSIS — Z0000 Encounter for general adult medical examination without abnormal findings: Secondary | ICD-10-CM | POA: Diagnosis not present

## 2020-03-21 DIAGNOSIS — E785 Hyperlipidemia, unspecified: Secondary | ICD-10-CM | POA: Diagnosis not present

## 2020-05-05 ENCOUNTER — Ambulatory Visit (HOSPITAL_COMMUNITY): Payer: Self-pay | Admitting: Psychiatry

## 2020-05-06 ENCOUNTER — Ambulatory Visit (HOSPITAL_COMMUNITY): Payer: Self-pay | Admitting: Psychiatry

## 2020-05-19 ENCOUNTER — Telehealth (INDEPENDENT_AMBULATORY_CARE_PROVIDER_SITE_OTHER): Payer: Self-pay | Admitting: Psychiatry

## 2020-05-19 ENCOUNTER — Encounter (HOSPITAL_COMMUNITY): Payer: Self-pay | Admitting: Psychiatry

## 2020-05-19 ENCOUNTER — Other Ambulatory Visit: Payer: Self-pay

## 2020-05-19 VITALS — Wt 225.0 lb

## 2020-05-19 DIAGNOSIS — F419 Anxiety disorder, unspecified: Secondary | ICD-10-CM

## 2020-05-19 DIAGNOSIS — F323 Major depressive disorder, single episode, severe with psychotic features: Secondary | ICD-10-CM

## 2020-05-19 MED ORDER — ESCITALOPRAM OXALATE 20 MG PO TABS: 20.0000 mg | ORAL_TABLET | Freq: Every day | ORAL | 0 refills | Status: DC

## 2020-05-19 NOTE — Progress Notes (Signed)
Virtual Visit via Telephone Note  I connected with Jerry Thornton on 05/19/20 at  4:00 PM EDT by telephone and verified that I am speaking with the correct person using two identifiers.  Location: Patient: home in [place] Provider: home office   I discussed the limitations, risks, security and privacy concerns of performing an evaluation and management service by telephone and the availability of in person appointments. I also discussed with the patient that there may be a patient responsible charge related to this service. The patient expressed understanding and agreed to proceed.   History of Present Illness: Patient is evaluated by phone session.  He is doing well on his current medication.  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  He is not sure how long he will stay there.  He feels the medicine helps his depression and anxiety and he denies any recent feeling of hopelessness or worthlessness.  Energy level is good.  He is sleeping good.  He has no tremors, shakes or any EPS.  He is trying to lose weight and so far he has lost few pounds since the last visit.  He denies drinking or using any illegal substances.  He like to keep his current medication.  Past Psychiatric History:Reviewed. H/O inpatient in December 2017 and 1999 due to psychosis and depression. Given Wellbutrin, Depakote, Abilify, trazodone and Ativan butd/c due to sidee effects and weight gain. Nohistory of suicidal attempt.   Psychiatric Specialty Exam: Physical Exam  Review of Systems  Weight 225 lb (102.1 kg).Body mass index is 28.89 kg/m.  General Appearance: NA  Eye Contact:  NA  Speech:  Clear and Coherent  Volume:  Normal  Mood:  Euthymic  Affect:  NA  Thought Process:  Goal Directed  Orientation:  Full (Time, Place, and Person)  Thought Content:  WDL and Logical  Suicidal Thoughts:  No  Homicidal Thoughts:  No  Memory:  Immediate;   Good Recent;    Good Remote;   Good  Judgement:  Good  Insight:  Good  Psychomotor Activity:  NA  Concentration:  Concentration: Good and Attention Span: Good  Recall:  Good  Fund of Knowledge:  Good  Language:  Good  Akathisia:  No  Handed:  Right  AIMS (if indicated):     Assets:  Communication Skills Desire for Improvement Housing Resilience Social Support Talents/Skills Transportation  ADL's:  Intact  Cognition:  WNL  Sleep:   good      Assessment and Plan: Major depressive disorder, recurrent.  Anxiety.  Patient is doing well and stable on his current medication.  Continue Lexapro 20 mg daily.  Discussed medication side effects and benefits.  Recommended to call us back if is any question or any concern.  Follow-up in 6 months.  We will send a 90-day prescription to his pharmacy in [place] but he will call us back for future refills.  Follow Up Instructions:    I discussed the assessment and treatment plan with the patient. The patient was provided an opportunity to ask questions and all were answered. The patient agreed with the plan and demonstrated an understanding of the instructions.   The patient was advised to call back or seek an in-person evaluation if the symptoms worsen or if the condition fails to improve as anticipated.  I provided 10 minutes of non-face-to-face time during this encounter.   Cleotis Nipper, MD

## 2020-05-22 ENCOUNTER — Telehealth (HOSPITAL_COMMUNITY): Payer: Self-pay | Admitting: Psychiatry

## 2020-08-07 ENCOUNTER — Telehealth (INDEPENDENT_AMBULATORY_CARE_PROVIDER_SITE_OTHER): Payer: BC Managed Care – PPO | Admitting: Psychiatry

## 2020-08-07 ENCOUNTER — Other Ambulatory Visit: Payer: Self-pay

## 2020-08-07 ENCOUNTER — Encounter (HOSPITAL_COMMUNITY): Payer: Self-pay | Admitting: Psychiatry

## 2020-08-07 VITALS — Wt 218.0 lb

## 2020-08-07 DIAGNOSIS — F419 Anxiety disorder, unspecified: Secondary | ICD-10-CM

## 2020-08-07 DIAGNOSIS — F323 Major depressive disorder, single episode, severe with psychotic features: Secondary | ICD-10-CM | POA: Diagnosis not present

## 2020-08-07 NOTE — Progress Notes (Signed)
Virtual Visit via Telephone Note  I connected with Jerry Thornton on 08/07/20 at 11:20 AM EDT by telephone and verified that I am speaking with the correct person using two identifiers.  Location: Patient: home Provider: Home office   I discussed the limitations, risks, security and privacy concerns of performing an evaluation and management service by telephone and the availability of in person appointments. I also discussed with the patient that there may be a patient responsible charge related to this service. The patient expressed understanding and agreed to proceed.   History of Present Illness: Patient requested earlier appointment.  Patient told he is doing very well and he had cut down his Lexapro from 20mg  to 10 mg more than a week ago.  He feels his energy level is improved and he is more active.  He endorsed chronic complaining of sluggishness of the 20 mg Lexapro.  He had tried taking in the morning and at night but continued to have on and off sluggishness.  Since he cut down the dose he is feeling good.  He like to eventually come off of the medication but realized it is a long process and does not want to do it abruptly.  He is more focused on his general health.  He is trying to lose weight and started exercise.  Denies any crying spells, feeling of hopelessness or worthlessness.  He denies any anxiety and he is sleeping good.  He has no tremors shakes or any EPS.  He is still travels to and next week he is going there.  Past Psychiatric History:Reviewed. H/Oinpatient in December 2017 and 1999 due to psychosis and depression. Given Wellbutrin, Depakote, Abilify, trazodone and Ativan butd/c due to sidee effects and weight gain. Nohistory of suicidal attempt.   Psychiatric Specialty Exam: Physical Exam  Review of Systems  Weight 218 lb (98.9 kg).There is no height or weight on file to calculate BMI.  General Appearance: NA  Eye Contact:  NA  Speech:  Clear and  Coherent  Volume:  Normal  Mood:  Euthymic  Affect:  NA  Thought Process:  Goal Directed  Orientation:  Full (Time, Place, and Person)  Thought Content:  Logical  Suicidal Thoughts:  No  Homicidal Thoughts:  No  Memory:  Immediate;   Good Recent;   Good Remote;   Good  Judgement:  Good  Insight:  Good  Psychomotor Activity:  NA  Concentration:  Concentration: Good and Attention Span: Good  Recall:  Good  Fund of Knowledge:  Good  Language:  Good  Akathisia:  No  Handed:  Right  AIMS (if indicated):     Assets:  Communication Skills Desire for Improvement Housing Resilience Social Support Talents/Skills Transportation  ADL's:  Intact  Cognition:  WNL  Sleep:   ok      Assessment and Plan: Major depressive disorder, recurrent.  Anxiety.  I discussed with the patient about his past history, previous medication and risk of cutting down the dose of the medication.  In the past we have tried 10 mg but that he call and went back to 20 mg because her symptoms are coming back.  He feels the situation is much better and he can handle his symptoms better than before.  He promised to give 2000 a call if symptoms started to come back and he agreed to go back on 20 mg if needed.  I agree with the plan that he can continue 10 mg for now and if  symptoms come back and he will call us.  He also agreed to have a follow-up in 3 months.  He will use his leftover Lexapro 20 mg to cut down and a half and take 10 mg.  Follow Up Instructions:    I discussed the assessment and treatment plan with the patient. The patient was provided an opportunity to ask questions and all were answered. The patient agreed with the plan and demonstrated an understanding of the instructions.   The patient was advised to call back or seek an in-person evaluation if the symptoms worsen or if the condition fails to improve as anticipated.  I provided 14 minutes of non-face-to-face time during this  encounter.   Cleotis Nipper, MD

## 2020-11-18 ENCOUNTER — Telehealth (INDEPENDENT_AMBULATORY_CARE_PROVIDER_SITE_OTHER): Payer: BC Managed Care – PPO | Admitting: Psychiatry

## 2020-11-18 ENCOUNTER — Encounter (HOSPITAL_COMMUNITY): Payer: Self-pay | Admitting: Psychiatry

## 2020-11-18 ENCOUNTER — Other Ambulatory Visit: Payer: Self-pay

## 2020-11-18 DIAGNOSIS — F323 Major depressive disorder, single episode, severe with psychotic features: Secondary | ICD-10-CM

## 2020-11-18 DIAGNOSIS — F419 Anxiety disorder, unspecified: Secondary | ICD-10-CM

## 2020-11-18 MED ORDER — ESCITALOPRAM OXALATE 10 MG PO TABS: 10.0000 mg | ORAL_TABLET | Freq: Every day | ORAL | 0 refills | Status: DC

## 2020-11-18 NOTE — Progress Notes (Signed)
Virtual Visit via Telephone Note  I connected with Jerry Thornton on 11/18/20 at  4:20 PM EST by telephone and verified that I am speaking with the correct person using two identifiers.  Location: Patient: Home Provider: Home Office   I discussed the limitations, risks, security and privacy concerns of performing an evaluation and management service by telephone and the availability of in person appointments. I also discussed with the patient that there may be a patient responsible charge related to this service. The patient expressed understanding and agreed to proceed.   History of Present Illness: Patient is evaluated by phone session.  He is on the phone by himself.  He is taking Lexapro 10 mg for more than 3 months.  He noticed there are few times when he was quick to react and have temper but he feel he managed well.  He endorsed some stressful situation related to work but was manageable.  He had fell and injured his left ankle.  He was on boots for a while and now he is using braces.  He is doing physical therapy and following with a physician.  He admitted not able to jog and hiking and admitted weight gain.  Patient told it will be a long process once his ankle get better.  He feels with 10 mg of Lexapro working.  He denies any crying spells of feeling of hopelessness.  He is sleeping good.  His energy level is good.  Denies any panic attack.  He has no tremors, shakes or any EPS.  His family is doing good.  He like to keep his current medication.  Past Psychiatric History:Reviewed. H/Oinpatient in December 2017and 1999due to psychosis and depression. GivenWellbutrin, Depakote,Abilify, trazodone and Ativan butd/c due to sidee effects andweight gain. Nohistory of suicidal attempt.   Psychiatric Specialty Exam: Physical Exam  Review of Systems  Weight 230 lb (104.3 kg).There is no height or weight on file to calculate BMI.  General Appearance: NA  Eye Contact:  NA  Speech:   Normal Rate  Volume:  Normal  Mood:  Euthymic  Affect:  NA  Thought Process:  Goal Directed  Orientation:  Full (Time, Place, and Person)  Thought Content:  WDL  Suicidal Thoughts:  No  Homicidal Thoughts:  No  Memory:  Immediate;   Good Recent;   Good Remote;   Good  Judgement:  Intact  Insight:  Present  Psychomotor Activity:  NA  Concentration:  Concentration: Good and Attention Span: Good  Recall:  Good  Fund of Knowledge:  Good  Language:  Good  Akathisia:  No  Handed:  Right  AIMS (if indicated):     Assets:  Communication Skills Desire for Improvement Housing Resilience Social Support Talents/Skills Transportation  ADL's:  Intact  Cognition:  WNL  Sleep:   ok      Assessment and Plan: Major depressive disorder, recurrent.  Anxiety.  Patient overall stable on 10 mg Lexapro.  Other than few episodes of quick temper he feels his managed well with the stressful situation.  He is hoping once his ankle get better he can resume his exercise.  Discussed healthy lifestyle and watch his calorie intake.  Patient like to keep the Lexapro 10 mg for now.  Discussed medication side effects and benefits.  Patient is aware that he can call us if he feels worsening of the symptoms.  Follow-up in 3 months.  Follow Up Instructions:    I discussed the assessment and treatment plan with the  patient. The patient was provided an opportunity to ask questions and all were answered. The patient agreed with the plan and demonstrated an understanding of the instructions.   The patient was advised to call back or seek an in-person evaluation if the symptoms worsen or if the condition fails to improve as anticipated.  I provided 12 minutes of non-face-to-face time during this encounter.   Cleotis Nipper, MD

## 2021-02-09 ENCOUNTER — Other Ambulatory Visit: Payer: Self-pay

## 2021-02-12 ENCOUNTER — Telehealth (HOSPITAL_COMMUNITY): Payer: Self-pay | Admitting: *Deleted

## 2021-02-12 DIAGNOSIS — F419 Anxiety disorder, unspecified: Secondary | ICD-10-CM

## 2021-02-12 DIAGNOSIS — F323 Major depressive disorder, single episode, severe with psychotic features: Secondary | ICD-10-CM

## 2021-02-12 MED ORDER — ESCITALOPRAM OXALATE 20 MG PO TABS: 20.0000 mg | ORAL_TABLET | Freq: Every day | ORAL | 0 refills | Status: DC

## 2021-02-12 NOTE — Telephone Encounter (Signed)
Patient called and request an increase of Lexapro to 20mg  and requested a 90 day script.  His next appt is 02/24/21.

## 2021-02-12 NOTE — Telephone Encounter (Signed)
Done

## 2021-02-16 ENCOUNTER — Telehealth (HOSPITAL_COMMUNITY): Payer: Self-pay | Admitting: Psychiatry

## 2021-02-24 ENCOUNTER — Telehealth (HOSPITAL_COMMUNITY): Payer: Self-pay | Admitting: *Deleted

## 2021-02-24 ENCOUNTER — Other Ambulatory Visit: Payer: Self-pay

## 2021-02-24 ENCOUNTER — Other Ambulatory Visit (HOSPITAL_COMMUNITY): Payer: Self-pay | Admitting: *Deleted

## 2021-02-24 ENCOUNTER — Encounter (HOSPITAL_COMMUNITY): Payer: Self-pay | Admitting: Psychiatry

## 2021-02-24 ENCOUNTER — Telehealth (INDEPENDENT_AMBULATORY_CARE_PROVIDER_SITE_OTHER): Payer: BC Managed Care – PPO | Admitting: Psychiatry

## 2021-02-24 VITALS — Wt 225.0 lb

## 2021-02-24 DIAGNOSIS — F419 Anxiety disorder, unspecified: Secondary | ICD-10-CM

## 2021-02-24 DIAGNOSIS — F323 Major depressive disorder, single episode, severe with psychotic features: Secondary | ICD-10-CM

## 2021-02-24 DIAGNOSIS — Z79899 Other long term (current) drug therapy: Secondary | ICD-10-CM

## 2021-02-24 MED ORDER — ESCITALOPRAM OXALATE 20 MG PO TABS: 20.0000 mg | ORAL_TABLET | Freq: Every day | ORAL | 0 refills | Status: DC

## 2021-02-24 NOTE — Telephone Encounter (Signed)
Vm left advising pt that labs have been ordered to [facility] [address] [facility] in [place].

## 2021-02-24 NOTE — Progress Notes (Signed)
Virtual Visit via Telephone Note  I connected with Jerry Thornton on 02/24/21 at  4:00 PM EDT by telephone and verified that I am speaking with the correct person using two identifiers.  Location: Patient: Home Provider: Office   I discussed the limitations, risks, security and privacy concerns of performing an evaluation and management service by telephone and the availability of in person appointments. I also discussed with the patient that there may be a patient responsible charge related to this service. The patient expressed understanding and agreed to proceed.   History of Present Illness: Patient is evaluated by phone session.  He is now taking Lexapro 20 mg.  On the last visit he like to try 10 mg but recently he called [place] back and want to go back to 20 mg as he was noticing having irritability, lack of energy, lack of motivation and not able to function at work.  Since he is back on 20 mg he noticed much improvement in his mood, motivation and sleep.  He also cut down his drinking and noticed improvement in his weight and energy level.  He was able to lost few pounds since the last visit.  Overall he feels things are going well.  He denies any feeling of hopelessness, crying spells or any suicidal thoughts.  He denies any panic attack or any anxiety attack.  He has not seen his PCP in a while.  He has no issues or concerns from his current medication.  He did well on Lexapro 20 mg and now he like to keep the current dose.   Past Psychiatric History:Reviewed. H/Oinpatient in December 2017and 1999due to psychosis and depression. GivenWellbutrin, Depakote,Abilify, trazodone and Ativan butd/c due to sidee effects andweight gain. Nohistory of suicidal attempt.  Psychiatric Specialty Exam: Physical Exam  Review of Systems  Weight 225 lb (102.1 kg).There is no height or weight on file to calculate BMI.  General Appearance: NA  Eye Contact:  NA  Speech:  Clear and Coherent and  Normal Rate  Volume:  Normal  Mood:  Euthymic  Affect:  NA  Thought Process:  Goal Directed  Orientation:  Full (Time, Place, and Person)  Thought Content:  WDL  Suicidal Thoughts:  No  Homicidal Thoughts:  No  Memory:  Immediate;   Good Recent;   Good Remote;   Good  Judgement:  Good  Insight:  Present  Psychomotor Activity:  NA  Concentration:  Concentration: Good and Attention Span: Good  Recall:  Good  Fund of Knowledge:  Good  Language:  Good  Akathisia:  No  Handed:  Right  AIMS (if indicated):     Assets:  Communication Skills Desire for Improvement Housing Resilience Social Support Talents/Skills Transportation  ADL's:  Intact  Cognition:  WNL  Sleep:   ok      Assessment and Plan: Major depressive disorder, recurrent.  Anxiety.  Patient is back on Lexapro 20 mg and noticed much improvement in his mood irritability, motivation.  He has no side effects.  We will order CBC, CMP, hemoglobin A1c and TSH.  Discussed medication side effects and benefits.  Recommended to call us back if is any question or any concern.  Follow-up in 4 months.  Follow Up Instructions:    I discussed the assessment and treatment plan with the patient. The patient was provided an opportunity to ask questions and all were answered. The patient agreed with the plan and demonstrated an understanding of the instructions.   The patient was  advised to call back or seek an in-person evaluation if the symptoms worsen or if the condition fails to improve as anticipated.  I provided 16 minutes of non-face-to-face time during this encounter.   Cleotis Nipper, MD

## 2021-06-29 ENCOUNTER — Other Ambulatory Visit: Payer: Self-pay

## 2021-06-29 ENCOUNTER — Telehealth (INDEPENDENT_AMBULATORY_CARE_PROVIDER_SITE_OTHER): Payer: BC Managed Care – PPO | Admitting: Psychiatry

## 2021-06-29 ENCOUNTER — Encounter (HOSPITAL_COMMUNITY): Payer: Self-pay | Admitting: Psychiatry

## 2021-06-29 DIAGNOSIS — F323 Major depressive disorder, single episode, severe with psychotic features: Secondary | ICD-10-CM | POA: Diagnosis not present

## 2021-06-29 DIAGNOSIS — F419 Anxiety disorder, unspecified: Secondary | ICD-10-CM

## 2021-06-29 MED ORDER — ESCITALOPRAM OXALATE 20 MG PO TABS: 20.0000 mg | ORAL_TABLET | Freq: Every day | ORAL | 1 refills | Status: DC

## 2021-06-29 NOTE — Progress Notes (Signed)
Virtual Visit via Telephone Note  I connected with Jerry Thornton on 06/29/21 at  3:20 PM EDT by telephone and verified that I am speaking with the correct person using two identifiers.  Location: Patient: Home Provider: Home Office   I discussed the limitations, risks, security and privacy concerns of performing an evaluation and management service by telephone and the availability of in person appointments. I also discussed with the patient that there may be a patient responsible charge related to this service. The patient expressed understanding and agreed to proceed.   History of Present Illness: Patient is evaluated by phone session.  He is taking Lexapro 20 mg and he noticed much improvement in his mood, anxiety and nervousness.  He feels his energy is good and does not feel tired.  He denies any feeling of hopelessness or worthlessness.  He is sleeping good.  During the summer and he had some time off when he visited [place].  He has no tremors, shakes or any EPS.  He denies any suicidal thoughts or any crying spells.  His energy level is good.  He started a vegan diet and hoping to lose weight.  He has upcoming appointment with his PCP at [facility] physician in 2 weeks.  Patient has no tremors shakes or any EPS.  Denies any panic attacks.  Past Psychiatric History: Reviewed. H/O inpatient in December 2017 and 1999 due to psychosis and depression. Given Wellbutrin, Depakote, Abilify, trazodone and Ativan but d/c due to sidee effects and weight gain. No history of suicidal attempt.  Psychiatric Specialty Exam: Physical Exam  Review of Systems  Weight 225 lb (102.1 kg).There is no height or weight on file to calculate BMI.  General Appearance: NA  Eye Contact:  NA  Speech:  Clear and Coherent  Volume:  Normal  Mood:  Euthymic  Affect:  NA  Thought Process:  Goal Directed  Orientation:  Full (Time, Place, and Person)  Thought Content:  Logical  Suicidal Thoughts:  No   Homicidal Thoughts:  No  Memory:  Immediate;   Good Recent;   Good Remote;   Good  Judgement:  Good  Insight:  Present  Psychomotor Activity:  NA  Concentration:  Concentration: Good and Attention Span: Good  Recall:  Good  Fund of Knowledge:  Good  Language:  Good  Akathisia:  No  Handed:  Right  AIMS (if indicated):     Assets:  Communication Skills Desire for Improvement Housing Resilience Social Support  ADL's:  Intact  Cognition:  WNL  Sleep:   ok      Assessment and Plan: Major depressive disorder, recurrent.  Anxiety.  Patient is stable on Lexapro 20 mg daily.  Recommended to have his blood work done as he has appointment with PCP in 2 weeks.  Recommended to call us back if is any question or any concern.  Follow-up in 6 months.  Follow Up Instructions:    I discussed the assessment and treatment plan with the patient. The patient was provided an opportunity to ask questions and all were answered. The patient agreed with the plan and demonstrated an understanding of the instructions.   The patient was advised to call back or seek an in-person evaluation if the symptoms worsen or if the condition fails to improve as anticipated.  I provided 11 minutes of non-face-to-face time during this encounter.   Cleotis Nipper, MD

## 2021-11-09 ENCOUNTER — Other Ambulatory Visit: Payer: Self-pay | Admitting: Family Medicine

## 2021-11-09 DIAGNOSIS — E785 Hyperlipidemia, unspecified: Secondary | ICD-10-CM

## 2021-11-17 ENCOUNTER — Other Ambulatory Visit: Payer: Self-pay | Admitting: Family Medicine

## 2021-11-17 DIAGNOSIS — R748 Abnormal levels of other serum enzymes: Secondary | ICD-10-CM

## 2021-11-23 ENCOUNTER — Ambulatory Visit
Admission: RE | Admit: 2021-11-23 | Discharge: 2021-11-23 | Disposition: A | Discharge status: Home / Self Care | Payer: BC Managed Care – PPO | Source: Ambulatory Visit | Attending: Family Medicine | Admitting: Family Medicine

## 2021-11-23 DIAGNOSIS — R748 Abnormal levels of other serum enzymes: Secondary | ICD-10-CM

## 2021-12-04 ENCOUNTER — Ambulatory Visit
Admission: RE | Admit: 2021-12-04 | Discharge: 2021-12-04 | Disposition: A | Discharge status: Home / Self Care | Payer: No Typology Code available for payment source | Source: Ambulatory Visit | Attending: Family Medicine | Admitting: Family Medicine

## 2021-12-04 ENCOUNTER — Other Ambulatory Visit: Payer: Self-pay

## 2021-12-04 DIAGNOSIS — E785 Hyperlipidemia, unspecified: Secondary | ICD-10-CM

## 2021-12-28 ENCOUNTER — Encounter (HOSPITAL_COMMUNITY): Payer: Self-pay | Admitting: Psychiatry

## 2021-12-28 ENCOUNTER — Other Ambulatory Visit: Payer: Self-pay

## 2021-12-28 ENCOUNTER — Telehealth (HOSPITAL_BASED_OUTPATIENT_CLINIC_OR_DEPARTMENT_OTHER): Payer: BC Managed Care – PPO | Admitting: Psychiatry

## 2021-12-28 DIAGNOSIS — F419 Anxiety disorder, unspecified: Secondary | ICD-10-CM | POA: Diagnosis not present

## 2021-12-28 DIAGNOSIS — F323 Major depressive disorder, single episode, severe with psychotic features: Secondary | ICD-10-CM

## 2021-12-28 MED ORDER — ESCITALOPRAM OXALATE 20 MG PO TABS: 20.0000 mg | ORAL_TABLET | Freq: Every day | ORAL | 1 refills | Status: DC

## 2021-12-28 NOTE — Progress Notes (Signed)
Virtual Visit via Telephone Note ? ?I connected with Jerry Thornton on 12/28/21 at  3:20 PM EDT by telephone and verified that I am speaking with the correct person using two identifiers. ? ?Location: ?Patient: Home ?Provider: Home Office ?  ?I discussed the limitations, risks, security and privacy concerns of performing an evaluation and management service by telephone and the availability of in person appointments. I also discussed with the patient that there may be a patient responsible charge related to this service. The patient expressed understanding and agreed to proceed. ? ? ?History of Present Illness: ?Patient is evaluated by phone session.  He is taking Lexapro 20 mg and so far no major issues and he reported his anxiety depression is stable.  He is productive and busy at his work.  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  His reading level is good.  He denies any tremors or shakes.  He denies any anhedonia, feeling of hopelessness or worthlessness.  He is trying to lose weight and he lost another 10 pounds in past 6 months.  He is doing exercise and watching his calorie intake.  He had blood work at his PCP and there were no new medication added.  Patient like to keep the current dose of Lexapro.  His family is doing good. ? ?Past Psychiatric History: Reviewed. ?H/O inpatient in December 2017 and 1999 due to psychosis and depression. Given Wellbutrin, Depakote, Abilify, trazodone and Ativan but d/c due to sidee effects and weight gain. No history of suicidal attempt. ? ?Psychiatric Specialty Exam: ?Physical Exam  ?Review of Systems  ?Weight 210 lb (95.3 kg).There is no height or weight on file to calculate BMI.  ?General Appearance: NA  ?Eye Contact:  NA  ?Speech:  Clear and Coherent and Normal Rate  ?Volume:  Normal  ?Mood:  Euthymic  ?Affect:  NA  ?Thought Process:  Goal Directed  ?Orientation:  Full (Time, Place, and Person)  ?Thought Content:  Logical  ?Suicidal Thoughts:  No  ?Homicidal  Thoughts:  No  ?Memory:  Immediate;   Good ?Recent;   Good ?Remote;   Good  ?Judgement:  Intact  ?Insight:  Present  ?Psychomotor Activity:  NA  ?Concentration:  Concentration: Good and Attention Span: Good  ?Recall:  Good  ?Fund of Knowledge:  Good  ?Language:  Good  ?Akathisia:  No  ?Handed:  Right  ?AIMS (if indicated):     ?Assets:  Communication Skills ?Desire for Improvement ?Housing ?Resilience ?Social Support ?Talents/Skills ?Transportation  ?ADL's:  Intact  ?Cognition:  WNL  ?Sleep:   ok  ? ? ? ? ?Assessment and Plan: ?Major depressive disorder, recurrent.  Anxiety. ? ?Patient doing well and stable on Lexapro 20 mg every day.  Encouraged to continue to watch his calorie intake and regular exercise.  Discussed medication side effects and benefits.  Recommended to call us back if is any question or any concern.  Follow-up in 6 months. ? ?Follow Up Instructions: ? ?  ?I discussed the assessment and treatment plan with the patient. The patient was provided an opportunity to ask questions and all were answered. The patient agreed with the plan and demonstrated an understanding of the instructions. ?  ?The patient was advised to call back or seek an in-person evaluation if the symptoms worsen or if the condition fails to improve as anticipated. ? ?I provided 12 minutes of non-face-to-face time during this encounter. ? ? ?Cleotis Nipper, MD  ?

## 2022-04-07 ENCOUNTER — Other Ambulatory Visit: Payer: Self-pay | Admitting: Family Medicine

## 2022-04-07 DIAGNOSIS — K769 Liver disease, unspecified: Secondary | ICD-10-CM

## 2022-04-07 DIAGNOSIS — K760 Fatty (change of) liver, not elsewhere classified: Secondary | ICD-10-CM

## 2022-04-12 ENCOUNTER — Ambulatory Visit
Admission: RE | Admit: 2022-04-12 | Discharge: 2022-04-12 | Disposition: A | Discharge status: Home / Self Care | Payer: BC Managed Care – PPO | Source: Ambulatory Visit | Attending: Family Medicine | Admitting: Family Medicine

## 2022-04-12 DIAGNOSIS — K760 Fatty (change of) liver, not elsewhere classified: Secondary | ICD-10-CM

## 2022-04-12 DIAGNOSIS — K769 Liver disease, unspecified: Secondary | ICD-10-CM

## 2022-05-25 ENCOUNTER — Telehealth (HOSPITAL_COMMUNITY): Payer: Self-pay | Admitting: *Deleted

## 2022-05-25 DIAGNOSIS — F419 Anxiety disorder, unspecified: Secondary | ICD-10-CM

## 2022-05-25 DIAGNOSIS — F323 Major depressive disorder, single episode, severe with psychotic features: Secondary | ICD-10-CM

## 2022-05-25 MED ORDER — ESCITALOPRAM OXALATE 10 MG PO TABS: 10.0000 mg | ORAL_TABLET | Freq: Every day | ORAL | 0 refills | Status: DC

## 2022-05-25 NOTE — Telephone Encounter (Signed)
I send the new prescription.

## 2022-05-25 NOTE — Telephone Encounter (Signed)
Pt called requesting refill of Lexapro however not the 20 mg, but 10 mg. Pt has cut down the dose to 10 mg currently. Writer attempted to call pt back, but was unable to contact, to see if pt was having any s/e on the higher dose or just trying to himself off of medication. Pt has an upcoming appointment on 06/30/22. Please review and advise.

## 2022-06-09 IMAGING — CT CT CARDIAC CORONARY ARTERY CALCIUM SCORE
3 series · 14 of 20 positions shown, 16 images · non-contrast
Comparison: None.

CLINICAL DATA: 55-year-old white male with hyperlipidemia.

EXAM:
CT CARDIAC CORONARY ARTERY CALCIUM SCORE
TECHNIQUE: Non-contrast imaging through the heart was performed using
prospective ECG gating. Image post processing was performed on an
independent workstation, allowing for quantitative analysis of the
heart and coronary arteries. Note that this exam targets the heart
and the chest was not imaged in its entirety.

[Series 2: calcium scoring 2.00 qr36 bestdiast 70% hrt calciu · axial · 0.40mm/px · z∈[+1615,+1723]mm · 4 of 90 slices shown]
[im 18/90  vessel]
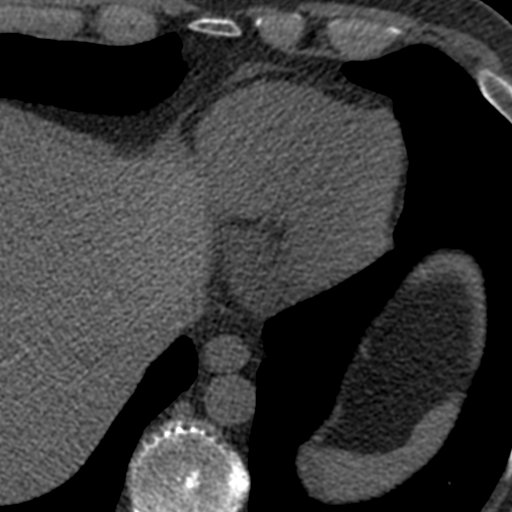
[im 36/90  vessel]
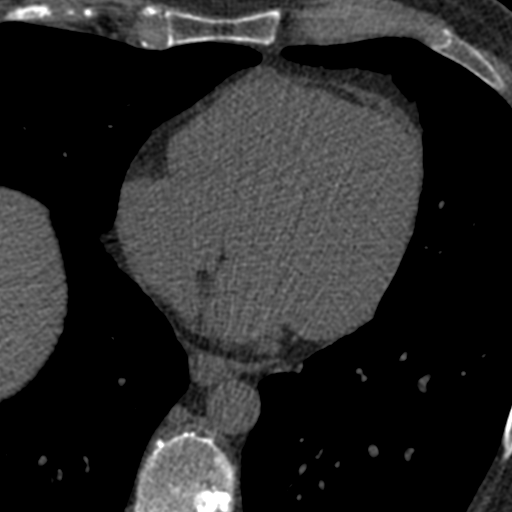
[im 54/90  vessel]
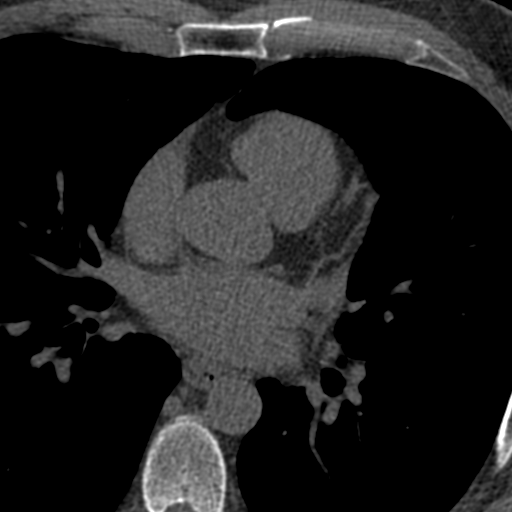
[im 72/90  vessel]
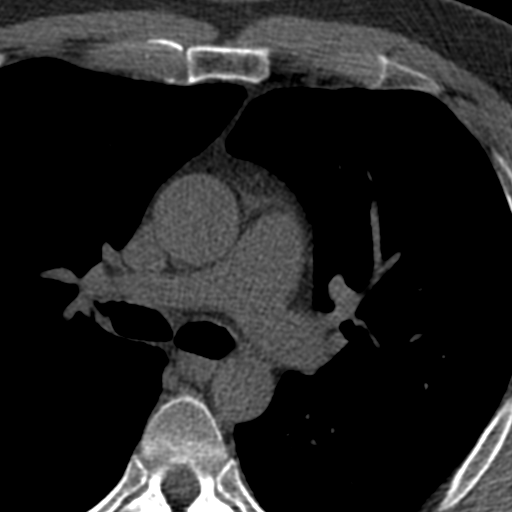

[Series 3: calcium scoring 2.00 br40 bestdiast 70% axial · axial · 0.63mm/px · z∈[+1609,+1729]mm · 5 of 90 slices shown, 7 images]
[im 15/90  vessel]
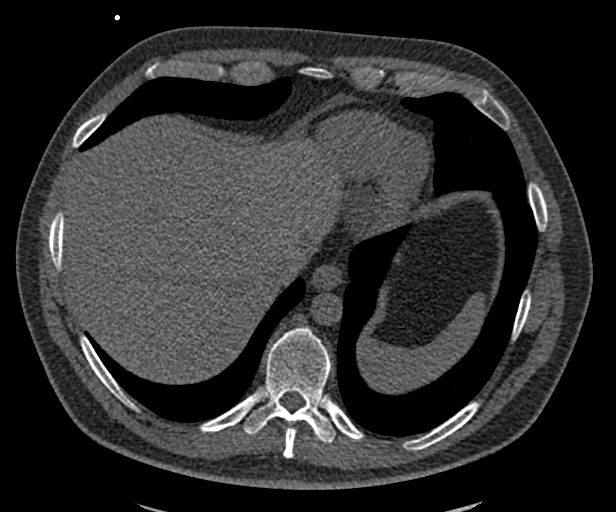
[im 15/90  lung]
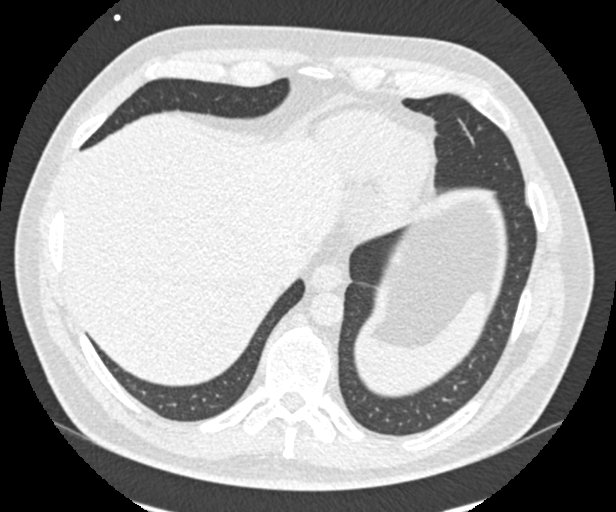
[im 30/90  vessel]
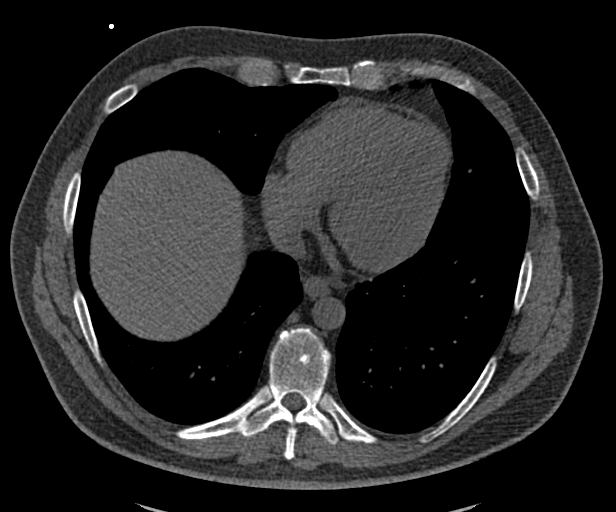
[im 45/90  vessel]
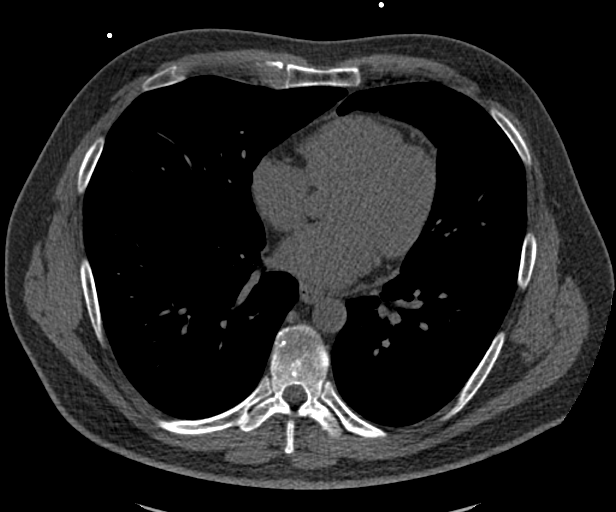
[im 60/90  vessel]
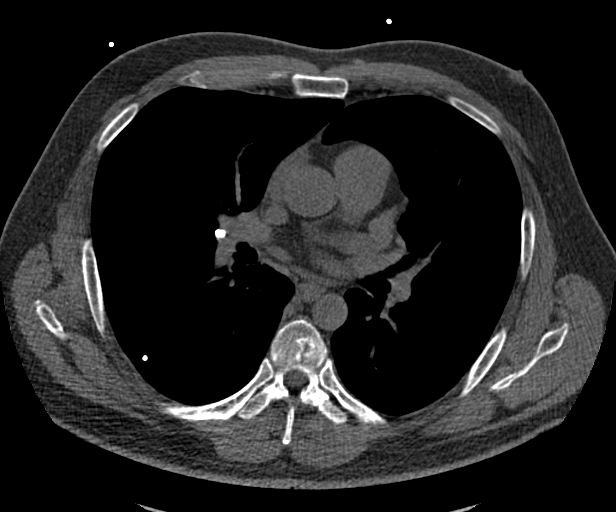
[im 75/90  vessel]
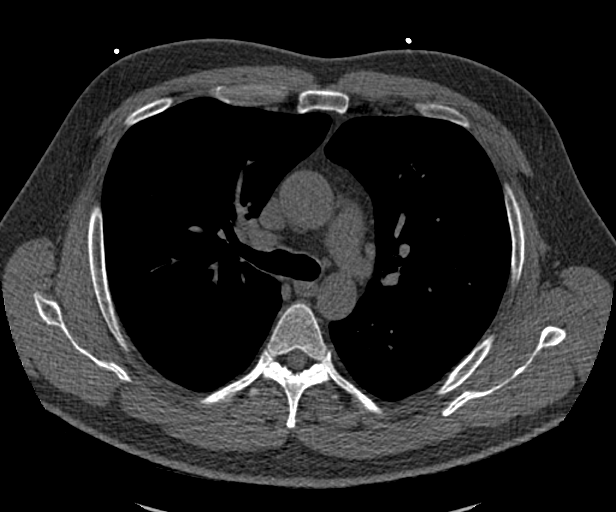
[im 75/90  lung]
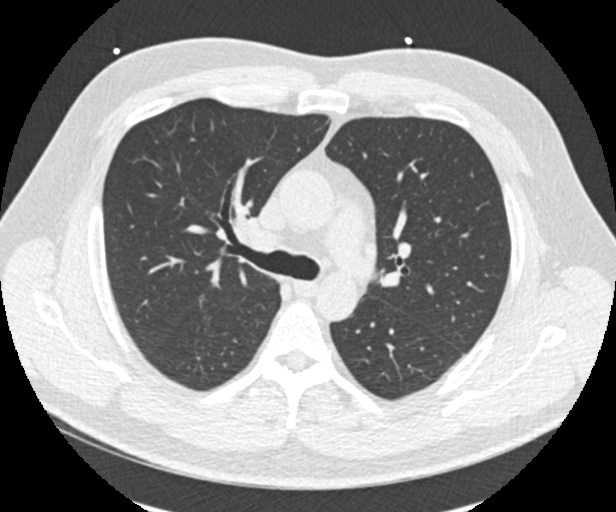

[Series 9: calcium scoring 2.00 br60 bestdiast 70% lungs · axial · 0.63mm/px · z∈[+1609,+1729]mm · 5 of 90 slices shown]
[im 15/90  vessel]
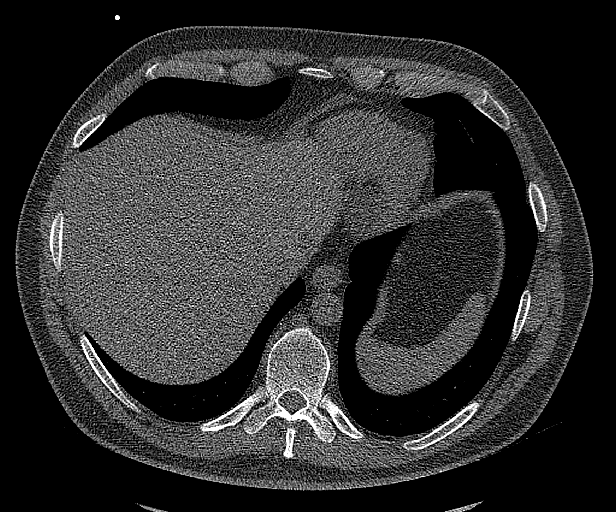
[im 30/90  vessel]
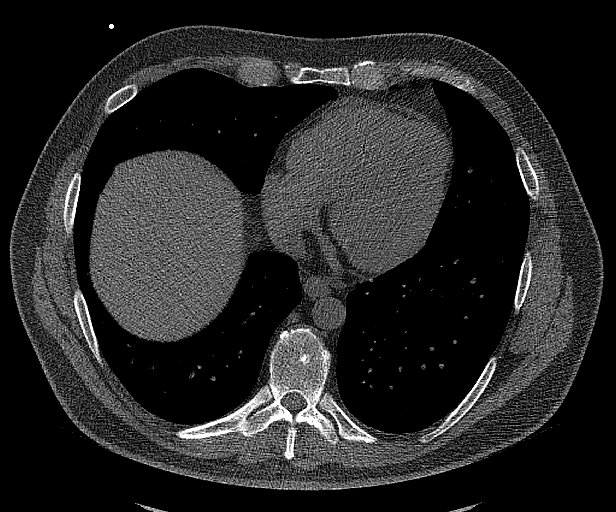
[im 45/90  vessel]
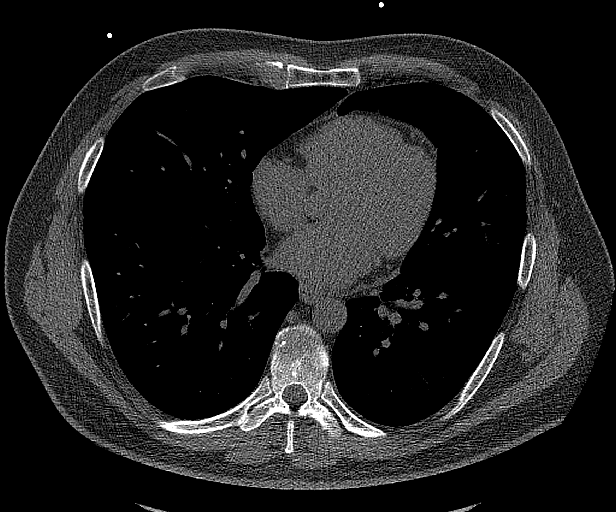
[im 60/90  vessel]
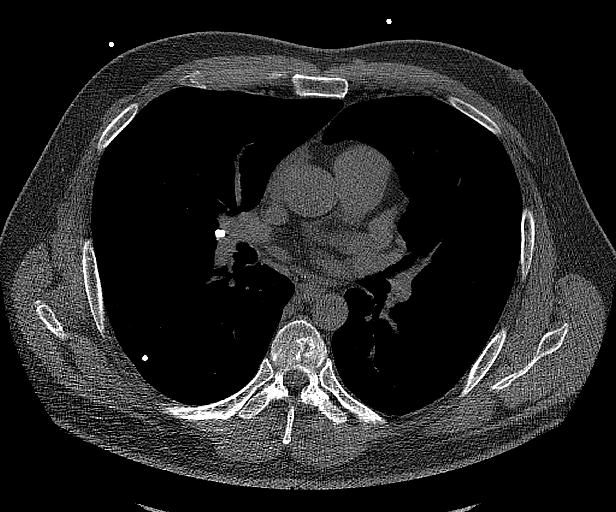
[im 75/90  vessel]
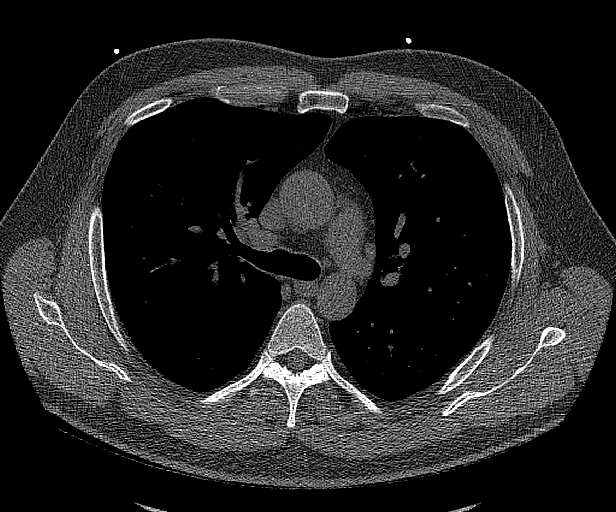

[14 of 20 positions shown; findings below may reference images not displayed]

FINDINGS: CORONARY CALCIUM SCORES:

Left Main: 0

LAD: 0

LCx: 0

RCA: 0

Total Agatston Score: 0

[HOSPITAL] percentile: 0

AORTA MEASUREMENTS:

Ascending Aorta: 34 mm

Descending Aorta: 24 mm

OTHER FINDINGS:

Heart size is normal. Minimal pericardial fluid. Right hilar
calcifications are suggestive for old granulomatous disease. No
significant mediastinal lymph node enlargement. Images of the upper
abdomen are unremarkable. 5 mm calcified granuloma in the right
lower lobe on sequence 9 image 32. Noncalcified 4 mm nodule near the
right major fissure on sequence 9 image 29. No significant airspace
disease or consolidation in lungs. Additional punctate calcified
granuloma at the right lung base on sequence 9 image 48. Degenerate
endplate changes in the lower thoracic spine.
IMPRESSION: 1. Coronary calcium score is 0.
2. Evidence for old granulomatous disease in the chest with a
noncalcified nodule in the right lung measuring 4 mm. No follow-up
needed if patient is low-risk. Non-contrast chest CT can be
considered in 12 months if patient is high-risk. This recommendation
follows the consensus statement: Guidelines for Management of
Incidental Pulmonary Nodules Detected on CT Images: From the

## 2022-06-30 ENCOUNTER — Encounter (HOSPITAL_COMMUNITY): Payer: Self-pay | Admitting: Psychiatry

## 2022-06-30 ENCOUNTER — Telehealth (HOSPITAL_BASED_OUTPATIENT_CLINIC_OR_DEPARTMENT_OTHER): Payer: BC Managed Care – PPO | Admitting: Psychiatry

## 2022-06-30 DIAGNOSIS — F419 Anxiety disorder, unspecified: Secondary | ICD-10-CM

## 2022-06-30 DIAGNOSIS — F323 Major depressive disorder, single episode, severe with psychotic features: Secondary | ICD-10-CM | POA: Diagnosis not present

## 2022-06-30 MED ORDER — ESCITALOPRAM OXALATE 10 MG PO TABS: 10.0000 mg | ORAL_TABLET | Freq: Every day | ORAL | 1 refills | Status: DC

## 2022-06-30 NOTE — Progress Notes (Signed)
Virtual Visit via Telephone Note  I connected with Jerry Thornton on 06/30/22 at  2:20 PM EDT by telephone and verified that I am speaking with the correct person using two identifiers.  Location: Patient: Home Provider: Home Office   I discussed the limitations, risks, security and privacy concerns of performing an evaluation and management service by telephone and the availability of in person appointments. I also discussed with the patient that there may be a patient responsible charge related to this service. The patient expressed understanding and agreed to proceed.   History of Present Illness: Patient is evaluated by phone session.  He had cut down his Lexapro and taking only 10 mg because 20 mg was making him too tired.  He was having issues with his daily work because he was exhausted.  Now he cut down the dose to 10 mg he is doing very well.  His energy level is improved when he is able to function very well.  He is more productive and busy at his work.  He had few trips to [place].  He denies any crying spells or any feeling of hopelessness.  He denies any suicidal thoughts, anhedonia.  His appetite is okay.  He has no tremor or shakes or any EPS.  He does exercise on a regular basis.  He has a blood work today at his local PCP but results are pending.  Patient has no major concern from his medication.  Past Psychiatric History: Reviewed. H/O inpatient in December 2017 and 1999 due to psychosis and depression. Given Wellbutrin, Depakote, Abilify, trazodone and Ativan but d/c due to sidee effects and weight gain. No history of suicidal attempt.   Psychiatric Specialty Exam: Physical Exam  Review of Systems  Weight 225 lb (102.1 kg).There is no height or weight on file to calculate BMI.  General Appearance: NA  Eye Contact:  NA  Speech:  Clear and Coherent and Normal Rate  Volume:  Normal  Mood:  Euthymic  Affect:  NA  Thought Process:  Goal Directed  Orientation:   Full (Time, Place, and Person)  Thought Content:  Logical  Suicidal Thoughts:  No  Homicidal Thoughts:  No  Memory:  Immediate;   Good Recent;   Good Remote;   Good  Judgement:  Intact  Insight:  Good  Psychomotor Activity:  NA  Concentration:  Concentration: Good and Attention Span: Good  Recall:  Good  Fund of Knowledge:  Good  Language:  Good  Akathisia:  No  Handed:  Right  AIMS (if indicated):     Assets:  Communication Skills Desire for Improvement Housing Social Support Talents/Skills Transportation  ADL's:  Intact  Cognition:  WNL  Sleep:   ok      Assessment and Plan: Major depressive disorder, recurrent.  Anxiety.  Patient has cut down his Lexapro back to 10 mg.  He is doing well and tolerating very well and reported no side effects.  His energy level is improved by reducing the dose.  He had blood work but results are pending.  Discussed medication side effects and benefits.  Recommended to call us back if is any question or any concern.  Follow-up in 6 months.  Follow Up Instructions:    I discussed the assessment and treatment plan with the patient. The patient was provided an opportunity to ask questions and all were answered. The patient agreed with the plan and demonstrated an understanding of the instructions.   The patient was advised  to call back or seek an in-person evaluation if the symptoms worsen or if the condition fails to improve as anticipated.  Collaboration of Care: Primary Care Provider AEB notes are available in epic to review.  Patient/Guardian was advised Release of Information must be obtained prior to any record release in order to collaborate their care with an outside provider. Patient/Guardian was advised if they have not already done so to contact the registration department to sign all necessary forms in order for [place] to release information regarding their care.   Consent: Patient/Guardian gives verbal consent for treatment and  assignment of benefits for services provided during this visit. Patient/Guardian expressed understanding and agreed to proceed.    I provided 18 minutes of non-face-to-face time during this encounter.   Cleotis Nipper, MD

## 2022-07-26 LAB — COLOGUARD: COLOGUARD: NEGATIVE

## 2022-09-29 ENCOUNTER — Other Ambulatory Visit: Payer: Self-pay | Admitting: Family Medicine

## 2022-09-29 DIAGNOSIS — K769 Liver disease, unspecified: Secondary | ICD-10-CM

## 2022-10-28 ENCOUNTER — Other Ambulatory Visit: Payer: BC Managed Care – PPO

## 2022-11-18 ENCOUNTER — Ambulatory Visit
Admission: RE | Admit: 2022-11-18 | Discharge: 2022-11-18 | Disposition: A | Discharge status: Home / Self Care | Payer: BC Managed Care – PPO | Source: Ambulatory Visit | Attending: Family Medicine | Admitting: Family Medicine

## 2022-11-18 DIAGNOSIS — K769 Liver disease, unspecified: Secondary | ICD-10-CM

## 2022-12-29 ENCOUNTER — Telehealth (HOSPITAL_BASED_OUTPATIENT_CLINIC_OR_DEPARTMENT_OTHER): Payer: BC Managed Care – PPO | Admitting: Psychiatry

## 2022-12-29 ENCOUNTER — Encounter (HOSPITAL_COMMUNITY): Payer: Self-pay | Admitting: Psychiatry

## 2022-12-29 DIAGNOSIS — F419 Anxiety disorder, unspecified: Secondary | ICD-10-CM

## 2022-12-29 DIAGNOSIS — F323 Major depressive disorder, single episode, severe with psychotic features: Secondary | ICD-10-CM | POA: Diagnosis not present

## 2022-12-29 MED ORDER — ESCITALOPRAM OXALATE 10 MG PO TABS: 10.0000 mg | ORAL_TABLET | Freq: Every day | ORAL | 1 refills | Status: DC

## 2022-12-29 NOTE — Progress Notes (Signed)
[name] MD Virtual Progress Note   Patient Location: In Car Provider Location: Home Office  I connect with patient by telephone and verified that I am speaking with correct person by using two identifiers. I discussed the limitations of evaluation and management by telemedicine and the availability of in person appointments. I also discussed with the patient that there may be a patient responsible charge related to this service. The patient expressed understanding and agreed to proceed.  Jerry Thornton [id number] 57 y.o.  12/29/2022 2:04 PM  History of Present Illness:  Patient is evaluated by phone session.  He is taking Lexapro 10 mg and so far it is working very well.  Denies any tremors, shakes, rash, itching.  His anxiety is under control.  He denies any crying spells or any feeling of hopelessness or worthlessness.  He has been busy at work and his family life is going well.  He noticed may have lose weight but has not checked his weight recently.  He is doing more exercise and watching his calorie intake.  He had a physical and blood work in September and everything is normal other than he has hepatic lesion and he was told to do liver ultrasound and he is waiting for schedule appointment.  Patient like to keep the current medication.  Past Psychiatric History: H/O inpatient in December 2017 and 1999 due to psychosis and depression. Given Wellbutrin, Depakote, Abilify, trazodone and Ativan but d/c due to sidee effects and weight gain. No history of suicidal attempt.    Outpatient Encounter Medications as of 12/29/2022  Medication Sig   escitalopram (LEXAPRO) 10 MG tablet Take 1 tablet (10 mg total) by mouth daily.   Omega-3 Fatty Acids (FISH OIL PO) Take 1 capsule by mouth daily. Reported on 04/26/2016   No facility-administered encounter medications on file as of 12/29/2022.    No results found for this or any previous visit (from the past 2160  hour(s)).   Psychiatric Specialty Exam: Physical Exam  Review of Systems  Weight 220 lb (99.8 kg).There is no height or weight on file to calculate BMI.  General Appearance: NA  Eye Contact:  NA  Speech:  Clear and Coherent and Normal Rate  Volume:  Normal  Mood:  Euthymic  Affect:  NA  Thought Process:  Goal Directed  Orientation:  Full (Time, Place, and Person)  Thought Content:  WDL  Suicidal Thoughts:  No  Homicidal Thoughts:  No  Memory:  Immediate;   Good Recent;   Good Remote;   Good  Judgement:  Good  Insight:  Good  Psychomotor Activity:  NA  Concentration:  Concentration: Good and Attention Span: Good  Recall:  Good  Fund of Knowledge:  Good  Language:  Good  Akathisia:  No  Handed:  Right  AIMS (if indicated):     Assets:  Communication Skills Desire for Improvement Housing Resilience Social Support Talents/Skills Transportation Vocational/Educational  ADL's:  Intact  Cognition:  WNL  Sleep:  ok     Assessment/Plan: Anxiety - Plan: escitalopram (LEXAPRO) 10 MG tablet  MDD (major depressive disorder), single episode, severe with psychotic features ([place]) - Plan: escitalopram (LEXAPRO) 10 MG tablet  Patient is stable on his current medication.  Continue Lexapro 10 mg daily.  Recommended to call us back if is any question or any concern.  Follow-up in 6 months.   Follow Up Instructions:     I discussed the assessment and treatment plan with the patient. The patient  was provided an opportunity to ask questions and all were answered. The patient agreed with the plan and demonstrated an understanding of the instructions.   The patient was advised to call back or seek an in-person evaluation if the symptoms worsen or if the condition fails to improve as anticipated.    Collaboration of Care: Other provider involved in patient's care AEB notes are available in epic to review.  Patient/Guardian was advised Release of Information must be obtained prior  to any record release in order to collaborate their care with an outside provider. Patient/Guardian was advised if they have not already done so to contact the registration department to sign all necessary forms in order for [place] to release information regarding their care.   Consent: Patient/Guardian gives verbal consent for treatment and assignment of benefits for services provided during this visit. Patient/Guardian expressed understanding and agreed to proceed.     I provided 15 minutes of non face to face time during this encounter.  Kathlee Nations, MD 12/29/2022

## 2023-01-12 ENCOUNTER — Other Ambulatory Visit (HOSPITAL_BASED_OUTPATIENT_CLINIC_OR_DEPARTMENT_OTHER): Payer: Self-pay | Admitting: Physician Assistant

## 2023-01-12 DIAGNOSIS — K769 Liver disease, unspecified: Secondary | ICD-10-CM

## 2023-02-09 ENCOUNTER — Encounter (HOSPITAL_COMMUNITY): Payer: Self-pay

## 2023-02-09 ENCOUNTER — Ambulatory Visit (HOSPITAL_COMMUNITY): Payer: BC Managed Care – PPO

## 2023-06-29 ENCOUNTER — Encounter (HOSPITAL_COMMUNITY): Payer: Self-pay | Admitting: Psychiatry

## 2023-06-29 ENCOUNTER — Telehealth (HOSPITAL_BASED_OUTPATIENT_CLINIC_OR_DEPARTMENT_OTHER): Payer: BC Managed Care – PPO | Admitting: Psychiatry

## 2023-06-29 VITALS — Wt 220.0 lb

## 2023-06-29 DIAGNOSIS — F323 Major depressive disorder, single episode, severe with psychotic features: Secondary | ICD-10-CM

## 2023-06-29 DIAGNOSIS — F419 Anxiety disorder, unspecified: Secondary | ICD-10-CM

## 2023-06-29 MED ORDER — ESCITALOPRAM OXALATE 10 MG PO TABS: 10.0000 mg | ORAL_TABLET | Freq: Every day | ORAL | 1 refills | Status: DC

## 2023-06-29 NOTE — Progress Notes (Signed)
[facility] MD Virtual Progress Note   Patient Location: Work Provider Location: Home Office  I connect with patient by video and verified that I am speaking with correct person by using two identifiers. I discussed the limitations of evaluation and management by telemedicine and the availability of in person appointments. I also discussed with the patient that there may be a patient responsible charge related to this service. The patient expressed understanding and agreed to proceed.  Jerry Thornton [id number] 57 y.o.  06/29/2023 2:27 PM  History of Present Illness:  Patient is evaluated by video session.  He is compliant with Lexapro 10 mg and is helping his depression and anxiety.  Patient reported work is busy and things are going well.  His family life is good.  He denies any anxiety, feeling of hopelessness or any depressive thoughts.  He had a good summer and he had few trips to [place].  Patient told his [place] specialist for hepatic lesion and everything is going well.  He has no tremors, shakes or any EPS.  His appetite is okay.  He sleeps good and his weight is stable.  He like to keep the current dose of Lexapro 10 mg.  Past Psychiatric History: H/O inpatient in December 2017 and 1999 due to psychosis and depression. Given Wellbutrin, Depakote, Abilify, trazodone and Ativan but d/c due to sidee effects and weight gain. No history of suicidal attempt.    Outpatient Encounter Medications as of 06/29/2023  Medication Sig   escitalopram (LEXAPRO) 10 MG tablet Take 1 tablet (10 mg total) by mouth daily.   Omega-3 Fatty Acids (FISH OIL PO) Take 1 capsule by mouth daily. Reported on 04/26/2016   No facility-administered encounter medications on file as of 06/29/2023.    No results found for this or any previous visit (from the past 2160 hour(s)).   Psychiatric Specialty Exam: Physical Exam  Review of Systems  Weight 220 lb (99.8 kg).There is no height or weight on  file to calculate BMI.  General Appearance: Casual  Eye Contact:  Good  Speech:  Clear and Coherent and Normal Rate  Volume:  Normal  Mood:  Euthymic  Affect:  Appropriate  Thought Process:  Goal Directed  Orientation:  Full (Time, Place, and Person)  Thought Content:  Logical  Suicidal Thoughts:  No  Homicidal Thoughts:  No  Memory:  Immediate;   Good Recent;   Good Remote;   Good  Judgement:  Good  Insight:  Present  Psychomotor Activity:  Normal  Concentration:  Concentration: Good and Attention Span: Good  Recall:  Good  Fund of Knowledge:  Good  Language:  Good  Akathisia:  No  Handed:  Right  AIMS (if indicated):     Assets:  Communication Skills Desire for Improvement Housing Resilience Social Support Talents/Skills Transportation  ADL's:  Intact  Cognition:  WNL  Sleep:  ok     Assessment/Plan: MDD (major depressive disorder), single episode, severe with psychotic features (H[facility]) - Plan: escitalopram (LEXAPRO) 10 MG tablet  Anxiety - Plan: escitalopram (LEXAPRO) 10 MG tablet  Patient is stable on his current medication.  Continue Lexapro 10 mg daily.  Follow up in 6 months.   Follow Up Instructions:     I discussed the assessment and treatment plan with the patient. The patient was provided an opportunity to ask questions and all were answered. The patient agreed with the plan and demonstrated an understanding of the instructions.   The patient was advised  to call back or seek an in-person evaluation if the symptoms worsen or if the condition fails to improve as anticipated.    Collaboration of Care: Other provider involved in patient's care AEB notes are available in epic to review.  Patient/Guardian was advised Release of Information must be obtained prior to any record release in order to collaborate their care with an outside provider. Patient/Guardian was advised if they have not already done so to contact the registration department to sign all  necessary forms in order for [place] to release information regarding their care.   Consent: Patient/Guardian gives verbal consent for treatment and assignment of benefits for services provided during this visit. Patient/Guardian expressed understanding and agreed to proceed.     I provided 14 minutes of non face to face time during this encounter.  Note: This document was prepared by [employer] voice dictation technology and any errors that results from this process are unintentional.    Cleotis Nipper, MD 06/29/2023

## 2023-11-03 ENCOUNTER — Telehealth (HOSPITAL_COMMUNITY): Payer: Self-pay | Admitting: Psychiatry

## 2023-11-03 NOTE — Telephone Encounter (Signed)
I returned patient's by phone call.  Apparently patient stopped taking the medication in June and started a new job in [place] area.  Patient wife was not aware that he had to stop the medication until recently.  He started to have paranoia, severe depression, insomnia and having suicidal thoughts.  [facility] in [facility] process of getting admitted at [facility].  Encourage his wife to have provider at [facility] and I am happy to provide more permission once we have consent.  She acknowledged and agreed.

## 2023-11-15 ENCOUNTER — Telehealth (HOSPITAL_COMMUNITY): Payer: Self-pay | Admitting: Psychiatry

## 2023-11-15 NOTE — Telephone Encounter (Signed)
11/15/23 Recd a call from [facility] #[id number] s/w Marylene Land (LCSW) admitted 11/03/23 - 11/16/23. Admitted Dx - manic psychosis behavior./sh

## 2023-11-15 NOTE — Telephone Encounter (Signed)
Thanks for update

## 2023-11-18 ENCOUNTER — Telehealth (HOSPITAL_BASED_OUTPATIENT_CLINIC_OR_DEPARTMENT_OTHER): Payer: Self-pay | Admitting: Psychiatry

## 2023-11-18 ENCOUNTER — Telehealth (HOSPITAL_COMMUNITY): Payer: Self-pay | Admitting: Psychiatry

## 2023-11-18 ENCOUNTER — Encounter (HOSPITAL_COMMUNITY): Payer: Self-pay | Admitting: Psychiatry

## 2023-11-18 VITALS — Wt 228.0 lb

## 2023-11-18 DIAGNOSIS — F323 Major depressive disorder, single episode, severe with psychotic features: Secondary | ICD-10-CM

## 2023-11-18 DIAGNOSIS — F419 Anxiety disorder, unspecified: Secondary | ICD-10-CM

## 2023-11-18 MED ORDER — ARIPIPRAZOLE 15 MG PO TABS: 15.0000 mg | ORAL_TABLET | Freq: Every day | ORAL | 0 refills | Status: DC

## 2023-11-18 MED ORDER — OXCARBAZEPINE 300 MG PO TABS: 300.0000 mg | ORAL_TABLET | Freq: Two times a day (BID) | ORAL | 0 refills | Status: DC

## 2023-11-18 NOTE — Progress Notes (Signed)
[facility] MD Virtual Progress Note   Patient Location: Home Provider Location: Home Office  I connect with patient by video and verified that I am speaking with correct person by using two identifiers. I discussed the limitations of evaluation and management by telemedicine and the availability of in person appointments. I also discussed with the patient that there may be a patient responsible charge related to this service. The patient expressed understanding and agreed to proceed.  Jerry Thornton [id number] 58 y.o.  11/18/2023 9:14 AM  History of Present Illness:  Patient is evaluated by video session.  He was recently discharged from [facility] where he stayed for 2 weeks after had an episode of psychosis, paranoia and suicidal thoughts.  Patient's wife called earlier and update his clinical condition.  Patient admitted that he did not informed that he had stopped taking Lexapro since June.  Patient told he was doing fine and have no issues even on the last visit we had in September.  He did not mention that he stopped taking the Lexapro.  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  Patient told he knows the staff very well and he likes his new job because it was not very stressful.  He knows his boss very well who hired him but soon he was reported to a new person and he admitted did not get along with him.  Patient told around the same time he find out that his very close friend and coworker who was 10-year younger than him died.  Patient reported having paranoia and not feeling well and his wife brought him to the hospital after he started to have suicidal thoughts and looking for the gun to shot.  Patient told that he was feeling hopeless, worthless and paranoid but would have never kill himself because he does not want his family to suffer.  Patient told his brother had committed  suicide and it was horrible for the family.  Patient admitted taking his own life is not a solution.  He wants to get better.  In the hospital Lexapro was discontinued and he was started on Abilify 15 mg and Trileptal 300 mg twice a day.  He admitted feeling tired, sluggish and slowly gradually getting used to this medication.  He denies any active or passive suicidal thoughts.  He admitted to still very paranoid and nervous but so far no hallucinations.  He had a good support from his wife.  He is slowly and gradually sleeping better and last night he slept well.  He has not back to work and he did reach out to his previous boss who is still working in [employer].  Patient told he had a support from the company and he can go back to work when he feels ready.  He has no tremors, shakes or any EPS.  He denies any aggression, violence.  He is a 56 year old and 56 year old and both live out of the town.  But this child live in [place] [facility] [place] and other lives in [place].  He denies any hopelessness or worthlessness.  He denies any mania.  He admitted some time ruminative thoughts but no nightmares.  His appetite is fair.  He gained few pounds since the last visit.  Past Psychiatric History: History of depression, psychosis.  Last inpatient at [facility] in January 2025.  History of inpatient in  December 2017 and 1999 due to psychosis and depression. Given Wellbutrin, Depakote, Abilify, trazodone and Ativan but d/c due to sidee effects and weight gain. No history of suicidal attempt.  Lexapro worked for a while until he stopped the medication on his own.   Outpatient Encounter Medications as of 11/18/2023  Medication Sig   escitalopram (LEXAPRO) 10 MG tablet Take 1 tablet (10 mg total) by mouth daily.   Omega-3 Fatty Acids (FISH OIL PO) Take 1 capsule by mouth daily. Reported on 04/26/2016   No facility-administered encounter medications on file as of 11/18/2023.    No results found for this or  any previous visit (from the past 2160 hours).   Psychiatric Specialty Exam: Physical Exam  Review of Systems  Weight 228 lb (103.4 kg).There is no height or weight on file to calculate BMI.  General Appearance: Fairly Groomed  Eye Contact:  Fair  Speech:  Slow  Volume:  Decreased  Mood:  Dysphoric  Affect:  Constricted  Thought Process:  Descriptions of Associations: Intact  Orientation:  Full (Time, Place, and Person)  Thought Content:  Paranoid Ideation and Rumination  Suicidal Thoughts:  No  Homicidal Thoughts:  No  Memory:  Immediate;   Fair Recent;   Fair Remote;   Fair  Judgement:  Intact  Insight:  Present  Psychomotor Activity:  Decreased  Concentration:  Concentration: Fair and Attention Span: Fair  Recall:  Fair  Fund of Knowledge:  Good  Language:  Good  Akathisia:  No  Handed:  Right  AIMS (if indicated):     Assets:  Communication Skills Desire for Improvement Social Support Talents/Skills  ADL's:  Intact  Cognition:  WNL  Sleep:  fair     Assessment/Plan: MDD (major depressive disorder), single episode, severe with psychotic features (HCC) - Plan: ARIPiprazole (ABILIFY) 15 MG tablet, Oxcarbazepine (TRILEPTAL) 300 MG tablet  Anxiety - Plan: ARIPiprazole (ABILIFY) 15 MG tablet  I reviewed notes from recent hospitalization, blood work results, current medication.  His UDS negative.  Discussed in detail about his stressors.  Discussed risk of decompensating due to noncompliance with medication.  Patient apologized and agree that he will be forthcoming in the future about his compliance in taking the medication unless having any side effects.  I did talk about IOP to help with coping skills and patient agreed with the plan.  We decided not to change the medication since it was just started and slowly and gradually he is adjusting the dose.  He is no longer on Lexapro.  He is taking Abilify 15 mg daily and Trileptal 300 mg twice a day.  We discussed not to go  back to work and focus to himself get better.  Patient agreed with the plan and he will discuss with his wife which he believe will agree so he can start the IOP.  I also discussed should consider continue therapy after he finished therapy.  Encouraged to call us back with any question, concern or if he feels worsening of her symptoms.  Follow-up in 3 to 4 weeks.   Follow Up Instructions:     I discussed the assessment and treatment plan with the patient. The patient was provided an opportunity to ask questions and all were answered. The patient agreed with the plan and demonstrated an understanding of the instructions.   The patient was advised to call back or seek an in-person evaluation if the symptoms worsen or if the condition fails to improve as anticipated.  Collaboration of Care: Other provider involved in patient's care AEB notes are available in epic to review  Patient/Guardian was advised Release of Information must be obtained prior to any record release in order to collaborate their care with an outside provider. Patient/Guardian was advised if they have not already done so to contact the registration department to sign all necessary forms in order for [place] to release information regarding their care.   Consent: Patient/Guardian gives verbal consent for treatment and assignment of benefits for services provided during this visit. Patient/Guardian expressed understanding and agreed to proceed.     I provided 32 minutes of non face to face time during this encounter.  Note: This document was prepared by [employer] voice dictation technology and any errors that results from this process are unintentional.    Cleotis Nipper, MD 11/18/2023

## 2023-11-21 ENCOUNTER — Telehealth (HOSPITAL_COMMUNITY): Payer: Self-pay | Admitting: Psychiatry

## 2023-11-21 NOTE — Telephone Encounter (Signed)
D:  Pt returned MH-IOP Case Mgr's call re: group.  A:  Oriented pt.  Scheduled CCA tomorrow at 9:30 a.m.  Encouraged pt to verify his benefits.  Inform Dr. Lolly Mustache.  R:  Pt receptive.

## 2023-11-22 ENCOUNTER — Other Ambulatory Visit (HOSPITAL_COMMUNITY): Payer: Self-pay | Attending: Psychiatry | Admitting: Psychiatry

## 2023-11-22 DIAGNOSIS — Z1330 Encounter for screening examination for mental health and behavioral disorders, unspecified: Secondary | ICD-10-CM

## 2023-11-22 NOTE — Progress Notes (Signed)
 Psychiatric Initial Adult Assessment  Intensive Outpatient Program  Virtual Visit via Video Note  Televisit via video: I connected with Jerry Thornton on 11/22/23 at  9:00 AM EST by a video enabled telemedicine application and verified that I am speaking with the correct person using two identifiers.  Location: Patient: home Provider: office   I discussed the limitations of evaluation and management by telemedicine and the availability of in person appointments. The patient expressed understanding and agreed to proceed.  I discussed the assessment and treatment plan with the patient. The patient was provided an opportunity to ask questions and all were answered. The patient agreed with the plan and demonstrated an understanding of the instructions.   The patient was advised to call back or seek an in-person evaluation if the symptoms worsen or if the condition fails to improve as anticipated.   Prentice Espy, MD  Patient Identification: Jerry Thornton MRN:  [id number] Date of Evaluation:  11/22/2023 Referral Source: Dr. Leni Client Chief Complaint:  Depression  Visit Diagnosis: MDD, severe w/ psychotic features  History of Present Illness:  Jerry Thornton is a 58 y.o. male with history of MDD with psychotic features presenting to [facility] as a stepdown from [facility] psychiatric hospitalization (11/03/23-11/16/23). Patient followed up with outpatient psychiatrist Dr. Client whom recommended PHP. Psychiatric hospitalization appeared to be due to SI and paranoia in context of stopping his lexapro  June 2024. Reportedly he was given the diagnosis of schizoaffective disorder and type 1 bipolar disorder at [facility] but Dr. Veto notes suggest diagnosis of MDD w/ psychotic features. Patient had been on lexapro  monotherapy for several years w/o noted psychosis. Patient was discharged from recent hospitalization on Abilify  15 mg and trileptal  300 mg bid as well as had lexapro  discontinued. In all  3 episodes, patient was not medicated with any psychotropics.   He denies feeling depressed or anhedonic prior to his psychotic episode. His psychosis are not mood congruent. He does endorse recent primary stressors of being by himself in [place] (wife was in [place] as they are moving for his job), starting a new job, and still grieving over death of a close friend who had passed from cancer. He denies sleeping or appetite have changed in the past several months  He denies experiencing a manic episode although he reports occasionally feeling euphoric.   He does endorse some symptoms of anxiety to which he will perseverate and ruminate but denies they are persistent.   He denies regularly feeling psychotic although admits to sporadically needing to reality test with wife or colleagues to ensure he's thinking the right way. He states the recent episode was abrupt and unanticipated similarly to prior episodes to which he will have sudden delusions of persecution and in this situation SI.   He denies present SI/HI/AVH. He denies ever having hallucinations more so as paranoia and delusions.  Associated Signs/Symptoms: Depression Symptoms:  denies (Hypo) Manic Symptoms:  denies Anxiety Symptoms:  Excessive Worry, Psychotic Symptoms:   denies presently PTSD Symptoms: Negative  Past Psychiatric History:  Per Dr. Veto note  History of depression, psychosis.  Last inpatient at [facility] in January 2025.  History of inpatient in December 2017 and 1999 due to psychosis and depression. Given Wellbutrin, Depakote, Abilify , trazodone  and Ativan  but d/c due to sidee effects and weight gain. No history of suicidal attempt.  Lexapro  worked for a while until he stopped the medication on his own.  Hx of violence towards others: denies Current access to guns:  denies Trauma/abuse: denies  Substance Use History: EtOH: socially about 2 alcohol drinks a month Nicotine: sporadically will smoke 1 pack  of cigarettes over one week.  Marijuana: denies IV drug use: denies Stimulants: denies Opiates: denies Sedative/hypnotics: denies Hallucinogens: denies  Past Medical History: Dx:  has a past medical history of Allergy, Anxiety, and Depression.  Head trauma: denies Seizures: denies Allergies: Patient has no known allergies.   Family Psychiatric History:  Suicide: brother completed suicide  Social History:  Housing: apartment in [place], own a house in [place] Income: attorney Marital Status: married for 27 years  Previous Psychotropic Medications: Yes   Substance Abuse History in the last 12 months:  No.  Consequences of Substance Abuse: NA  Past Medical History:  Past Medical History:  Diagnosis Date   Allergy    Anxiety    Depression    No past surgical history on file.  Family History:  Family History  Problem Relation Age of Onset   Mental illness Father    Heart disease Paternal Grandfather    Mental illness Brother    Suicidality Brother    Depression Brother     Social History:   Social History   Socioeconomic History   Marital status: Married    Spouse name: Not on file   Number of children: Not on file   Years of education: Not on file   Highest education level: Not on file  Occupational History   Not on file  Tobacco Use   Smoking status: Never   Smokeless tobacco: Never  Vaping Use   Vaping status: Never Used  Substance and Sexual Activity   Alcohol use: No    Comment: Less than 1 drink a week.    Drug use: No   Sexual activity: Yes    Partners: Female    Birth control/protection: None  Other Topics Concern   Not on file  Social History Narrative   Not on file   Social Drivers of Health   Financial Resource Strain: Low Risk  (06/28/2023)   Received from [facility]   Overall Financial Resource Strain (CARDIA)    Difficulty of Paying Living Expenses: Not hard at all  Food Insecurity: Low Risk  (11/03/2023)   Received from  [facility]   Food Insecurity    Within the past 12 months, did the food you bought just not last and you didn't have money to get more?: No    Within the past 12 months, did you worry that your food would run out before you got money to buy more?: No  Transportation Needs: Low Risk  (11/03/2023)   Received from [facility]   Transportation Needs    Within the past 12 months, has a lack of transportation kept you from medical appointments or from doing things needed for daily living?: No  Physical Activity: Sufficiently Active (06/28/2023)   Received from [facility]   Exercise Vital Sign    Days of Exercise per Week: 7 days    Minutes of Exercise per Session: 60 min  Stress: No Stress Concern Present (06/28/2023)   Received from [facility] - Occupational Stress Questionnaire    Feeling of Stress : Only a little  Social Connections: Moderately Integrated (06/28/2023)   [facility]   Social Network    How would you rate your social network (family, work, friends)?: Adequate participation with social networks   Allergies:  No Known Allergies  Metabolic Disorder Labs: Lab Results  Component Value Date   HGBA1C 5.0 10/05/2016   MPG 97 10/05/2016   Lab Results  Component Value Date   PROLACTIN 10.8 10/05/2016   Lab Results  Component Value Date   CHOL 190 10/05/2016   TRIG 108 10/05/2016   HDL 59 10/05/2016   CHOLHDL 3.2 10/05/2016   VLDL 22 10/05/2016   LDLCALC 109 (H) 10/05/2016   Lab Results  Component Value Date   TSH 0.758 10/05/2016    Therapeutic Level Labs: No results found for: LITHIUM No results found for: CBMZ No results found for: VALPROATE  Current Medications: Current Outpatient Medications  Medication Sig Dispense Refill   ARIPiprazole  (ABILIFY ) 15 MG tablet Take 1 tablet (15 mg total) by mouth daily. 30 tablet 0   cloNIDine (CATAPRES - DOSED IN MG/24 HR)  0.1 mg/24hr patch 0.1 mg once a week.     escitalopram  (LEXAPRO ) 10 MG tablet Take 1 tablet (10 mg total) by mouth daily. 90 tablet 1   Omega-3 Fatty Acids (FISH OIL PO) Take 1 capsule by mouth daily. Reported on 04/26/2016     Oxcarbazepine  (TRILEPTAL ) 300 MG tablet Take 1 tablet (300 mg total) by mouth 2 (two) times daily. 60 tablet 0   No current facility-administered medications for this visit.   VIRTUAL VISIT, LIMITED ASSESSMENT Musculoskeletal: Strength & Muscle Tone: unable to assess Gait & Station: unable to assess Patient leans: unable to assess  Psychiatric Specialty Exam: General Appearance: Casual, faily groomed  Eye Contact:  Good    Speech:  Clear, coherent, normal rate   Volume:  Normal   Mood:  good  Affect:  Appropriate, congruent, full range  Thought Content: Logical, rumination  Suicidal Thoughts: Denied active SI, denies passive SI    Thought Process:  Coherent, goal-directed, linear   Orientation:  A&Ox4   Memory:  Immediate good  Judgment:  Fair   Insight:  Fair  Concentration:  Attention and concentration good   Recall:  Good  Fund of Knowledge: Good  Language: Good, fluent  Psychomotor Activity: grossly appears normal  Akathisia:  NA   AIMS (if indicated): NA   Assets:  Communication Skills Desire for Improvement Financial Resources/Insurance Housing Intimacy Leisure Time Physical Health Resilience Social Support Talents/Skills Transportation Vocational/Educational  ADL's:  Intact  Cognition: WNL  Sleep:  good    Screenings: AUDIT    Flowsheet Row [facility]  Alcohol Use Disorder Identification Test Final Score (AUDIT) 0      PHQ2-9    Flowsheet Row Counselor from 11/22/2023 in BEHAVIORAL HEALTH INTENSIVE PSYCH Office Visit from 01/06/2018 in [facility] Care at [place] Counselor from 11/15/2016 in [facility] at [facility] Visit  from 06/24/2015 in [facility] Of [place] Office Visit from 06[facility]09/2015 in [facility] Of 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 While patient's   Brief psychotic disorder -Continue Abilify  15 mg daily -Continue Trileptal  300 mg bid   Collaboration of Care: Case was staffed with attending, see attestation per above.   Patient/Guardian was advised Release of Information must be obtained prior to any record release in order to collaborate their care with an outside provider. Patient/Guardian was advised if they have not already done so to contact the registration department to sign all necessary forms in order for us  to release information regarding their care.   Consent: Patient/Guardian gives verbal consent for treatment and assignment of benefits for services provided during this visit. Patient/Guardian expressed understanding and agreed to proceed.   Prentice Espy, MD
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
## 2023-11-23 ENCOUNTER — Other Ambulatory Visit (HOSPITAL_COMMUNITY): Payer: BC Managed Care – PPO | Admitting: Psychiatry

## 2023-11-23 ENCOUNTER — Encounter (HOSPITAL_COMMUNITY): Payer: Self-pay | Admitting: Psychiatry

## 2023-11-23 ENCOUNTER — Encounter (HOSPITAL_COMMUNITY): Payer: Self-pay

## 2023-11-23 DIAGNOSIS — Z79899 Other long term (current) drug therapy: Secondary | ICD-10-CM | POA: Insufficient documentation

## 2023-11-23 DIAGNOSIS — F332 Major depressive disorder, recurrent severe without psychotic features: Secondary | ICD-10-CM

## 2023-11-23 DIAGNOSIS — F411 Generalized anxiety disorder: Secondary | ICD-10-CM | POA: Insufficient documentation

## 2023-11-23 DIAGNOSIS — F23 Brief psychotic disorder: Secondary | ICD-10-CM

## 2023-11-23 DIAGNOSIS — F323 Major depressive disorder, single episode, severe with psychotic features: Secondary | ICD-10-CM | POA: Diagnosis present

## 2023-11-23 NOTE — Progress Notes (Signed)
 Virtual Visit via Video Note   I connected with Jerry Thornton, who prefers to go by "Jerry Thornton" on 11/23/23 at  9:00 AM EDT by a video enabled telemedicine application and verified that I am speaking with the correct person using two identifiers.   At orientation to the IOP program, Case Manager discussed the limitations of evaluation and management by telemedicine and the availability of in person appointments. The patient expressed understanding and agreed to proceed with virtual visits throughout the duration of the program.   Location:  Patient: Patient Home Provider: OPT BH Office   History of Present Illness: MDD    Observations/Objective: Check In: Case Manager checked in with all participants to review discharge dates, insurance authorizations, work-related documents and needs from the treatment team regarding medications. [name] stated needs and engaged in discussion.    Initial Therapeutic Activity: Counselor facilitated a check-in with Jerry Thornton to assess for safety, sobriety and medication compliance.  Counselor also inquired about Jerry Thornton's current emotional ratings, as well as any significant changes in thoughts, feelings or behavior since previous check in.  Jerry Thornton presented for session on time and was alert, oriented x5, with no evidence or self-report of active SI/HI or A/V H.  Jerry Thornton reported compliance with medication and denied use of alcohol or illicit substances.  Jerry Thornton reported scores of 3/10 for depression, 5/10 for anxiety, and 5/10 for anger/irritability.  Jerry Thornton denied any recent outbursts or panic attacks.  Jerry Thornton reported that a struggle was being hospitalized for 2 weeks after family became concerned for his wellbeing.  He reported that he had conversations with his wife and sister, and stated "They said I had articulated thoughts of suicide, but I don't know that this was accurate".  Jerry Thornton reported that he had thoughts of "Not wanting to exist", but no plan or intent to hurt himself at that  time.  Jerry Thornton reported that a success was scheduling a session with his wife for couples counseling following recent event and overall impact it had on the family.      Second Therapeutic Activity: Counselor introduced Buel Cedar, [facility] Pharmacist, to provide psychoeducation on topic of medication compliance with members today.  Buel provided psychoeducation on classes of medications such as antidepressants, antipsychotics, what symptoms they are intended to treat, and any side effects one might encounter while on a particular prescription.  Time was allowed for clients to ask any questions they might have of [facility] regarding this specialty.  Intervention effectiveness could not be measured, as client did not participate.     Third Therapeutic Activity:  Counselor introduced topic of mental illness stigma today.  Counselor explained how stigma is defined as someone viewing another person in a negative way due to distinguishing characteristics or traits which are thought to be a disadvantage, including negative beliefs or attitudes associated with people who have a mental health condition such as major depression and/or generalized anxiety.  Counselor explained that stigma can lead to discrimination as well, and the harmful effects of pervasive stigma include reluctance to seek help or treatment, lack of understanding by family, friends, co-workers or peers, bullying/harassment, or negative impact on one's perceived ability to overcome challenges or succeed in life.  Counselor offered several suggestions for coping with stigma regarding mental illness, including staying engaged in treatment through linkage with a therapist, psychiatrist, and/or support group, avoiding isolation, and speaking out against stigma when encountered to reverse societal impact and increase acceptance and understanding.  Counselor inquired about members' experiences with this issue,  as well as what they have done to minimize the impact it  can have on their lives.  Intervention was effective, as evidenced by [name] actively engaging in discussion on the subject, reporting that he has experienced mental health stigma, and this may have played a role in his decision to stop taking medication prior to recent hospitalization.  Jerry Thornton stated "I thought it was a good idea at the time, but it was a bad move in retrospect".  Jerry Thornton reported that he felt pressure from others to maintain the image of stability even when stress was mounting, stating "Everybody thought I was doing great".  He reported that his goal to combat stigma is to be more open and honest with his support network when he is struggling, and discuss changes in treatment with professionals rather than making impulsive decisions.  Jerry Thornton expressed interest in getting back into individual therapy upon successful discharge from MHIOP to improve accountability, stating "I think talking about my problems is helpful".         Assessment and Plan: Counselor recommends that Jerry Thornton remain in IOP treatment to better manage mental health symptoms, ensure stability and pursue completion of treatment plan goals. Counselor recommends adherence to crisis/safety plan, taking medications as prescribed, and following up with medical professionals if any issues arise.    Follow Up Instructions: Counselor will send Webex link for session tomorrow.  [name] was advised to call back or seek an in-person evaluation if the symptoms worsen or if the condition fails to improve as anticipated.   Collaboration of Care:   Medication Management AEB Staci Kerns, NP or Dr. Prentice Espy                                          Case Manager AEB Ricka Gaskins, CNA    Patient/Guardian was advised Release of Information must be obtained prior to any record release in order to collaborate their care with an outside provider. Patient/Guardian was advised if they have not already done so to contact the registration department to sign all  necessary forms in order for us  to release information regarding their care.    Consent: Patient/Guardian gives verbal consent for treatment and assignment of benefits for services provided during this visit. Patient/Guardian expressed understanding and agreed to proceed.   I provided 180 minutes of non-face-to-face time during this encounter.   [name], LCSW, LCAS 11/23/23

## 2023-11-24 ENCOUNTER — Other Ambulatory Visit (HOSPITAL_COMMUNITY): Payer: BC Managed Care – PPO | Attending: Psychiatry | Admitting: Licensed Clinical Social Worker

## 2023-11-24 DIAGNOSIS — F323 Major depressive disorder, single episode, severe with psychotic features: Secondary | ICD-10-CM | POA: Diagnosis not present

## 2023-11-24 DIAGNOSIS — F332 Major depressive disorder, recurrent severe without psychotic features: Secondary | ICD-10-CM

## 2023-11-24 NOTE — Progress Notes (Signed)
 Virtual Visit via Video Note   I connected with Jerry Thornton, who prefers to go by "Jerry Thornton" on 11/24/23 at  9:00 AM EDT by a video enabled telemedicine application and verified that I am speaking with the correct person using two identifiers.   At orientation to the IOP program, Case Manager discussed the limitations of evaluation and management by telemedicine and the availability of in person appointments. The patient expressed understanding and agreed to proceed with virtual visits throughout the duration of the program.   Location:  Patient: Patient Home Provider: Home Office   History of Present Illness: MDD    Observations/Objective: Check In: Case Manager checked in with all participants to review discharge dates, insurance authorizations, work-related documents and needs from the treatment team regarding medications. Jerry Thornton stated needs and engaged in discussion.    Initial Therapeutic Activity: Counselor facilitated a check-in with Jerry Thornton to assess for safety, sobriety and medication compliance.  Counselor also inquired about Jerry Thornton's current emotional ratings, as well as any significant changes in thoughts, feelings or behavior since previous check in.  Jerry Thornton presented for session on time and was alert, oriented x5, with no evidence or self-report of active SI/HI or A/V H.  Jerry Thornton reported compliance with medication and denied use of alcohol or illicit substances.  Jerry Thornton reported scores of 4/10 for depression, 6/10 for anxiety, and 6/10 for irritability.  Jerry Thornton denied any recent outbursts or panic attacks. Jerry Thornton reported that a success was getting several hours of paperwork done yesterday for his father yesterday.  He reported that he was also able to walk the dog a few times and do his workout routine for exercise.  Jerry Thornton reported that his goal today is to go on a hike for a few hours with his dog for more exercise, and work on repairing the fence at his home.            Second Therapeutic Activity:  Counselor introduced topic of self-care today.  Counselor explained how this can be defined as the things one does to maintain good health and improve well-being.  Counselor provided members with a self-care assessment form to complete.  This handout featured various sub-categories of self-care, including physical, psychological/emotional, social, spiritual, and professional.  Members were asked to rank their engagement in the activities listed for each dimension on a scale of 1-3, with 1 indicating 'Poor', 2 indicating 'Ok', and 3 indicating 'Well'.  Counselor invited members to share results of their assessment, and inquired about which areas of self-care they are doing well in, as well as areas that require attention, and how they plan to begin addressing this during treatment.  Intervention was effective, as evidenced by Jerry Thornton successfully completing initial 2 sections of assessment and actively engaging in discussion on subject, reporting that he is excelling in areas such as eating healthy foods, exercising, and wearing clothes that make him feel good, but would benefit from focusing more on areas such as resting when sick, getting enough sleep, talking about his problems, and taking time off from work and other obligations.  Jerry Thornton reported that he would work to improve self-care deficits by looking out for warning signs that could indicate he is getting sick and needs to take time off, implementing sleep hygiene techniques to improve average rest throughout the night, using therapy as a safe space to open up about his feelings and seek feedback, and using time off from work to focus on maintaining mental and physical wellbeing.    Assessment and  Plan: Counselor recommends that Jerry Thornton remain in IOP treatment to better manage mental health symptoms, ensure stability and pursue completion of treatment plan goals. Counselor recommends adherence to crisis/safety plan, taking medications as prescribed, and following  up with medical professionals if any issues arise.    Follow Up Instructions: Counselor will send Webex link for session tomorrow.  Jerry Thornton was advised to call back or seek an in-person evaluation if the symptoms worsen or if the condition fails to improve as anticipated.   Collaboration of Care:   Medication Management AEB Jerry Kerns, NP or Dr. Prentice Thornton                                          Case Manager AEB Jerry Gaskins, CNA    Patient/Guardian was advised Release of Information must be obtained prior to any record release in order to collaborate their care with an outside provider. Patient/Guardian was advised if they have not already done so to contact the registration department to sign all necessary forms in order for us  to release information regarding their care.    Consent: Patient/Guardian gives verbal consent for treatment and assignment of benefits for services provided during this visit. Patient/Guardian expressed understanding and agreed to proceed.   I provided 180 minutes of non-face-to-face time during this encounter.   Jerry Ricker, LCSW, LCAS 11/24/23

## 2023-11-25 ENCOUNTER — Other Ambulatory Visit (HOSPITAL_COMMUNITY): Payer: BC Managed Care – PPO | Admitting: Licensed Clinical Social Worker

## 2023-11-25 DIAGNOSIS — F332 Major depressive disorder, recurrent severe without psychotic features: Secondary | ICD-10-CM

## 2023-11-25 DIAGNOSIS — F323 Major depressive disorder, single episode, severe with psychotic features: Secondary | ICD-10-CM | POA: Diagnosis not present

## 2023-11-25 NOTE — Progress Notes (Signed)
 Virtual Visit via Video Note   I connected with Jerry Thornton, who prefers to go by "Jerry Thornton" on 11/25/23 at  9:00 AM EDT by a video enabled telemedicine application and verified that I am speaking with the correct person using two identifiers.   At orientation to the IOP program, Case Manager discussed the limitations of evaluation and management by telemedicine and the availability of in person appointments. The patient expressed understanding and agreed to proceed with virtual visits throughout the duration of the program.   Location:  Patient: Patient Home Provider: [facility] Office   History of Present Illness: MDD    Observations/Objective: Check In: Case Manager checked in with all participants to review discharge dates, insurance authorizations, work-related documents and needs from the treatment team regarding medications. Jerry Thornton stated needs and engaged in discussion.    Initial Therapeutic Activity: Counselor facilitated a check-in with Jerry Thornton to assess for safety, sobriety and medication compliance.  Counselor also inquired about Jerry Thornton's current emotional ratings, as well as any significant changes in thoughts, feelings or behavior since previous check in.  Jerry Thornton presented for session on time and was alert, oriented x5, with no evidence or self-report of active SI/HI or A/V H.  Jerry Thornton reported compliance with medication and denied use of alcohol or illicit substances.  Jerry Thornton reported scores of 5/10 for depression, 5/10 for anxiety, and 5/10 for anger/irritability.  Jerry Thornton denied any recent outbursts or panic attacks.  Jerry Thornton reported that a struggle has been dealing with lack of motivation day to day, which can be a barrier at times to accomplishing goals.  Jerry Thornton reported that a success was taking the dog for a walk and working on his fence yesterday.  Jerry Thornton reported that his goal this weekend is to spend time with his son, do some projects around the house, and possibly watch the superbowl.          Second  Therapeutic Activity: Counselor introduced topic of building a social support network today.  Counselor explained how this can be defined as having a having a group of healthy people in one's life you can talk to, spend time with, and get help from to improve both mental and physical health.  Counselor noted that some barriers can make it difficult to connect with other people, including the presence of anxiety or depression, or moving to an unfamiliar area.  Group members were asked to assess the current state of their support network, and identify ways that this could be improved.  Tips were given on how to address previously noted barriers, such as strengthening social skills, using relaxation techniques to reduce anxiety, scheduling social time each week, and/or exploring social events nearby which could increase chances of meeting new supports.  Members were also encouraged to consider getting closer to people they already know through suggestions such as outreaching someone by text, email or phone call if they haven't spoken in awhile, doing something nice for a friend/family member unexpectedly, and/or inviting someone over for a game/movie/dinner night.  Intervention was effective, as evidenced by Jerry Thornton actively participating in discussion on the subject, and reporting that the majority of his support comes from family, and he has a few good friends but they are "Geographically dispersed" and lost touch over time.  Jerry Thornton stated "I have a small, but very strong support network.  Its mostly family based".  Jerry Thornton reported that he has wanted to talk to family about what happened with his recent hospitalization, but he worries about being a '  burden' to them.  He reported that his goal will be to build self-esteem through ongoing therapy in order enhance sense of self-worth, initiate a conversation with family about recent crisis event to increase empathy and understanding, and use relaxation skills to address any  anxiety that might be a barrier to opening up or building trust.    Assessment and Plan: Counselor recommends that Jerry Thornton remain in IOP treatment to better manage mental health symptoms, ensure stability and pursue completion of treatment plan goals. Counselor recommends adherence to crisis/safety plan, taking medications as prescribed, and following up with medical professionals if any issues arise.    Follow Up Instructions: Counselor will send Webex link for session tomorrow.  Jerry Thornton was advised to call back or seek an in-person evaluation if the symptoms worsen or if the condition fails to improve as anticipated.   Collaboration of Care:   Medication Management AEB Staci Kerns, NP or Dr. Prentice Espy                                          Case Manager AEB Ricka Gaskins, CNA    Patient/Guardian was advised Release of Information must be obtained prior to any record release in order to collaborate their care with an outside provider. Patient/Guardian was advised if they have not already done so to contact the registration department to sign all necessary forms in order for us  to release information regarding their care.    Consent: Patient/Guardian gives verbal consent for treatment and assignment of benefits for services provided during this visit. Patient/Guardian expressed understanding and agreed to proceed.   I provided 180 minutes of non-face-to-face time during this encounter.   [name], LCSW, LCAS 11/25/23

## 2023-11-28 ENCOUNTER — Other Ambulatory Visit (HOSPITAL_COMMUNITY): Payer: BC Managed Care – PPO | Admitting: Psychiatry

## 2023-11-28 DIAGNOSIS — F332 Major depressive disorder, recurrent severe without psychotic features: Secondary | ICD-10-CM

## 2023-11-28 DIAGNOSIS — F323 Major depressive disorder, single episode, severe with psychotic features: Secondary | ICD-10-CM | POA: Diagnosis not present

## 2023-11-28 NOTE — Progress Notes (Signed)
Psychiatric Follow Up Adult Assessment  Intensive Outpatient Program  Virtual Visit via Video Note  Televisit via video: I connected with Jerry Thornton on 11/28/23 at  9:00 AM EST by a video enabled telemedicine application and verified that I am speaking with the correct person using two identifiers.  Location: Patient: home Provider: office   I discussed the limitations of evaluation and management by telemedicine and the availability of in person appointments. The patient expressed understanding and agreed to proceed.  I discussed the assessment and treatment plan with the patient. The patient was provided an opportunity to ask questions and all were answered. The patient agreed with the plan and demonstrated an understanding of the instructions.   The patient was advised to call back or seek an in-person evaluation if the symptoms worsen or if the condition fails to improve as anticipated.   Park Pope, MD  Patient Identification: Jerry Thornton MRN:  [id number] Date of Evaluation:  11/28/2023 Referral Source: Dr. Kathryne Sharper Chief Complaint:  Depression  Visit Diagnosis: MDD, Brief Psychotic Disorder  Interval History Patient wanted to follow up in regards to severe "heaviness" that has occurred in the evening twice in the past week 3-4 hours after taking his evening medications.    HPI:  Nox Talent is a 58 y.o. male with history of MDD with psychotic features presenting to [facility] as a stepdown from [facility] psychiatric hospitalization (11/03/23-11/16/23). Patient followed up with outpatient psychiatrist Dr. Lolly Mustache whom recommended PHP. Psychiatric hospitalization appeared to be due to SI and paranoia in context of stopping his lexapro June 2024. Reportedly he was given the diagnosis of schizoaffective disorder and type 1 bipolar disorder at [facility] but Dr. Sheela Stack notes suggest diagnosis of MDD w/ psychotic features. Patient had been on lexapro monotherapy for several  years w/o noted psychosis. Patient was discharged from recent hospitalization on Abilify 15 mg and trileptal 300 mg bid as well as had lexapro discontinued. In all 3 episodes, patient was not medicated with any psychotropics.     Associated Signs/Symptoms: Depression Symptoms:  denies (Hypo) Manic Symptoms:  denies Anxiety Symptoms:  Excessive Worry, Psychotic Symptoms:   denies presently PTSD Symptoms: Negative  Past Psychiatric History:  Per Dr. Sheela Stack note  History of depression, psychosis.  Last inpatient at [facility] in January 2025.  History of inpatient in December 2017 and 1999 due to psychosis and depression. Given Wellbutrin, Depakote, Abilify, trazodone and Ativan but d/c due to sidee effects and weight gain. No history of suicidal attempt.  Lexapro worked for a while until he stopped the medication on his own.  Hx of violence towards others: denies Current access to guns: denies Trauma/abuse: denies  Substance Use History: EtOH: socially about 2 alcohol drinks a month Nicotine: sporadically will smoke 1 pack of cigarettes over one week.  Marijuana: denies IV drug use: denies Stimulants: denies Opiates: denies Sedative/hypnotics: denies Hallucinogens: denies  Past Medical History: Dx:  has a past medical history of Allergy, Anxiety, and Depression.  Head trauma: denies Seizures: denies Allergies: Patient has no known allergies.   Family Psychiatric History:  Suicide: brother completed suicide  Social History:  Housing: apartment in [place], own a house in [place] Income: attorney Marital Status: married for 27 years  Previous Psychotropic Medications: Yes   Substance Abuse History in the last 12 months:  No.  Consequences of Substance Abuse: NA  Past Medical History:  Past Medical History:  Diagnosis Date   Allergy    Anxiety  Depression    No past surgical history on file.  Family History:  Family History  Problem Relation Age of  Onset   Depression Father    Mental illness Father    Mental illness Brother    Suicidality Brother    Depression Brother    Heart disease Paternal Grandfather     Social History:   Social History   Socioeconomic History   Marital status: Married    Spouse name: Not on file   Number of children: 2   Years of education: Not on file   Highest education level: Professional school degree (e.g., MD, DDS, DVM, JD)  Occupational History   Not on file  Tobacco Use   Smoking status: Never   Smokeless tobacco: Never  Vaping Use   Vaping status: Never Used  Substance and Sexual Activity   Alcohol use: No    Comment: Less than 1 drink a week.    Drug use: No   Sexual activity: Yes    Partners: Female    Birth control/protection: None  Other Topics Concern   Not on file  Social History Narrative   Not on file   Social Drivers of Health   Financial Resource Strain: Low Risk  (06/28/2023)   Received from [facility]   Overall Financial Resource Strain (CARDIA)    Difficulty of Paying Living Expenses: Not hard at all  Food Insecurity: Low Risk  (11/03/2023)   Received from [facility]   Food Insecurity    Within the past 12 months, did the food you bought just not last and you didn't have money to get more?: No    Within the past 12 months, did you worry that your food would run out before you got money to buy more?: No  Transportation Needs: Low Risk  (11/03/2023)   Received from [facility]   Transportation Needs    Within the past 12 months, has a lack of transportation kept you from medical appointments or from doing things needed for daily living?: No  Physical Activity: Sufficiently Active (06/28/2023)   Received from [facility]   Exercise Vital Sign    Days of Exercise per Week: 7 days    Minutes of Exercise per Session: 60 min  Stress: No Stress Concern Present (06/28/2023)   [facility] from [facility] - Occupational Stress Questionnaire    Feeling of Stress : Only a little  Social Connections: Moderately Integrated (06/28/2023)   Received from [facility]   Social Network    How would you rate your social network (family, work, friends)?: Adequate participation with social networks   Allergies:  No Known Allergies  Metabolic Disorder Labs: Lab Results  Component Value Date   HGBA1C 5.0 10/05/2016   MPG 97 10/05/2016   Lab Results  Component Value Date   PROLACTIN 10.8 10/05/2016   Lab Results  Component Value Date   CHOL 190 10/05/2016   TRIG 108 10/05/2016   HDL 59 10/05/2016   CHOLHDL 3.2 10/05/2016   VLDL 22 10/05/2016   LDLCALC 109 (H) 10/05/2016   Lab Results  Component Value Date   TSH 0.758 10/05/2016    Therapeutic Level Labs: No results found for: "LITHIUM" No results found for: "CBMZ" No results found for: "VALPROATE"  Current Medications: Current Outpatient Medications  Medication Sig Dispense Refill   ARIPiprazole (ABILIFY) 15 MG tablet Take 1 tablet (15 mg total) by mouth  daily. 30 tablet 0   cloNIDine (CATAPRES - DOSED IN MG/24 HR) 0.1 mg/24hr patch 0.1 mg once a week.     escitalopram (LEXAPRO) 10 MG tablet Take 1 tablet (10 mg total) by mouth daily. 90 tablet 1   Omega-3 Fatty Acids (FISH OIL PO) Take 1 capsule by mouth daily. Reported on 04/26/2016     Oxcarbazepine (TRILEPTAL) 300 MG tablet Take 1 tablet (300 mg total) by mouth 2 (two) times daily. 60 tablet 0   No current facility-administered medications for this visit.   VIRTUAL VISIT, LIMITED ASSESSMENT Musculoskeletal: Strength & Muscle Tone: unable to assess Gait & Station: unable to assess Patient leans: unable to assess  Psychiatric Specialty Exam: General Appearance: Casual, faily groomed  Eye Contact:  Good    Speech:  Clear, coherent, normal rate   Volume:  Normal   Mood:  "good"  Affect:  Appropriate, congruent, full range  Thought Content: Logical,  rumination  Suicidal Thoughts: Denied active SI, denies passive SI    Thought Process:  Coherent, goal-directed, linear   Orientation:  A&Ox4   Memory:  Immediate good  Judgment:  Fair   Insight:  Fair  Concentration:  Attention and concentration good   Recall:  Good  Fund of Knowledge: Good  Language: Good, fluent  Psychomotor Activity: grossly appears normal  Akathisia:  NA   AIMS (if indicated): NA   Assets:  Communication Skills Desire for Improvement Financial Resources/Insurance Housing Intimacy Leisure Time Physical Health Resilience Social Support Talents/Skills Transportation Vocational/Educational  ADL's:  Intact  Cognition: WNL  Sleep:  good    Screenings: AUDIT    Flowsheet Row [facility]  Alcohol Use Disorder Identification Test Final Score (AUDIT) 0      PHQ2-9    Flowsheet Row Counselor from 11/22/2023 in BEHAVIORAL HEALTH INTENSIVE PSYCH Office Visit from 01/06/2018 in [facility] at [place] Counselor from 11/15/2016 in [facility] Visit from 06/24/2015 in [facility] Of [place] Office Visit from 06/09/2015 in [facility] Dept Of [facility]igh[facility]  PHQ-2 Total Score 2 0 6 0 0  PHQ-9 Total Score 13 -- 25 -- --      Flowsheet Row Counselor from 11/22/2023 in BEHAVIORAL HEALTH INTENSIVE PSYCH Video [facility]  C-SSRS RISK CATEGORY Error: Q7 should not be populated when Q6 is No No Risk       Assessment and Plan:  At this time, patient's symptoms are more consistent with brief psychotic disorder than MDD, schizoaffective disorder, or bipolar disorder given the brevity and abrupt nature of these psychotic episodes. While patient's   Brief psychotic disorder -Continue Abilify 15 mg  daily -Continue Trileptal 300 mg bid   Collaboration of Care: Case was staffed with attending, see attestation per above.   Patient/Guardian was advised Release of Information must be obtained prior to any record release in order to collaborate their care with an outside provider. Patient/Guardian was advised if they have not already done so to contact the registration department to sign all necessary forms in order for [place] to release information regarding their care.   Consent: Patient/Guardian gives verbal consent for treatment and assignment of benefits for services provided during this visit. Patient/Guardian expressed understanding and agreed to proceed.   Park Pope, MD

## 2023-11-28 NOTE — Progress Notes (Signed)
 Virtual Visit via Video Note   I connected with Jerry Thornton, who prefers to go by "Jerry Thornton" on 11/28/23 at  9:00 AM EDT by a video enabled telemedicine application and verified that I am speaking with the correct person using two identifiers.   At orientation to the IOP program, Case Manager discussed the limitations of evaluation and management by telemedicine and the availability of in person appointments. The patient expressed understanding and agreed to proceed with virtual visits throughout the duration of the program.   Location:  Patient: Patient Home Provider: [facility]   History of Present Illness: MDD    Observations/Objective: Check In: Case Manager checked in with all participants to review discharge dates, insurance authorizations, work-related documents and needs from the treatment team regarding medications. [name] stated needs and engaged in discussion.    Initial Therapeutic Activity: Counselor facilitated a check-in with Jerry Thornton to assess for safety, sobriety and medication compliance.  Counselor also inquired about Jerry Thornton's current emotional ratings, as well as any significant changes in thoughts, feelings or behavior since previous check in.  Jerry Thornton presented for session on time and was alert, oriented x5, with no evidence or self-report of active SI/HI or A/V H.  Jerry Thornton reported compliance with medication and denied use of alcohol or illicit substances.  Jerry Thornton reported scores of 3/10 for depression, 3/10 for anxiety, and 2/10 for anger/irritability.  Jerry Thornton denied any recent outbursts or panic attacks.  Jerry Thornton reported that a struggle was worrying about side effects from a medication he is taking, although he was able to speak with Dr. Leia Pun today about these concerns for guidance.  Jerry Thornton reported that a success was throwing away his pack of cigarettes this weekend to aid in his goal to fully quit nicotine.  Jerry Thornton reported that his goal today is to do some paperwork, and do some swimming after  group.          Second Therapeutic Activity: Counselor introduced topic of anger management today.  Counselor virtually shared a handout with members on this subject featuring a variety of coping skills, and facilitated discussion on these approaches.  Examples included raising awareness of anger triggers, practicing deep breathing, keeping an anger log to better understand episodes, using diversion activities to distract oneself for 30 minutes, taking a time out when necessary, and being mindful of warning signs tied to thoughts or behavior.  Counselor inquired about which techniques group members have used before, what has proved to be helpful, what their unique warning signs might be, as well as what they will try out in the future to assist with de-escalation.  Intervention was effective, as evidenced by [name] participating in discussion on activity, and stating "I do think that I've got a lot of anger, but I don't express it.  It may be associated with my drive to exercise so often".  Jerry Thornton reported that his triggers include experiencing family conflict, feeling treated unfairly, embarrassment, disrespect, or ignored by others.  Jerry Thornton reported that warning signs include headaches, and sleep problems.  Jerry Thornton reported that he will work to manage anger more effectively by using coping skills such as keeping an anger journal, meditation, and deep breathing.  He reported that he has estranged relationships with some family members and will plan to maintain rigid boundaries with them to avoid conflict that could influence anger.    Assessment and Plan: Counselor recommends that Jerry Thornton remain in IOP treatment to better manage mental health symptoms, ensure stability and pursue completion of treatment  plan goals. Counselor recommends adherence to crisis/safety plan, taking medications as prescribed, and following up with medical professionals if any issues arise.    Follow Up Instructions: Counselor will send Webex  link for session tomorrow.  [name] was advised to call back or seek an in-person evaluation if the symptoms worsen or if the condition fails to improve as anticipated.   Collaboration of Care:   Medication Management AEB Dan Dun, NP or Dr. Augusta Blizzard                                          Case Manager AEB Molinda Angelica, CNA    Patient/Guardian was advised Release of Information must be obtained prior to any record release in order to collaborate their care with an outside provider. Patient/Guardian was advised if they have not already done so to contact the registration department to sign all necessary forms in order for us  to release information regarding their care.    Consent: Patient/Guardian gives verbal consent for treatment and assignment of benefits for services provided during this visit. Patient/Guardian expressed understanding and agreed to proceed.   I provided 180 minutes of non-face-to-face time during this encounter.   Desmond Florida, LCSW, LCAS 11/28/23

## 2023-11-29 ENCOUNTER — Other Ambulatory Visit (HOSPITAL_COMMUNITY): Payer: BC Managed Care – PPO | Admitting: Psychiatry

## 2023-11-29 DIAGNOSIS — F323 Major depressive disorder, single episode, severe with psychotic features: Secondary | ICD-10-CM | POA: Diagnosis not present

## 2023-11-29 DIAGNOSIS — F332 Major depressive disorder, recurrent severe without psychotic features: Secondary | ICD-10-CM

## 2023-11-29 NOTE — Progress Notes (Signed)
Virtual Visit via Video Note   I connected with Jerry Thornton, who prefers to go by "Jerry Thornton" on 11/29/23 at  9:00 AM EDT by a video enabled telemedicine application and verified that I am speaking with the correct person using two identifiers.   At orientation to the IOP program, Case Manager discussed the limitations of evaluation and management by telemedicine and the availability of in person appointments. The patient expressed understanding and agreed to proceed with virtual visits throughout the duration of the program.   Location:  Patient: Patient Home Provider: [facility]   History of Present Illness: MDD   Observations/Objective: Check In: Case Manager checked in with all participants to review discharge dates, insurance authorizations, work-related documents and needs from the treatment team regarding medications. [name] stated needs and engaged in discussion.    Initial Therapeutic Activity: Counselor facilitated a check-in with Jerry Thornton to assess for safety, sobriety and medication compliance.  Counselor also inquired about Jerry Thornton's current emotional ratings, as well as any significant changes in thoughts, feelings or behavior since previous check in.  Jerry Thornton presented for session on time and was alert, oriented x5, with no evidence or self-report of active SI/HI or A/V H.  Jerry Thornton reported compliance with medication and denied use of alcohol or illicit substances.  Jerry Thornton reported scores of 3/10 for depression, 4/10 for anxiety, and 2/10 for anger/irritability.  Jerry Thornton denied any recent outbursts or panic attacks.  Jerry Thornton reported that a success was doing some yard work, house chores, and walking his dog yesterday.  Jerry Thornton reported that a struggle has been pacing himself to avoid doing too much in his schedule each day.  Jerry Thornton reported that his goal today is to complete some paperwork after group, and swim some laps at the aquatic center nearby.          Second Therapeutic Activity: Counselor covered  topic of core beliefs with group today.  Counselor virtually shared a handout on the subject, which explained how everyone looks at the world differently, and two people can have the same experience, but have different interpretations of what happened.  Members were encouraged to think of these like sunglasses with different "shades" influencing perception towards positive or negative outcomes.  Examples of negative core beliefs were provided, such as "I'm unlovable", "I'm not good enough", and "I'm a bad person".  Members were asked to share which one(s) they could relate to, and then identify evidence which contradicts these beliefs.  Counselor also provided psychoeducation on positive affirmations today.  Counselor explained how these are positive statements which can be spoken out loud or recited mentally to challenge negative thoughts and/or core beliefs to improve mood and outlook each day.  Counselor shared a comprehensive list of affirmations virtually to members with different categories, including ones for health, confidence, success, and happiness.  Counselor invited members to look through this list and identify any which resonated with them, and practice saying them out loud with sincerity.  Intervention was effective, as evidenced by [name] successfully participating in discussion on the subject and reporting that he could relate to several negative core beliefs listed on the handout, such as "People can't be trusted", "The world is dangerous", and "I am worthless".  Jerry Thornton was able to successfully challenge the core belief "I am a bad person" by listing evidence that contradicted it, such as being a provider for his family, having an impactful role in his job, and assisting his father through recent medical challenges despite their difficult history.  Jerry Thornton also reported that he liked several of the positive affirmations listed, such as "I matter and what I have to offer this world also matters", "This too  shall pass", and "If I can think it, I can do it".      Assessment and Plan: Counselor recommends that Jerry Thornton remain in IOP treatment to better manage mental health symptoms, ensure stability and pursue completion of treatment plan goals. Counselor recommends adherence to crisis/safety plan, taking medications as prescribed, and following up with medical professionals if any issues arise.    Follow Up Instructions: Counselor will send Webex link for session tomorrow.  [name] was advised to call back or seek an in-person evaluation if the symptoms worsen or if the condition fails to improve as anticipated.   Collaboration of Care:   Medication Management AEB Hillery Jacks, NP or Dr. Park Pope                                          Case Manager AEB Jeri Modena, CNA    Patient/Guardian was advised Release of Information must be obtained prior to any record release in order to collaborate their care with an outside provider. Patient/Guardian was advised if they have not already done so to contact the registration department to sign all necessary forms in order for [place] to release information regarding their care.    Consent: Patient/Guardian gives verbal consent for treatment and assignment of benefits for services provided during this visit. Patient/Guardian expressed understanding and agreed to proceed.   I provided 180 minutes of non-face-to-face time during this encounter.   Noralee Stain, LCSW, LCAS 11/29/23

## 2023-11-30 ENCOUNTER — Other Ambulatory Visit (HOSPITAL_COMMUNITY): Payer: BC Managed Care – PPO | Admitting: Licensed Clinical Social Worker

## 2023-11-30 DIAGNOSIS — F323 Major depressive disorder, single episode, severe with psychotic features: Secondary | ICD-10-CM | POA: Diagnosis not present

## 2023-11-30 DIAGNOSIS — F332 Major depressive disorder, recurrent severe without psychotic features: Secondary | ICD-10-CM
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
## 2023-12-01 ENCOUNTER — Other Ambulatory Visit (HOSPITAL_COMMUNITY): Payer: BC Managed Care – PPO | Admitting: Licensed Clinical Social Worker

## 2023-12-01 DIAGNOSIS — F332 Major depressive disorder, recurrent severe without psychotic features: Secondary | ICD-10-CM

## 2023-12-01 DIAGNOSIS — F323 Major depressive disorder, single episode, severe with psychotic features: Secondary | ICD-10-CM | POA: Diagnosis not present

## 2023-12-01 NOTE — Progress Notes (Signed)
Virtual Visit via Video Note   I connected with Jerry Thornton, who prefers to go by "Jerry Thornton" on 12/01/23 at  9:00 AM EDT by a video enabled telemedicine application and verified that I am speaking with the correct person using two identifiers.   At orientation to the IOP program, Case Manager discussed the limitations of evaluation and management by telemedicine and the availability of in person appointments. The patient expressed understanding and agreed to proceed with virtual visits throughout the duration of the program.   Location:  Patient: Patient Home Provider: [facility]   History of Present Illness: MDD    Observations/Objective: Check In: Case Manager checked in with all participants to review discharge dates, insurance authorizations, work-related documents and needs from the treatment team regarding medications. Jerry Thornton stated needs and engaged in discussion.    Initial Therapeutic Activity: Counselor facilitated a check-in with Jerry Thornton to assess for safety, sobriety and medication compliance.  Counselor also inquired about Jerry Thornton's current emotional ratings, as well as any significant changes in thoughts, feelings or behavior since previous check in.  Jerry Thornton presented for session on time and was alert, oriented x5, with no evidence or self-report of active SI/HI or A/V H.  Jerry Thornton reported compliance with medication and denied use of alcohol or illicit substances.  Jerry Thornton reported scores of 3/10 for depression, 4/10 for anxiety, and 3/10 for irritability.  Jerry Thornton denied any recent outbursts or panic attacks.  Jerry Thornton reported that a struggle was having to drive to [facility], which was stressful due to heavy traffic.  Jerry Thornton reported that a success was getting some chores done around the house, in addition to moving some things into his [place] office.  He stated "I did try to pace myself though".  Jerry Thornton reported that his goal today is to go get some exercise by swimming at the pool.             Second Therapeutic Activity: Counselor introduced topic of stress management today.  Counselor provided definition of stress as feeling tense, overwhelmed, worn out, and/or exhausted, and noted that in small amounts, stress can be motivating until things become too overwhelming to manage.  Counselor also explained how stress can be acute (brief but intense) or chronic (long-lasting) and this can impact the severity of symptoms one can experience in the physical, emotional, and behavioral categories.  Counselor inquired about members' specific stressors, how long they have been prevalent, and the various symptoms that tend to manifest as a result.  Counselor also offered several stress management strategies to help improve members' coping ability, including journaling, gratitude practice, relaxation techniques, and time management tips.  Counselor also explained that research has shown a strong support network composed of trusted family, friends, or community members can increase resilience in times of stress, and inquired about who members can reach out to for help in managing stressors.  Counselor encouraged members to consider discussing stressor 'red flags' with their close supports that can be monitored and strategies for assisting them in times of crisis.  Intervention was effective, as evidenced by Jerry Thornton actively participating in discussion on subject, reporting that his most significant stressors include changing jobs, lack of confidence, and conflict with family.  Jerry Thornton was able to identify several warning signs related to stress, including reduced appetite, confusion, muscle tension, decreased sleep, and smoking cigarettes.  Jerry Thornton reported that his stress management goal is to work on setting healthier boundaries with his coworkers at the new job in order to reduce overall  work stress.  Jerry Thornton expressed receptiveness to several stress management strategies practiced today in session, including deep breathing,  maintaining a 'stress tracker' in his journal, and progressive muscle relaxation.     Assessment and Plan: Counselor recommends that Jerry Thornton remain in IOP treatment to better manage mental health symptoms, ensure stability and pursue completion of treatment plan goals. Counselor recommends adherence to crisis/safety plan, taking medications as prescribed, and following up with medical professionals if any issues arise.    Follow Up Instructions: Counselor will send Webex link for session tomorrow.  Jerry Thornton was advised to call back or seek an in-person evaluation if the symptoms worsen or if the condition fails to improve as anticipated.   Collaboration of Care:   Medication Management AEB Hillery Jacks, NP or Dr. Park Pope                                          Case Manager AEB Jeri Modena, CNA    Patient/Guardian was advised Release of Information must be obtained prior to any record release in order to collaborate their care with an outside provider. Patient/Guardian was advised if they have not already done so to contact the registration department to sign all necessary forms in order for [place] to release information regarding their care.    Consent: Patient/Guardian gives verbal consent for treatment and assignment of benefits for services provided during this visit. Patient/Guardian expressed understanding and agreed to proceed.   I provided 180 minutes of non-face-to-face time during this encounter.   Noralee Stain, LCSW, LCAS 12/01/23

## 2023-12-02 ENCOUNTER — Other Ambulatory Visit (HOSPITAL_COMMUNITY): Payer: BC Managed Care – PPO | Admitting: Licensed Clinical Social Worker

## 2023-12-02 DIAGNOSIS — F332 Major depressive disorder, recurrent severe without psychotic features: Secondary | ICD-10-CM

## 2023-12-02 DIAGNOSIS — F323 Major depressive disorder, single episode, severe with psychotic features: Secondary | ICD-10-CM | POA: Diagnosis not present

## 2023-12-02 NOTE — Progress Notes (Signed)
Virtual Visit via Video Note   I connected with Jerry Thornton, who prefers to go by "Jerry Thornton" on 12/02/23 at  9:00 AM EDT by a video enabled telemedicine application and verified that I am speaking with the correct person using two identifiers.   At orientation to the IOP program, Case Manager discussed the limitations of evaluation and management by telemedicine and the availability of in person appointments. The patient expressed understanding and agreed to proceed with virtual visits throughout the duration of the program.   Location:  Patient: Patient Home Provider: [facility]   History of Present Illness: MDD    Observations/Objective: Check In: Case Manager checked in with all participants to review discharge dates, insurance authorizations, work-related documents and needs from the treatment team regarding medications. Jerry Thornton stated needs and engaged in discussion.    Initial Therapeutic Activity: Counselor facilitated a check-in with Jerry Thornton to assess for safety, sobriety and medication compliance.  Counselor also inquired about Jerry Thornton's current emotional ratings, as well as any significant changes in thoughts, feelings or behavior since previous check in.  Jerry Thornton presented for session on time and was alert, oriented x5, with no evidence or self-report of active SI/HI or A/V H.  Jerry Thornton reported compliance with medication and denied use of alcohol or illicit substances.  Jerry Thornton reported scores of 2/10 for depression, 4/10 for anxiety, and 2/10 for irritability.  Jerry Thornton denied any recent outbursts or panic attacks. Jerry Thornton reported that a success was taking a walk yesterday for exercise, stating "I enjoyed myself".  Jerry Thornton reported that his goal today is to get out of the house and enjoy the warmer weather, as well as consider going to a museum over the weekend, stating "I get a lot out of that".          Second Therapeutic Activity: Counselor introduced topic of grounding skills today.  Counselor defined these  as simple strategies one can use to help detach from difficult thoughts or feelings temporarily by focusing on something else.  Counselor noted that grounding will not solve the problem at hand, but can provide the practitioner with time to regain control over their thoughts and/or feelings and prevent the situation from getting worse (i.e. interrupting a panic attack).  Counselor divided these into three categories (mental, physical, and soothing) and then provided examples of each which group members could practice during session.  Some of these included describing one's environment in detail or playing a categories game with oneself for mental category, taking a hot bath/shower, stretching, or carrying a grounding object for physical category, and saying kind statements, or visualizing people one cares about for soothing category.  Counselor inquired about which techniques members have used with success in the past, or will commit to learning, practicing, and applying now to improve coping abilities.  Intervention was effective, as evidenced by Jerry Thornton participating in discussion on the subject, trying out several of the techniques during session, and expressing interest in adding several to his available coping skills, such as describing his environment in detail, playing a categories game involving listing breeds of dogs, describing a daily activity in great detail, imagining himself relaxing somewhere outdoors that makes him feel at peace, reading poetry that is inspiring, trying to find humor in everyday life that can make him laugh, reciting the alphabet, rubbing his hands together, using his ring as a grounding object, doing a stretching routine, and reciting more kind statements to himself such as "This too shall pass".    Third Therapeutic  Activity:  Psycho-educational portion of group was provided by Virgina Evener, Interior and spatial designer of community education with [employer].  Jerry Thornton provided information  on history of her local agency, mission statement, and the variety of unique services offered which group members might find beneficial to engage in, including both virtual and in-person support groups, as well as peer support program for mentoring.  Jerry Thornton offered time to answer member's questions regarding services and encouraged them to consider utilizing these services to assist in working towards their individual wellness goals.  Intervention was effective, as evidenced by Jerry Thornton participating in discussion with speaker on the subject, reporting that he would be interested in enrollment in the various wellness classes offered to strengthen his coping skills after [facility] discharge.     Assessment and Plan: Counselor recommends that Jerry Thornton remain in IOP treatment to better manage mental health symptoms, ensure stability and pursue completion of treatment plan goals. Counselor recommends adherence to crisis/safety plan, taking medications as prescribed, and following up with medical professionals if any issues arise.    Follow Up Instructions: Counselor will send Webex link for session tomorrow.  Jerry Thornton was advised to call back or seek an in-person evaluation if the symptoms worsen or if the condition fails to improve as anticipated.   Collaboration of Care:   Medication Management AEB Hillery Jacks, NP or Dr. Park Pope                                          Case Manager AEB Jeri Modena, CNA    Patient/Guardian was advised Release of Information must be obtained prior to any record release in order to collaborate their care with an outside provider. Patient/Guardian was advised if they have not already done so to contact the registration department to sign all necessary forms in order for [place] to release information regarding their care.    Consent: Patient/Guardian gives verbal consent for treatment and assignment of benefits for services provided during this visit. Patient/Guardian expressed  understanding and agreed to proceed.   I provided 180 minutes of non-face-to-face time during this encounter.   Noralee Stain, LCSW, LCAS 12/02/23

## 2023-12-05 ENCOUNTER — Other Ambulatory Visit (HOSPITAL_COMMUNITY): Payer: BC Managed Care – PPO | Admitting: Licensed Clinical Social Worker

## 2023-12-05 DIAGNOSIS — F323 Major depressive disorder, single episode, severe with psychotic features: Secondary | ICD-10-CM | POA: Diagnosis not present

## 2023-12-05 DIAGNOSIS — F332 Major depressive disorder, recurrent severe without psychotic features: Secondary | ICD-10-CM

## 2023-12-05 NOTE — Progress Notes (Signed)
 Virtual Visit via Video Note   I connected with Jerry Thornton, who prefers to go by "Jerry Thornton" on 12/05/23 at  9:00 AM EDT by a video enabled telemedicine application and verified that I am speaking with the correct person using two identifiers.   At orientation to the IOP program, Case Manager discussed the limitations of evaluation and management by telemedicine and the availability of in person appointments. The patient expressed understanding and agreed to proceed with virtual visits throughout the duration of the program.   Location:  Patient: Patient Home Provider: Home Office   History of Present Illness: MDD    Observations/Objective: Check In: Case Manager checked in with all participants to review discharge dates, insurance authorizations, work-related documents and needs from the treatment team regarding medications. [name] stated needs and engaged in discussion.    Initial Therapeutic Activity: Counselor facilitated a check-in with Jerry Thornton to assess for safety, sobriety and medication compliance.  Counselor also inquired about Jerry Thornton's current emotional ratings, as well as any significant changes in thoughts, feelings or behavior since previous check in.  Jerry Thornton presented for session on time and was alert, oriented x5, with no evidence or self-report of active SI/HI or A/V H.  Jerry Thornton reported compliance with medication and denied use of alcohol or illicit substances.  Jerry Thornton reported scores of 2/10 for depression, 3/10 for anxiety, and 2/10 for irritability.  Jerry Thornton denied any recent outbursts or panic attacks.  Jerry Thornton reported that a struggle was feeling groggy this morning.  Jerry Thornton reported that a success was going to a museum twice over the weekend for self-care.  Jerry Thornton reported that his goal today is to visit the museum again for an hour after group, and then clean the floor at home.          Second Therapeutic Activity: Counselor engaged the group in discussion on managing work/life balance today to  improve mental health and wellness.  Counselor explained how finding balance between responsibilities at home and work place can be challenging, and lead to increased stress.  Counselor facilitated discussion on what challenges members are currently, or have historically faced.  Counselor also discussed strategies for improving work/life balance while members work on their mental health during treatment.  Some of these included keeping track of time management; creating a list of priorities and scaling importance; setting realistic, measurable goals each day; establishing boundaries; taking care of health needs; and nurturing relationships at home and work for support.  Counselor inquired about areas where members feel they are excelling, as well as areas they could focus on during treatment. 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  Jerry Thornton reported that at times, it would feel like he was never off the clock, and stated "You need to do whatever it takes to obtain results".  Jerry Thornton reported that he experienced several symptoms of burnout, including irritability, lacking motivation, poor sleep, feeling overwhelmed, decreased work performance, and withdrawing from responsibilities.  Jerry Thornton reported that there have also been numerous warning signs such as not making himself available to others and having relationship struggles, taking work home after hours, and lacking energy and motivation to engage in self-care activities outside of work hours.  [name] was receptive to suggestions offered today for addressing work life imbalance, including exercising regularly to ensure adequate stress outlet and optimum health, and keeping a running  to-do list to stay more organized and ensure adequate time for selfcare activities.  He reported that he recently began a  new job as well, and hopes this will ensure a greater balance in day to day responsibilities.  Jerry Thornton stated "I'm very hopeful that this change will result in me being more successful".    Assessment and Plan: Counselor recommends that Jerry Thornton remain in IOP treatment to better manage mental health symptoms, ensure stability and pursue completion of treatment plan goals. Counselor recommends adherence to crisis/safety plan, taking medications as prescribed, and following up with medical professionals if any issues arise.    Follow Up Instructions: Counselor will send Webex link for session tomorrow.  [name] was advised to call back or seek an in-person evaluation if the symptoms worsen or if the condition fails to improve as anticipated.   Collaboration of Care:   Medication Management AEB Hillery Jacks, NP or Dr. Park Pope                                          Case Manager AEB Jeri Modena, CNA    Patient/Guardian was advised Release of Information must be obtained prior to any record release in order to collaborate their care with an outside provider. Patient/Guardian was advised if they have not already done so to contact the registration department to sign all necessary forms in order for [place] to release information regarding their care.    Consent: Patient/Guardian gives verbal consent for treatment and assignment of benefits for services provided during this visit. Patient/Guardian expressed understanding and agreed to proceed.   I provided 180 minutes of non-face-to-face time during this encounter.   Noralee Stain, LCSW, LCAS 12/05/23

## 2023-12-06 ENCOUNTER — Other Ambulatory Visit (HOSPITAL_COMMUNITY): Payer: BC Managed Care – PPO | Admitting: Licensed Clinical Social Worker

## 2023-12-06 DIAGNOSIS — F323 Major depressive disorder, single episode, severe with psychotic features: Secondary | ICD-10-CM | POA: Diagnosis not present

## 2023-12-06 DIAGNOSIS — F332 Major depressive disorder, recurrent severe without psychotic features: Secondary | ICD-10-CM

## 2023-12-06 NOTE — Progress Notes (Signed)
 Virtual Visit via Video Note   I connected with Jerry Thornton, who prefers to go by "Jerry Thornton" on 12/06/23 at  9:00 AM EDT by a video enabled telemedicine application and verified that I am speaking with the correct person using two identifiers.   At orientation to the IOP program, Case Manager discussed the limitations of evaluation and management by telemedicine and the availability of in person appointments. The patient expressed understanding and agreed to proceed with virtual visits throughout the duration of the program.   Location:  Patient: Patient Home Provider: [facility]   History of Present Illness: MDD    Observations/Objective: Check In: Case Manager checked in with all participants to review discharge dates, insurance authorizations, work-related documents and needs from the treatment team regarding medications. [name] stated needs and engaged in discussion.    Initial Therapeutic Activity: Counselor facilitated a check-in with Jerry Thornton to assess for safety, sobriety and medication compliance.  Counselor also inquired about Jerry Thornton's current emotional ratings, as well as any significant changes in thoughts, feelings or behavior since previous check in.  Jerry Thornton presented for session on time and was alert, oriented x5, with no evidence or self-report of active SI/HI or A/V H.  Jerry Thornton reported compliance with medication and denied use of alcohol or illicit substances.  Jerry Thornton reported scores of 2/10 for depression, 3/10 for anxiety, and 2/10 for irritability.  Jerry Thornton denied any recent outbursts or panic attacks.  Jerry Thornton reported that a struggle was feeling bored after group yesterday, stating "I think that's part of adjusting to the apartment versus the house.  There is always something to do at the house".  Jerry Thornton reported that a success was getting out of the house and taking a walk around the neighborhood to get some exercise and pass the time.  Jerry Thornton reported that his goal today is to take another walk, go  for a swim, and practice his stretches after group, stating "That routine helps get my energy flowing".            Second Therapeutic Activity: Counselor proposed practice of guided imagery exercise with members today to improve their relaxation and stress management skills.  This visualization featured a casual walk through a nature trail in a forest on a pleasant fall day.  Counselor invited members to get comfortable, achieve a relaxing breathing pattern, and then began narration of this visualization, including various sensory details (i.e. feeling of the sun on the skin, smell of pine trees, sound of running stream and breeze through leaves) to enhance experience over course of activity.  Counselor processed experience afterward with each member, inquiring about effectiveness of activity, any issues that may have arisen, and whether they intend to add this to self-care routine.  Intervention was effective, as evidenced by [name] participating in activity successfully and stating "I used a place I feel very familiar with and augmented that to make it more magical.  My overall experience was very positive.  I feel like a huge amount of tension in my mind and body have been released".  He reported that he was thankful for the experience, and would include it in his self-care routine.      Third Therapeutic Activity: Counselor introduced [employer], [place] Chaplain to provide psychoeducation on topic of Grief and Loss with members today.  Marchelle Folks began discussion by checking in with the group about their baseline mood today, general thoughts on what grief means to them and how it has affected them personally in  the past.  Marchelle Folks provided information on how the process of grief/loss can differ depending upon one's unique culture, and categories of loss one could experience (i.e. loss of a person, animal, relationship, job, identity, etc).  Marchelle Folks encouraged members to be mindful of how pervasive loss can be,  and how to recognize signs which could indicate that this is having an impact on one's overall mental health and wellbeing.  Intervention was effective, as evidenced by [name] participating in discussion with speaker on the subject, reporting that he is still trying to grieve the unexpected loss of his brother.  Jerry Thornton stated "This isn't something I've successfully navigated so far".  Jerry Thornton reported that he hasn't felt like he has been able to grieve in a healthy way, and struggles with feelings of mourning and emotional pain often, especially close to the anniversary of this loss.  Jerry Thornton reported that talking about this subject reinforced the importance of him seeking a support group to help with grief after he discharges from [facility].    Assessment and Plan: Counselor recommends that Jerry Thornton remain in IOP treatment to better manage mental health symptoms, ensure stability and pursue completion of treatment plan goals. Counselor recommends adherence to crisis/safety plan, taking medications as prescribed, and following up with medical professionals if any issues arise.    Follow Up Instructions: Counselor will send Webex link for session tomorrow.  [name] was advised to call back or seek an in-person evaluation if the symptoms worsen or if the condition fails to improve as anticipated.   Collaboration of Care:   Medication Management AEB Hillery Jacks, NP or Dr. Park Pope                                          Case Manager AEB Jeri Modena, CNA    Patient/Guardian was advised Release of Information must be obtained prior to any record release in order to collaborate their care with an outside provider. Patient/Guardian was advised if they have not already done so to contact the registration department to sign all necessary forms in order for [place] to release information regarding their care.    Consent: Patient/Guardian gives verbal consent for treatment and assignment of benefits for services provided during this  visit. Patient/Guardian expressed understanding and agreed to proceed.   I provided 180 minutes of non-face-to-face time during this encounter.   Noralee Stain, LCSW, LCAS 12/06/23

## 2023-12-07 ENCOUNTER — Other Ambulatory Visit (HOSPITAL_COMMUNITY): Payer: BC Managed Care – PPO | Admitting: Psychiatry

## 2023-12-07 DIAGNOSIS — F323 Major depressive disorder, single episode, severe with psychotic features: Secondary | ICD-10-CM | POA: Diagnosis not present

## 2023-12-07 DIAGNOSIS — F332 Major depressive disorder, recurrent severe without psychotic features: Secondary | ICD-10-CM

## 2023-12-07 NOTE — Progress Notes (Signed)
 Virtual Visit via Video Note   I connected with Jerry Thornton, who prefers to go by "Jerry Thornton" on 12/07/23 at  9:00 AM EDT by a video enabled telemedicine application and verified that I am speaking with the correct person using two identifiers.   At orientation to the IOP program, Case Manager discussed the limitations of evaluation and management by telemedicine and the availability of in person appointments. The patient expressed understanding and agreed to proceed with virtual visits throughout the duration of the program.   Location:  Patient: Patient Home Provider: Home Office   History of Present Illness: MDD    Observations/Objective: Check In: Case Manager checked in with all participants to review discharge dates, insurance authorizations, work-related documents and needs from the treatment team regarding medications. Jerry Thornton stated needs and engaged in discussion.    Initial Therapeutic Activity: Counselor facilitated a check-in with Jerry Thornton to assess for safety, sobriety and medication compliance.  Counselor also inquired about Jerry Thornton's current emotional ratings, as well as any significant changes in thoughts, feelings or behavior since previous check in.  Jerry Thornton presented for session on time and was alert, oriented x5, with no evidence or self-report of active SI/HI or A/V H.  Jerry Thornton reported compliance with medication and denied use of alcohol or illicit substances.  Jerry Thornton reported scores of 3/10 for depression, 4/10 for anxiety, and 2/10 for irritability.  Jerry Thornton denied any recent outbursts or panic attacks.  Jerry Thornton reported that a struggle was feeling exhausted yesterday after group, and having to take a nap to recuperate his energy and motivation.  Jerry Thornton reported that a success was taking a walk around the neighborhood afterward for exercise, and then having dinner with a friend.  Jerry Thornton reported that his goal today is to travel back home to [place] from [place] before the snow gets too heavy.           Second Therapeutic Activity: Counselor introduced Jerry Thornton, [employer] Pharmacist, to provide psychoeducation on topic of medication compliance with members today.  Jerry Thornton provided psychoeducation on classes of medications such as antidepressants, antipsychotics, what symptoms they are intended to treat, and any side effects one might encounter while on a particular prescription.  Time was allowed for clients to ask any questions they might have of [facility] regarding this specialty.  Intervention was effective, as evidenced by Jerry Thornton participating in discussion with speaker on the subject, reporting that he is on a new medication since starting [facility], and was wondering about how the dose may be changed over time to ensure management of his symptoms.   Jerry Thornton was receptive to feedback from pharmacist on factors a provider takes into consideration during treatment to ensure proper administration of medications to improve mental health.  Jerry Thornton stated "That was unbelievably helpful for me".      Third Therapeutic Activity: Psycho-educational portion of group was co-facilitated by wellness director (David Stall, MS, MPH, CH[facility]) focused on self-care in daily life. Facilitator and group members discussed presented materials regarding importance of sleep, diet, and exercise. Group members discussed any changes they are willing to make to improve an area of self-care in their lives (physical, psychological, emotional, spiritual, relationship, professional) to improve overall mental health as they continue with treatment.  Intervention was effective, as evidenced by Jerry Thornton participating in discussion with speaker on the subject, reporting that one aspect of wellness that is important for him is exercise.  He reported that he tries to prioritize time in his routine for lifting weights, taking walks, hikes, biking,  and swimming weekly.  Jerry Thornton reported that his goal is to stay consistent with his routine and modify diet to include more  healthy choices in order to improve mental and physical wellbeing.  He stated "What I want to do is optimize and maximize my 'feel good' habits".    Assessment and Plan: Counselor recommends that Jerry Thornton remain in IOP treatment to better manage mental health symptoms, ensure stability and pursue completion of treatment plan goals. Counselor recommends adherence to crisis/safety plan, taking medications as prescribed, and following up with medical professionals if any issues arise.    Follow Up Instructions: Counselor will send Webex link for session tomorrow.  Jerry Thornton was advised to call back or seek an in-person evaluation if the symptoms worsen or if the condition fails to improve as anticipated.   Collaboration of Care:   Medication Management AEB Hillery Jacks, NP or Dr. Park Pope                                          Case Manager AEB Jeri Modena, CNA    Patient/Guardian was advised Release of Information must be obtained prior to any record release in order to collaborate their care with an outside provider. Patient/Guardian was advised if they have not already done so to contact the registration department to sign all necessary forms in order for [place] to release information regarding their care.    Consent: Patient/Guardian gives verbal consent for treatment and assignment of benefits for services provided during this visit. Patient/Guardian expressed understanding and agreed to proceed.   I provided 180 minutes of non-face-to-face time during this encounter.   Noralee Stain, LCSW, LCAS 12/07/23

## 2023-12-08 ENCOUNTER — Other Ambulatory Visit (HOSPITAL_COMMUNITY): Payer: BC Managed Care – PPO | Admitting: Psychiatry

## 2023-12-08 DIAGNOSIS — F323 Major depressive disorder, single episode, severe with psychotic features: Secondary | ICD-10-CM | POA: Diagnosis not present

## 2023-12-08 DIAGNOSIS — F332 Major depressive disorder, recurrent severe without psychotic features: Secondary | ICD-10-CM

## 2023-12-08 NOTE — Progress Notes (Signed)
 Virtual Visit via Video Note   I connected with Jerry Thornton, who prefers to go by "Jerry Thornton" on 12/08/23 at  9:00 AM EDT by a video enabled telemedicine application and verified that I am speaking with the correct person using two identifiers.   At orientation to the IOP program, Case Manager discussed the limitations of evaluation and management by telemedicine and the availability of in person appointments. The patient expressed understanding and agreed to proceed with virtual visits throughout the duration of the program.   Location:  Patient: Patient Home Provider: OPT BH Office   History of Present Illness: MDD    Observations/Objective: Check In: Case Manager checked in with all participants to review discharge dates, insurance authorizations, work-related documents and needs from the treatment team regarding medications. Jerry Thornton stated needs and engaged in discussion.    Initial Therapeutic Activity: Counselor facilitated a check-in with Jerry Thornton to assess for safety, sobriety and medication compliance.  Counselor also inquired about Jerry Thornton's current emotional ratings, as well as any significant changes in thoughts, feelings or behavior since previous check in.  Jerry Thornton presented for session on time and was alert, oriented x5, with no evidence or self-report of active SI/HI or A/V H.  Jerry Thornton reported compliance with medication and denied use of alcohol or illicit substances.  Jerry Thornton reported scores of 2/10 for depression, 2/10 for anxiety, and 2/10 for irritability.  Jerry Thornton denied any recent outbursts or panic attacks.  Jerry Thornton reported that a success was spending much of yesterday sorting and packing.  Jerry Thornton reported that a struggle was coming across some items in the house that reminded him of family members that have passed, which brought up feelings of grief.  He reported that he also took the dog for a walk.  Jerry Thornton reported that his goal today is to continue working on sorting and packing around the home since  its snowing outside.            Second Therapeutic Activity: Counselor introduced topic of assertive communication today.  Counselor shared various handouts with members virtually in group to read along with on the subject.  These handouts defined assertive communication as a communication style in which a person stands up for their own needs and wants, while also taking into consideration the needs and wants of others, without behaving in a passive or aggressive way.  Traits of assertive communicators were highlighted such as using appropriate speaking volume, maintaining eye contact, using confident language, and avoiding interruption.  Members were also provided with tips on how to improve communication, including respecting oneself, expressing thoughts and feelings calmly, and saying "No" when necessary.  Members were given a variety of scenarios where they could practice using these tips to respond in an assertive manner.  Intervention was effective, as evidenced by Jerry Thornton participating in discussion on topic, reporting that he has a passive communication style due to traits such as being soft spoken or quiet, having poor eye contact, allowing others to take advantage, and prioritizing the needs of others.  Jerry Thornton reported that he can be more assertive in the workplace since he feels like his job 'demands' it, but this has led to resentment in his personal life when he feels like people have been asking too much from him for too long.  Jerry Thornton showed more effective use of assertive communication skills through engagement in roleplay activities.  Assessment and Plan: Counselor recommends that Jerry Thornton remain in IOP treatment to better manage mental health symptoms, ensure stability and pursue completion  of treatment plan goals. Counselor recommends adherence to crisis/safety plan, taking medications as prescribed, and following up with medical professionals if any issues arise.    Follow Up Instructions: Counselor  will send Webex link for session tomorrow.  Jerry Thornton was advised to call back or seek an in-person evaluation if the symptoms worsen or if the condition fails to improve as anticipated.   Collaboration of Care:   Medication Management AEB Hillery Jacks, NP or Dr. Park Pope                                          Case Manager AEB Jeri Modena, CNA    Patient/Guardian was advised Release of Information must be obtained prior to any record release in order to collaborate their care with an outside provider. Patient/Guardian was advised if they have not already done so to contact the registration department to sign all necessary forms in order for [place] to release information regarding their care.    Consent: Patient/Guardian gives verbal consent for treatment and assignment of benefits for services provided during this visit. Patient/Guardian expressed understanding and agreed to proceed.   I provided 180 minutes of non-face-to-face time during this encounter.   Noralee Stain, LCSW, LCAS 12/08/23

## 2023-12-09 ENCOUNTER — Other Ambulatory Visit (HOSPITAL_COMMUNITY): Payer: BC Managed Care – PPO | Admitting: Psychiatry

## 2023-12-09 DIAGNOSIS — F323 Major depressive disorder, single episode, severe with psychotic features: Secondary | ICD-10-CM | POA: Diagnosis not present

## 2023-12-09 NOTE — Progress Notes (Signed)
 Virtual Visit via Video Note  I connected with Jerry Thornton on 12/09/23 at  9:00 AM EST by a video enabled telemedicine application and verified that I am speaking with the correct person using two identifiers.  Location: Patient: Home  Provider: Office    I discussed the limitations of evaluation and management by telemedicine and the availability of in person appointments. The patient expressed understanding and agreed to proceed.     I discussed the assessment and treatment plan with the patient. The patient was provided an opportunity to ask questions and all were answered. The patient agreed with the plan and demonstrated an understanding of the instructions.   The patient was advised to call back or seek an in-person evaluation if the symptoms worsen or if the condition fails to improve as anticipated.  I provided 15 minutes of non-face-to-face time during this encounter.   [name], NP    [name], NP   [facility] MD/PA/NP OP Progress Note  12/09/2023 9:12 AM Zaiah Eckerson  MRN:  [id number]  Chief Complaint: Medication side effects  HPI: Ariez was evaluated via telephonically. Carries a diagnosis with Major depressive disorder and schizoaffective disorder currently prescribed Abilify 15 mg and  Trileptal 300 mg twice a day he reports grogginess, headaches, tiredness after taking his afternoon dose of Trileptal 300 mg.  Patient is requesting to discontinue afternoon dose of Trileptal.  Will continue to monitor symptoms.  Discussed consideration to taper down Trileptal 300 mg to 150 mg twice daily.  Patient opted to continue Abilify 15 mg daily and Trileptal 300 mg in the morning.  Discontinue afternoon dose.  Will continue to monitor symptoms.  Support, and encouragement reassurance was provided.   Visit Diagnosis: Schizoaffective disorder, major depressive disorder with psychotic features, generalized anxiety disorder  Past Psychiatric History:   Past  Medical History:  Past Medical History:  Diagnosis Date   Allergy    Anxiety    Depression    No past surgical history on file.  Family Psychiatric History:   Family History:  Family History  Problem Relation Age of Onset   Depression Father    Mental illness Father    Mental illness Brother    Suicidality Brother    Depression Brother    Heart disease Paternal Grandfather     Social History:  Social History   Socioeconomic History   Marital status: Married    Spouse name: Not on file   Number of children: 2   Years of education: Not on file   Highest education level: Professional school degree (e.g., MD, DDS, DVM, JD)  Occupational History   Not on file  Tobacco Use   Smoking status: Never   Smokeless tobacco: Never  Vaping Use   Vaping status: Never Used  Substance and Sexual Activity   Alcohol use: No    Comment: Less than 1 drink a week.    Drug use: No   Sexual activity: Yes    Partners: Female    Birth control/protection: None  Other Topics Concern   Not on file  Social History Narrative   Not on file   Social Drivers of Health   Financial Resource Strain: Low Risk  (06/28/2023)   Received from [facility]   Overall Financial Resource Strain (CARDIA)    Difficulty of Paying Living Expenses: Not hard at all  Food Insecurity: Low Risk  (11/03/2023)   Received from [facility]   Food Insecurity  Within the past 12 months, did the food you bought just not last and you didn't have money to get more?: No    Within the past 12 months, did you worry that your food would run out before you got money to buy more?: No  Transportation Needs: Low Risk  (11/03/2023)   Received from [facility]   Transportation Needs    Within the past 12 months, has a lack of transportation kept you from medical appointments or from doing things needed for daily living?: No  Physical Activity: Sufficiently Active (06/28/2023)   Received from  [facility]   Exercise Vital Sign    Days of Exercise per Week: 7 days    Minutes of Exercise per Session: 60 min  Stress: No Stress Concern Present (06/28/2023)   Received from [facility] - Occupational Stress Questionnaire    Feeling of Stress : Only a little  Social Connections: Moderately Integrated (06/28/2023)   Received from [facility]   Social Network    How would you rate your social network (family, work, friends)?: Adequate participation with social networks    Allergies: No Known Allergies  Metabolic Disorder Labs: Lab Results  Component Value Date   HGBA1C 5.0 10/05/2016   MPG 97 10/05/2016   Lab Results  Component Value Date   PROLACTIN 10.8 10/05/2016   Lab Results  Component Value Date   CHOL 190 10/05/2016   TRIG 108 10/05/2016   HDL 59 10/05/2016   CHOLHDL 3.2 10/05/2016   VLDL 22 10/05/2016   LDLCALC 109 (H) 10/05/2016   Lab Results  Component Value Date   TSH 0.758 10/05/2016    Therapeutic Level Labs: No results found for: "LITHIUM" No results found for: "VALPROATE" No results found for: "CBMZ"  Current Medications: Current Outpatient Medications  Medication Sig Dispense Refill   ARIPiprazole (ABILIFY) 15 MG tablet Take 1 tablet (15 mg total) by mouth daily. 30 tablet 0   cloNIDine (CATAPRES - DOSED IN MG/24 HR) 0.1 mg/24hr patch 0.1 mg once a week.     escitalopram (LEXAPRO) 10 MG tablet Take 1 tablet (10 mg total) by mouth daily. 90 tablet 1   Omega-3 Fatty Acids (FISH OIL PO) Take 1 capsule by mouth daily. Reported on 04/26/2016     Oxcarbazepine (TRILEPTAL) 300 MG tablet Take 1 tablet (300 mg total) by mouth 2 (two) times daily. 60 tablet 0   No current facility-administered medications for this visit.     Musculoskeletal: Futures trader   Psychiatric Specialty Exam: Review of Systems  There were no vitals taken for this visit.There is no height or weight on file to  calculate BMI.  General Appearance: Casual  Eye Contact:  Good  Speech:  Clear and Coherent  Volume:  Normal  Mood:  Anxious and Depressed  Affect:  Congruent  Thought Process:  Coherent  Orientation:  Full (Time, Place, and Person)  Thought Content: Logical   Suicidal Thoughts:  No  Homicidal Thoughts:  No  Memory:  Immediate;   Good Recent;   Good  Judgement:  Good  Insight:  Good  Psychomotor Activity:  Normal  Concentration:  Concentration: Good  Recall:  Good  Fund of Knowledge: Good  Language: Good  Akathisia:  No  Handed:  Right  AIMS (if indicated): done  Assets:  Communication Skills Desire for Improvement Resilience Social Support  ADL's:  Intact  Cognition: WNL  Sleep:  Good  Screenings: AUDIT    Flowsheet Row [facility]  Alcohol Use Disorder Identification Test Final Score (AUDIT) 0      PHQ2-9    Flowsheet Row Counselor from 11/22/2023 in [facility]AL HEALTH INTENSIVE PSYCH Office Visit from 01/06/2018 in [facility] Care at [facility] from 11/15/2016 in [facility] Visit from 06/24/2015 in [facility] - [facility] Dept Of [place] Office Visit from 06/09/2015 in [facility] - A Dept Of Eligha Bridegroom. [facility]  PHQ-2 Total Score 2 0 6 0 0  PHQ-9 Total Score 13 -- 25 -- --      Flowsheet Row Counselor from 11/22/2023 in BEHAVIORAL HEALTH INTENSIVE PSYCH Video [facility]  C-SSRS RISK CATEGORY Error: Q7 should not be populated when Q6 is No No Risk        Assessment and Plan:  Continue intensive outpatient programming Continue Abilify 15 mg daily Continue Trileptal 300 mg daily Discontinue Trileptal 300 afternoon dose  Collaboration of Care: Collaboration of Care: Medication Management AEB will continue  to follow-up with symptoms  Patient/Guardian was advised Release of Information must be obtained prior to any record release in order to collaborate their care with an outside provider. Patient/Guardian was advised if they have not already done so to contact the registration department to sign all necessary forms in order for [place] to release information regarding their care.   Consent: Patient/Guardian gives verbal consent for treatment and assignment of benefits for services provided during this visit. Patient/Guardian expressed understanding and agreed to proceed.    [name], NP 12/09/2023, 9:12 AM

## 2023-12-09 NOTE — Progress Notes (Signed)
 Virtual Visit via Video Note   I connected with Jerry Thornton, who prefers to go by "Jerry Thornton" on 12/09/23 at  9:00 AM EDT by a video enabled telemedicine application and verified that I am speaking with the correct person using two identifiers.   At orientation to the IOP program, Case Manager discussed the limitations of evaluation and management by telemedicine and the availability of in person appointments. The patient expressed understanding and agreed to proceed with virtual visits throughout the duration of the program.   Location:  Patient: Patient Home Provider: Home Office   History of Present Illness: MDD    Observations/Objective: Check In: Case Manager checked in with all participants to review discharge dates, insurance authorizations, work-related documents and needs from the treatment team regarding medications. Jerry Thornton stated needs and engaged in discussion.    Initial Therapeutic Activity: Counselor facilitated a check-in with Jerry Thornton to assess for safety, sobriety and medication compliance.  Counselor also inquired about Jerry Thornton's current emotional ratings, as well as any significant changes in thoughts, feelings or behavior since previous check in.  Jerry Thornton presented for session on time and was alert, oriented x5, with no evidence or self-report of active SI/HI or A/V H.  Jerry Thornton reported compliance with medication and denied use of alcohol or illicit substances.  Jerry Thornton reported scores of 2/10 for depression, 2/10 for anxiety, and 2/10 for irritability.  Jerry Thornton denied any recent outbursts or panic attacks.  Jerry Thornton reported that a struggle was worrying about side effects he attributed to his medication, but receiving feedback from nurse practitioner that put his mind at ease.  Jerry Thornton reported that a success was walking the dog for exercise and doing some organizing around the home yesterday.  Jerry Thornton reported that his goal this weekend is to spend time with his son, who will be visiting.            Second  Therapeutic Activity: Counselor introduced topic of mental illness stigma today.  Counselor explained how stigma is defined as someone viewing another person in a negative way due to distinguishing characteristics or traits which are thought to be a disadvantage, including negative beliefs or attitudes associated with people who have a mental health condition such as major depression and/or generalized anxiety.  Counselor explained that stigma can lead to discrimination as well, and the harmful effects of pervasive stigma include reluctance to seek help or treatment, lack of understanding by family, friends, co-workers or peers, bullying/harassment, or negative impact on one's perceived ability to overcome challenges or succeed in life.  Counselor offered several suggestions for coping with stigma regarding mental illness, including staying engaged in treatment through linkage with a therapist, psychiatrist, and/or support group, avoiding isolation, and speaking out against stigma when encountered to reverse societal impact and increase acceptance and understanding.  Counselor inquired about members' experiences with this issue, as well as what they have done to minimize the impact it can have on their lives.  Intervention was effective, as evidenced by Jerry Thornton actively engaging in discussion on the subject, reporting that he is concerned about how to approach the topic of mental health at his new job when he returns.  Jerry Thornton reported that he is trying to be more open and honest about his struggles, especially due to the tragic loss of his brother.  Jerry Thornton reported that he was appreciative of the feedback from counselor and peers on this subject, as it reinforced the fact that he can adjust his boundaries with some people and not feel stigma  as he did at some points in the past.  Jerry Thornton reported that he would plan to seek out additional support groups after discharge from [facility] in order to ensure support from like minded  peers about mental health.    Assessment and Plan: Counselor recommends that Jerry Thornton remain in IOP treatment to better manage mental health symptoms, ensure stability and pursue completion of treatment plan goals. Counselor recommends adherence to crisis/safety plan, taking medications as prescribed, and following up with medical professionals if any issues arise.    Follow Up Instructions: Counselor will send Webex link for session tomorrow.  Jerry Thornton was advised to call back or seek an in-person evaluation if the symptoms worsen or if the condition fails to improve as anticipated.   Collaboration of Care:   Medication Management AEB Hillery Jacks, NP or Dr. Park Pope                                          Case Manager AEB Jeri Modena, CNA    Patient/Guardian was advised Release of Information must be obtained prior to any record release in order to collaborate their care with an outside provider. Patient/Guardian was advised if they have not already done so to contact the registration department to sign all necessary forms in order for [place] to release information regarding their care.    Consent: Patient/Guardian gives verbal consent for treatment and assignment of benefits for services provided during this visit. Patient/Guardian expressed understanding and agreed to proceed.   I provided 180 minutes of non-face-to-face time during this encounter.   Noralee Stain, LCSW, LCAS 12/09/23

## 2023-12-12 ENCOUNTER — Other Ambulatory Visit (HOSPITAL_COMMUNITY): Payer: BC Managed Care – PPO | Admitting: Psychiatry

## 2023-12-12 DIAGNOSIS — F332 Major depressive disorder, recurrent severe without psychotic features: Secondary | ICD-10-CM

## 2023-12-12 DIAGNOSIS — F323 Major depressive disorder, single episode, severe with psychotic features: Secondary | ICD-10-CM | POA: Diagnosis not present

## 2023-12-12 NOTE — Patient Instructions (Addendum)
 D:  Patient will successfully complete virtual MH-IOP tomorrow (12-13-23).  A:  Discharge on 12-13-23.  Follow up with Dr. Lolly Mustache on 12-22-23 @ 2:40 pm (in person) and Alvera Singh, LCSW on  12-21-23 @ 12:30 pm.           .  Strongly recommend support groups through [employer] [facility] [employer].  Return to work on 12-26-23; without any restrictions.  R: Patient receptive.

## 2023-12-12 NOTE — Progress Notes (Signed)
 Virtual Visit via Video Note   I connected with Jerry Thornton, who prefers to go by "Jerry Thornton" on 12/12/23 at  9:00 AM EDT by a video enabled telemedicine application and verified that I am speaking with the correct person using two identifiers.   At orientation to the IOP program, Case Manager discussed the limitations of evaluation and management by telemedicine and the availability of in person appointments. The patient expressed understanding and agreed to proceed with virtual visits throughout the duration of the program.   Location:  Patient: Patient Home Provider: OPT BH Office   History of Present Illness: MDD    Observations/Objective: Check In: Case Manager checked in with all participants to review discharge dates, insurance authorizations, work-related documents and needs from the treatment team regarding medications. Jerry Thornton stated needs and engaged in discussion.    Initial Therapeutic Activity: Counselor facilitated a check-in with Jerry Thornton to assess for safety, sobriety and medication compliance.  Counselor also inquired about Jerry Thornton's current emotional ratings, as well as any significant changes in thoughts, feelings or behavior since previous check in.  Jerry Thornton presented for session on time and was alert, oriented x5, with no evidence or self-report of active SI/HI or A/V H.  Jerry Thornton reported compliance with medication and denied use of alcohol or illicit substances.  Jerry Thornton reported scores of 3/10 for depression, 2/10 for anxiety, and 2/10 for irritability.  Jerry Thornton denied any recent outbursts or panic attacks.  Jerry Thornton reported that a struggle was buying a pack of cigarettes yesterday after a week of abstaining, noting that he felt like this decision was fueled by the anniversary of his brother's passing.  Jerry Thornton reported that a success was spending quality time with his son over the weekend, stating "We are going to do some kayaking together later this week".  He reported that he was also able to get some  housework done.  Jerry Thornton reported that his goal today is to do some yardwork, and then take a walk with his dog through the woods.          Second Therapeutic Activity: Counselor discussed topic of sleep hygiene today with group.  Counselor defined this as the habits, behaviors and environmental factors that can be adjusted to improve overall sleep quality.  Counselor also discussed how lack of consistent sleep can negatively affect mood and overall mental health.  Counselor provided members with a screening to complete assessing typical barriers one might face in achieving quality sleep at night (i.e. struggling to get up in the morning, waking up throughout the night, tossing/turning, etc), and inquired about members' perception of sleep hygiene at present.  Counselor offered techniques to members via a virtual handout which could be implemented in order to improve sleep hygiene, such as sticking to a normal morning/night routine, avoiding using of electronics too close to bedtime, avoiding heavy meals before bed, using a sleep journal to track changes and address anxious thoughts, as well as avoiding naps during the daytime, ensuring proper use of medications if prescribed any by provider(s), and more.  Counselor inquired about changes members intend to make to sleep hygiene based upon information covered today.  Interventions were effective, as evidenced by Jerry Thornton actively participating in discussion on subject, reporting that prior to his recent mental health crisis, he was able to consistently get around 8 hours of rest each night, but since going on leave, his schedule is erratic, and could use improvement.  Jerry Thornton reported that he tends to sleep from 8pm until 11pm, wake  up and do things for awhile, then go back to sleep from 1am until 4am.  Jerry Thornton also completed a sleep assessment, which revealed that he is getting a 'adequate' level of sleep at present.  Jerry Thornton reported that he plans to improve sleep hygiene over  the course of treatment by getting some exercise each day in order to make sure that he is tired at bedtime, keeping a consistent sleep/wake routine, avoiding naps during the daytime, and avoiding use of nicotine or coffee too close to bedtime.    Assessment and Plan: Counselor recommends that Jerry Thornton remain in IOP treatment to better manage mental health symptoms, ensure stability and pursue completion of treatment plan goals. Counselor recommends adherence to crisis/safety plan, taking medications as prescribed, and following up with medical professionals if any issues arise.    Follow Up Instructions: Counselor will send Webex link for session tomorrow.  Jerry Thornton was advised to call back or seek an in-person evaluation if the symptoms worsen or if the condition fails to improve as anticipated.   Collaboration of Care:   Medication Management AEB Hillery Jacks, NP or Dr. Park Pope                                          Case Manager AEB Jeri Modena, CNA    Patient/Guardian was advised Release of Information must be obtained prior to any record release in order to collaborate their care with an outside provider. Patient/Guardian was advised if they have not already done so to contact the registration department to sign all necessary forms in order for [place] to release information regarding their care.    Consent: Patient/Guardian gives verbal consent for treatment and assignment of benefits for services provided during this visit. Patient/Guardian expressed understanding and agreed to proceed.   I provided 180 minutes of non-face-to-face time during this encounter.   Noralee Stain, LCSW, LCAS 12/12/23

## 2023-12-13 ENCOUNTER — Other Ambulatory Visit (HOSPITAL_COMMUNITY): Payer: BC Managed Care – PPO | Admitting: Psychiatry

## 2023-12-13 ENCOUNTER — Encounter (HOSPITAL_COMMUNITY): Payer: Self-pay | Admitting: Psychiatry

## 2023-12-13 DIAGNOSIS — F419 Anxiety disorder, unspecified: Secondary | ICD-10-CM

## 2023-12-13 DIAGNOSIS — F332 Major depressive disorder, recurrent severe without psychotic features: Secondary | ICD-10-CM | POA: Diagnosis not present

## 2023-12-13 DIAGNOSIS — F323 Major depressive disorder, single episode, severe with psychotic features: Secondary | ICD-10-CM

## 2023-12-13 MED ORDER — ARIPIPRAZOLE 15 MG PO TABS: 15.0000 mg | ORAL_TABLET | Freq: Every day | ORAL | 0 refills | Status: DC

## 2023-12-13 MED ORDER — OXCARBAZEPINE 300 MG PO TABS: 300.0000 mg | ORAL_TABLET | Freq: Every day | ORAL | 0 refills | Status: DC

## 2023-12-13 NOTE — Progress Notes (Signed)
 Virtual Visit via Telephone Note  I connected with Jerry Thornton on 12/13/23 at  9:00 AM EST by telephone and verified that I am speaking with the correct person using two identifiers.  Location: Patient: Home Provider: Office    I discussed the limitations, risks, security and privacy concerns of performing an evaluation and management service by telephone and the availability of in person appointments. I also discussed with the patient that there may be a patient responsible charge related to this service. The patient expressed understanding and agreed to proceed.    I discussed the assessment and treatment plan with the patient. The patient was provided an opportunity to ask questions and all were answered. The patient agreed with the plan and demonstrated an understanding of the instructions.   The patient was advised to call back or seek an in-person evaluation if the symptoms worsen or if the condition fails to improve as anticipated.  I provided 15 minutes of non-face-to-face time during this encounter.   Jerry Rack, NP     [facility] Intensive Outpatient Program Discharge Summary  Jerry Thornton [id number]  Admission date: 11/28/2023 Discharge date: 12/13/2023  Reason for admission: Per admission assessment note: "Jerry Thornton is a 58 y.o. male with history of MDD with psychotic features presenting to [facility] as a stepdown from [facility] psychiatric hospitalization (11/03/23-11/16/23). Patient followed up with outpatient psychiatrist Jerry Thornton whom recommended PHP. Psychiatric hospitalization appeared to be due to SI and paranoia in context of stopping his lexapro June 2024. Reportedly he was given the diagnosis of schizoaffective disorder and type 1 bipolar disorder at [facility] but Jerry Thornton notes suggest diagnosis of MDD w/ psychotic features."    Progress in Program Toward Treatment Goals: Progressing patient attended and participated with  daily group session with active and engaged participation.  Denying suicidal or homicidal ideations at discharge.  Medication adjustments have been made while attending intensive outpatient programming.  Patient is to continue Abilify 15 mg daily and decrease Trileptal from 300 mg twice daily to Trileptal 300 milligrams daily.  He reports he has noted great improvement with his symptoms.  No other documented concerns at discharge.  Medications was refilled.  Patient to keep all outpatient follow-up appointments with psychiatrist.   Progress (rationale): Take all of you medications as prescribed by your mental healthcare provider.  Report any adverse effects and reactions from your medications to your outpatient provider promptly.  Do not engage in alcohol and or illegal drug use while on prescription medicines. Keep all scheduled appointments. This is to ensure that you are getting refills on time and to avoid any interruption in your medication.  If you are unable to keep an appointment call to reschedule.  Be sure to follow up with resources and follow ups given. In the event of worsening symptoms call the crisis hotline, 911, and or go to the nearest emergency department for appropriate evaluation and treatment of symptoms. Follow-up with your primary care provider for your medical issues, concerns and or health care needs.  Collaboration of Care: Medication Management AEB Abilify Trileptal was refilled at discharge.  Medication adjustments  Continue Abilify 15 mg daily Continue Trileptal 300 mg daily Discontinue Trileptal 300 afternoon dose  Patient/Guardian was advised Release of Information must be obtained prior to any record release in order to collaborate their care with an outside provider. Patient/Guardian was advised if they have not already done so to contact the registration department to sign all necessary forms in  order for [place] to release information regarding their care.   Consent:  Patient/Guardian gives verbal consent for treatment and assignment of benefits for services provided during this visit. Patient/Guardian expressed understanding and agreed to proceed.   Jerry Thornton, Jerry Thornton 12/13/2023
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
## 2023-12-13 NOTE — Progress Notes (Signed)
 Virtual Visit via Video Note   I connected with Jerry Thornton, who prefers to go by "Jerry Thornton" on 12/13/23 at 9:00am by video enabled telemedicine application and verified that I am speaking with the correct person using two identifiers.   I discussed the limitations, risks, security and privacy concerns of performing an evaluation and management service by video and the availability of in person appointments. I also discussed with the patient that there may be a patient responsible charge related to this service. The patient expressed understanding and agreed to proceed.   I discussed the assessment and treatment plan with the patient. The patient was provided an opportunity to ask questions and all were answered. The patient agreed with the plan and demonstrated an understanding of the instructions.   The patient was advised to call back or seek an in-person evaluation if the symptoms worsen or if the condition fails to improve as anticipated.   I provided 1 hour of non-face-to-face time during this encounter.     Noralee Stain, LCSW, LCAS ____________________________  THERAPIST PROGRESS NOTE   Session Time:  9:00am - 10:00am   Location: Patient: Patient home Provider: Home Office   Participation Level: Active    Behavioral Response: Alert, casually dressed, euthymic mood/affect   Type of Therapy:  Individual Therapy   Treatment Goals addressed: Depression management; Medication compliance   Progress Towards Goals: Progressing   Interventions: CBT, supportive therapy, strengths based    Summary: Jerry Thornton, who prefers to go by "Jerry Thornton" is a 58 year old married Caucasian male that presented today with diagnosis of Major Depressive Disorder, recurrent, severe without psychotic features.          Suicidal/Homicidal: None; without intent or plan.     Therapist Response:  Clinician met with Jerry Thornton today for virtual therapy appointment and assessed for safety, medication compliance,  and sobriety.  Jerry Thornton was scheduled for group therapy, but due to low MHIOP census today, an individual session was offered instead.  Jerry Thornton presented for session on time and was alert, oriented x5, with no evidence or self-report of active SI/HI or A/V H.  Jerry Thornton denied any use of alcohol or illicit substances. He reported compliance with behavioral medications.  Clinician inquired about Jerry Thornton's current emotional ratings, as well as any significant changes in thoughts, feelings or behavior since last check-in.  Jerry Thornton reported scores of 2/10 for depression, 2/10 for anxiety, and 2/10 for irritability.  Jerry Thornton denied any recent panic attacks or outbursts.  Jerry Thornton reported that a recent success was going for a walk in the woods with his dog for exercise.  He reported that his goal is to ride his bike on the [facility] after therapy concludes today.  Jerry Thornton reported that one struggle is worrying about how he will cope with his transition back to fulltime work, despite progress he has made during MHIOP.  Clinician introduced topic of creating mental health maintenance plan today.  Clinician shared a virtual handout with Jerry Thornton, which stressed the importance of maintaining one's mental health in a similar way to using diet and exercise to ensure physical health.  Clinician walked Jerry Thornton through process of identifying triggers which could worsen symptoms, including specific people, places, and things one needs to avoid.  He was also tasked with identifying warning signs such as thoughts, feelings, or behaviors which could indicate mental health is at increased risk.  Clinician also facilitated conversation on self-care activities and coping strategies which he had previously utilized in the past, is currently  using in daily routine, or plans to use soon to assist with managing problems or symptoms when/if they appear.  Clinician encouraged Jerry Thornton to revisit his maintenance plan often and make changes as needed to ensure day to day  stability.  Intervention was effective, as evidenced by Jerry Thornton participating in activity and creating a comprehensive plan, including identification of triggers such as feeling overwhelmed at his job with duties, difficult social interactions with coworkers on the subject of his mental health or absence from the workplace during therapy, and warning signs including racing negative thoughts or ruminations, isolating or withholding information from close supports, and lack of sleep.  Jerry Thornton also reported that he would make an effort to set aside time for self-care activities such as exercising, making more time for socialization with friends/family, and spending time in nature, and use coping skills such as progressive muscle relaxation and recitation of positive affirmations such as "You matter and what you have to offer this world also matters" to manage stressors.  Jerry Thornton stated "It's a big deal what I'm trying to do here".  Clinician will continue to monitor.   Plan: Jerry Thornton has completed MHIOP and will be discharged today.  Counselor recommends adherence to crisis/safety plan, taking medications as prescribed, and following up with medical professionals if any issues arise.   Diagnosis: Major Depressive Disorder, recurrent, severe without psychotic features.         Collaboration of Care:   Medication Management AEB Hillery Jacks, NP or Dr. Park Pope                                          Case Manager AEB Jeri Modena, CNA                                                    Patient/Guardian was advised Release of Information must be obtained prior to any record release in order to collaborate their care with an outside provider. Patient/Guardian was advised if they have not already done so to contact the registration department to sign all necessary forms in order for [place] to release information regarding their care.    Consent: Patient/Guardian gives verbal consent for treatment and assignment of benefits for  services provided during this visit. Patient/Guardian expressed understanding and agreed to proceed.   Noralee Stain, LCSW, LCAS 12/13/23

## 2023-12-15 ENCOUNTER — Encounter (HOSPITAL_COMMUNITY): Payer: Self-pay | Admitting: Psychiatry

## 2023-12-19 ENCOUNTER — Other Ambulatory Visit (HOSPITAL_COMMUNITY): Payer: BC Managed Care – PPO

## 2023-12-21 ENCOUNTER — Ambulatory Visit (INDEPENDENT_AMBULATORY_CARE_PROVIDER_SITE_OTHER): Payer: BC Managed Care – PPO | Admitting: Licensed Clinical Social Worker

## 2023-12-21 DIAGNOSIS — F332 Major depressive disorder, recurrent severe without psychotic features: Secondary | ICD-10-CM

## 2023-12-22 ENCOUNTER — Telehealth (HOSPITAL_COMMUNITY): Payer: BC Managed Care – PPO | Admitting: Psychiatry

## 2023-12-22 ENCOUNTER — Encounter (HOSPITAL_COMMUNITY): Payer: Self-pay | Admitting: Psychiatry

## 2023-12-22 VITALS — Wt 215.0 lb

## 2023-12-22 DIAGNOSIS — F323 Major depressive disorder, single episode, severe with psychotic features: Secondary | ICD-10-CM | POA: Diagnosis not present

## 2023-12-22 DIAGNOSIS — F419 Anxiety disorder, unspecified: Secondary | ICD-10-CM

## 2023-12-22 MED ORDER — ARIPIPRAZOLE 15 MG PO TABS: 15.0000 mg | ORAL_TABLET | Freq: Every day | ORAL | 1 refills | Status: DC

## 2023-12-22 MED ORDER — OXCARBAZEPINE 300 MG PO TABS: 300.0000 mg | ORAL_TABLET | Freq: Every day | ORAL | 1 refills | Status: DC

## 2023-12-22 NOTE — Progress Notes (Signed)
 BH MD/PA/NP OP Progress Note  Virtual Visit via Video Note  I connected with Jerry Thornton on 12/22/23 at  2:40 PM EST by a video enabled telemedicine application and verified that I am speaking with the correct person using two identifiers.  Location: Patient: Home Provider: Office   I discussed the limitations of evaluation and management by telemedicine and the availability of in person appointments. The patient expressed understanding and agreed to proceed.   12/22/2023 2:39 PM Jerry Thornton  MRN:  [id number]  Chief Complaint:  Chief Complaint  Patient presents with   Follow-up   HPI: Patient is evaluated by video session.  He recently finished the IOP.  He is doing much better and he noticed the big difference like day and light.  Though he admitted cut down his Trileptal because higher dose was causing some numbness in his finger.  He reported since Trileptal cut down he is feeling better.  He sleeps good.  He has a lot of energy and motivation to do things.  He started walking with dog, doing exercise regularly, started swimming and weightlifting.  He like to be active and he is ready to go back to work.  Is going to start work on Monday and his coworkers are very supportive and accommodating.  Patient has discussed if things do not work out then he can work from home.  He also linked with a therapist and had a good session with a counselor.  He denies any paranoia, hallucination, irritability, crying spells or any feeling of hopelessness or worthlessness.  His appetite is okay.  He admitted some time feel to urgent move and he read about the side effects of Abilify.  He is not sure if he has akathisia but he realized medicine working and helping him.  He denies any tremors or shakes.  Patient reported family is doing well.  He has 2 children who lives out of town.  He denies drinking or using any illegal substances.   Visit Diagnosis:    ICD-10-CM   1. MDD (major depressive  disorder), single episode, severe with psychotic features (HCC)  F32.3 ARIPiprazole (ABILIFY) 15 MG tablet    Oxcarbazepine (TRILEPTAL) 300 MG tablet    2. Anxiety  F41.9 ARIPiprazole (ABILIFY) 15 MG tablet      Past Psychiatric History: Reviewed  H/O depression, psychosis. Last inpatient at [facility] in January 2025.  H/O inpatient in 2017 and in 1999 due to psychosis and depression. Given Wellbutrin, Depakote, Abilify, trazodone and Ativan but d/c due to side effects and weight gain. No history of suicidal attempt.  Lexapro worked for a while until discontinued on his own.   Past Medical History:  Past Medical History:  Diagnosis Date   Allergy    Anxiety    Depression    No past surgical history on file.   Family History:  Family History  Problem Relation Age of Onset   Depression Father    Mental illness Father    Mental illness Brother    Suicidality Brother    Depression Brother    Heart disease Paternal Grandfather     Social History:  Social History   Socioeconomic History   Marital status: Married    Spouse name: Not on file   Number of children: 2   Years of education: Not on file   Highest education level: Professional school degree (e.g., MD, DDS, DVM, JD)  Occupational History   Not on file  Tobacco Use  Smoking status: Never   Smokeless tobacco: Never  Vaping Use   Vaping status: Never Used  Substance and Sexual Activity   Alcohol use: No    Comment: Less than 1 drink a week.    Drug use: No   Sexual activity: Yes    Partners: Female    Birth control/protection: None  Other Topics Concern   Not on file  Social History Narrative   Not on file   Social Drivers of Health   Financial Resource Strain: Low Risk  (06/28/2023)   Received from [facility]   Overall Financial Resource Strain (CARDIA)    Difficulty of Paying Living Expenses: Not hard at all  Food Insecurity: Low Risk  (11/03/2023)   Received from [facility]    Food Insecurity    Within the past 12 months, did the food you bought just not last and you didn't have money to get more?: No    Within the past 12 months, did you worry that your food would run out before you got money to buy more?: No  Transportation Needs: Low Risk  (11/03/2023)   Received from [facility]   Transportation Needs    Within the past 12 months, has a lack of transportation kept you from medical appointments or from doing things needed for daily living?: No  Physical Activity: Sufficiently Active (06/28/2023)   Received from [facility]   Exercise Vital Sign    Days of Exercise per Week: 7 days    Minutes of Exercise per Session: 60 min  Stress: No Stress Concern Present (06/28/2023)   Received from [facility] - Occupational Stress Questionnaire    Feeling of Stress : Only a little  Social Connections: Moderately Integrated (06/28/2023)   Received from [facility]   Social Network    How would you rate your social network (family, work, friends)?: Adequate participation with social networks    Allergies: No Known Allergies  Metabolic Disorder Labs: Lab Results  Component Value Date   HGBA1C 5.0 10/05/2016   MPG 97 10/05/2016   Lab Results  Component Value Date   PROLACTIN 10.8 10/05/2016   Lab Results  Component Value Date   CHOL 190 10/05/2016   TRIG 108 10/05/2016   HDL 59 10/05/2016   CHOLHDL 3.2 10/05/2016   VLDL 22 10/05/2016   LDLCALC 109 (H) 10/05/2016   Lab Results  Component Value Date   TSH 0.758 10/05/2016    Therapeutic Level Labs: No results found for: "LITHIUM" No results found for: "VALPROATE" No results found for: "CBMZ"  Current Medications: Current Outpatient Medications  Medication Sig Dispense Refill   ARIPiprazole (ABILIFY) 15 MG tablet Take 1 tablet (15 mg total) by mouth daily. 30 tablet 0   cloNIDine (CATAPRES - DOSED IN MG/24 HR) 0.1 mg/24hr patch 0.1 mg once  a week.     escitalopram (LEXAPRO) 10 MG tablet Take 1 tablet (10 mg total) by mouth daily. 90 tablet 1   Omega-3 Fatty Acids (FISH OIL PO) Take 1 capsule by mouth daily. Reported on 04/26/2016     Oxcarbazepine (TRILEPTAL) 300 MG tablet Take 1 tablet (300 mg total) by mouth daily. 30 tablet 0   No current facility-administered medications for this visit.     Musculoskeletal: Strength & Muscle Tone: within normal limits Gait & Station: normal Patient leans: N/A  Psychiatric Specialty Exam: Review of Systems  Weight 215 lb (97.5 kg).There is  no height or weight on file to calculate BMI.  General Appearance: Casual  Eye Contact:  Good  Speech:  Normal Rate  Volume:  Normal  Mood:  Euthymic  Affect:  Appropriate  Thought Process:  Goal Directed  Orientation:  Full (Time, Place, and Person)  Thought Content: Logical   Suicidal Thoughts:  No  Homicidal Thoughts:  No  Memory:  Immediate;   Good Recent;   Good Remote;   Good  Judgement:  Good  Insight:  Good  Psychomotor Activity:  Normal  Concentration:  Concentration: Good and Attention Span: Good  Recall:  Good  Fund of Knowledge: Good  Language: Good  Akathisia:  No  Handed:  Right  AIMS (if indicated): not done  Assets:  Communication Skills Desire for Improvement Housing Social Support Talents/Skills Transportation  ADL's:  Intact  Cognition: WNL  Sleep:  Good   Screenings: AUDIT    Flowsheet Row [facility]  Alcohol Use Disorder Identification Test Final Score (AUDIT) 0      PHQ2-9    Flowsheet Row Counselor from 11/22/2023 in [facility] Office Visit from 01/06/2018 in [facility] [place] Counselor from 11/15/2016 in [facility] Visit from 06/24/2015 in [facility] [name] Office Visit from 06/09/2015 in [facility] Of [facility]. [facility]  PHQ-2 Total Score 2 0 6 0 0  PHQ-9 Total Score 13 -- 25 -- --      Flowsheet Row Counselor from 11/22/2023 in [facility] Video [facility]  C-SSRS RISK CATEGORY Error: Q7 should not be populated when Q6 is No No Risk        Assessment and Plan: I reviewed notes from IOP.  Medicine adjustments were done in the IOP.  He cut down his Trileptal and only taking once a day because of numbness in his fingers.  I recommend he need to watch his symptoms closely because in the past 3 times he stopped taking the medication and decompensated.  Patient understand and like to avoid further reduction of the medication and wants to be stable on meds.  So far no major concern from the medication.  I discussed about possible akathisia but it is not interfering in his daily activities and does not feel he need something to address that.  For now we will continue Trileptal 300 mg daily and Abilify 15 mg daily.  I also encouraged to keep appointment with therapist.  Patient is going to start work starting Monday and if he has any issues and he will contact us.  Will follow-up in 6 weeks to 8 weeks unless patient require any sooner appointment.  Patient has a good support from his family and coworkers.  Collaboration of Care: Collaboration of Care: Other provider involved in patient's care AEB notes are available in epic to review  Patient/Guardian was advised Release of Information must be obtained prior to any record release in order to collaborate their care with an outside provider. Patient/Guardian was advised if they have not already done so to contact the registration department to sign all necessary forms in order for [place] to release information regarding their care.   Consent: Patient/Guardian gives verbal consent for treatment and assignment of benefits for  services provided during this  visit. Patient/Guardian expressed understanding and agreed to proceed.    Follow Up Instructions:    I discussed the assessment and treatment plan with the patient. The patient was provided an opportunity to ask questions and all were answered. The patient agreed with the plan and demonstrated an understanding of the instructions.   The patient was advised to call back or seek an in-person evaluation if the symptoms worsen or if the condition fails to improve as anticipated.  I provided 24 minutes of non-face-to-face time during this encounter.  Cleotis Nipper, MD 12/22/2023, 2:39 PM

## 2023-12-26 ENCOUNTER — Other Ambulatory Visit (HOSPITAL_COMMUNITY): Payer: BC Managed Care – PPO

## 2023-12-27 ENCOUNTER — Encounter (HOSPITAL_COMMUNITY): Payer: Self-pay

## 2023-12-27 ENCOUNTER — Encounter (HOSPITAL_COMMUNITY): Payer: Self-pay | Admitting: Licensed Clinical Social Worker

## 2023-12-27 NOTE — Progress Notes (Signed)
 Virtual Visit via Video Note  I connected with Jerry Thornton on 12/21/23 at 12:30 PM EST by a video enabled telemedicine application and verified that I am speaking with the correct person using two identifiers.  Location: Patient: home Provider: home office   I discussed the limitations of evaluation and management by telemedicine and the availability of in person appointments. The patient expressed understanding and agreed to proceed.   I discussed the assessment and treatment plan with the patient. The patient was provided an opportunity to ask questions and all were answered. The patient agreed with the plan and demonstrated an understanding of the instructions.   The patient was advised to call back or seek an in-person evaluation if the symptoms worsen or if the condition fails to improve as anticipated.  I provided 55 minutes of non-face-to-face time during this encounter.   Veneda Melter, LCSW   THERAPIST PROGRESS NOTE  Session Time: 12:30-1:25pm  Participation Level: Active  Behavioral Response: NeatAlertEuthymic  Type of Therapy: Individual Therapy  Treatment Goals addressed:  Problem: OP Depression     Dates: Start:  12/27/23       Disciplines: Interdisciplinary, PROVIDER        Goal: LTG: Reduce frequency, intensity, and duration of depression symptoms so that daily functioning is improved     Dates: Start:  12/27/23    Expected End:  06/22/24       Disciplines: Interdisciplinary, PROVIDER             Goal: LTG: Increase coping skills to manage depression and improve ability to perform daily activities     Dates: Start:  12/27/23    Expected End:  06/22/24       Disciplines: Interdisciplinary, PROVIDER             Intervention: Work with Jerry Thornton to track symptoms, triggers, and/or skill use through a mood chart, diary card, or journal     Dates: Start:  12/27/23               ProgressTowards Goals: Initial  Interventions: CBT  Summary:  Jerry Thornton is a 58 y.o. male who presents with MDD. Recurrent, severe with psychotic features.   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 Clinician utilized CBT to explore thoughts, feelings, and interactions. Clinician built rapport by reviewing experiences that led to recent hospitalization and participation in IOP. Clinician processed changes in life, which seem good, but his body has reacted with fear and anxiety. Clinician provided psychoeducation about the brain and body's inability to differentiate between fear and excitement. Clinician explored current coping skills and plan to return to work next week.   Plan: Return again in 1-2 weeks.  Diagnosis: Severe episode of recurrent major depressive disorder, without psychotic features (HCC)  Collaboration of Care: Psychiatrist AEB updated Dr. Lolly Mustache  Patient/Guardian was advised Release of Information must be obtained prior to any record release in order to collaborate their care with an outside provider. Patient/Guardian was advised if they have not already done so to contact the registration department to sign all necessary forms in order for [place] to release information regarding their care.   Consent: Patient/Guardian gives verbal consent for treatment and assignment of benefits for services provided during this visit. Patient/Guardian expressed understanding and agreed to proceed.   Jerry Thornton [name], LCSW 12/27/2023

## 2023-12-28 ENCOUNTER — Ambulatory Visit (INDEPENDENT_AMBULATORY_CARE_PROVIDER_SITE_OTHER): Admitting: Licensed Clinical Social Worker

## 2023-12-28 DIAGNOSIS — F323 Major depressive disorder, single episode, severe with psychotic features: Secondary | ICD-10-CM | POA: Diagnosis not present

## 2024-01-02 ENCOUNTER — Other Ambulatory Visit (HOSPITAL_COMMUNITY): Payer: BC Managed Care – PPO

## 2024-01-07 ENCOUNTER — Encounter (HOSPITAL_COMMUNITY): Payer: Self-pay | Admitting: Licensed Clinical Social Worker

## 2024-01-07 NOTE — Progress Notes (Signed)
 Virtual Visit via Video Note  I connected with Jerry Thornton on 12/28/23 at 11:00 AM EDT by a video enabled telemedicine application and verified that I am speaking with the correct person using two identifiers.  Location: Patient:: Parked car Provider: Home office   I discussed the limitations of evaluation and management by telemedicine and the availability of in person appointments. The patient expressed understanding and agreed to proceed.     I discussed the assessment and treatment plan with the patient. The patient was provided an opportunity to ask questions and all were answered. The patient agreed with the plan and demonstrated an understanding of the instructions.   The patient was advised to call back or seek an in-person evaluation if the symptoms worsen or if the condition fails to improve as anticipated.  I provided 45 minutes of non-face-to-face time during this encounter.   Jerry Melter, LCSW   THERAPIST PROGRESS NOTE  Session Time: 11:00am-11:45am  Participation Level: Active  Behavioral Response: Well GroomedAlertAnxious and Euthymic  Type of Therapy: Individual Therapy  Treatment Goals addressed:       Problem: OP Depression      Dates: Start:  12/27/23        Disciplines: Interdisciplinary, PROVIDER            Goal: LTG: Reduce frequency, intensity, and duration of depression symptoms so that daily functioning is improved     Dates: Start:  12/27/23    Expected End:  06/22/24       Disciplines: Interdisciplinary, PROVIDER                Goal: LTG: Increase coping skills to manage depression and improve ability to perform daily activities     Dates: Start:  12/27/23    Expected End:  06/22/24       Disciplines: Interdisciplinary, PROVIDER                Intervention: Work with Jerry Thornton to track symptoms, triggers, and/or skill use through a mood chart, diary card, or journal     Dates: Start:  12/27/23                 ProgressTowards Goals: Progressing  Interventions: CBT  Summary: Jerry Thornton is a 58 y.o. male who presents with MDD, severe, with psychotic features.   Suicidal/Homicidal: Nowithout intent/plan  Therapist Response: Jerry Thornton engaged well in individual virtual session.  Clinician utilized CBT to process thoughts feelings and interactions.  Clinician explored updates regarding the job and returning to work.  Clinician identified pros and cons of being back in work, noting challenges, and also identifying Jerry Thornton's ability to meet those challenges and overcome them.  Clinician discussed mood and worries.  Jerry Thornton shared some internal processes that identified increased some anxiety, particularly with peer interactions.  Clinician identified the reality of feeling people's "vibes".  Clinician also noted that anxiety can alter his perception of other people's attitudes.  Clinician discussed the importance of good communication.  Clinician also validated that because these are new people and a new environment, it is okay to take his time to learn the culture of this new company.  Plan: Return again in 2 weeks.  Diagnosis: MDD (major depressive disorder), single episode, severe with psychotic features (HCC)  Collaboration of Care: Psychiatrist AEB reviewed Dr. Sheela Stack note  Patient/Guardian was advised Release of Information must be obtained prior to any record release in order to collaborate their care with an outside provider. Patient/Guardian was advised if  they have not already done so to contact the registration department to sign all necessary forms in order for [place] to release information regarding their care.   Consent: Patient/Guardian gives verbal consent for treatment and assignment of benefits for services provided during this visit. Patient/Guardian expressed understanding and agreed to proceed.   Jerry Heck [place], LCSW 01/07/2024

## 2024-01-09 ENCOUNTER — Other Ambulatory Visit (HOSPITAL_COMMUNITY): Payer: BC Managed Care – PPO

## 2024-01-11 ENCOUNTER — Encounter (HOSPITAL_COMMUNITY): Payer: Self-pay | Admitting: Licensed Clinical Social Worker

## 2024-01-11 ENCOUNTER — Ambulatory Visit (HOSPITAL_COMMUNITY): Admitting: Licensed Clinical Social Worker

## 2024-01-11 DIAGNOSIS — F323 Major depressive disorder, single episode, severe with psychotic features: Secondary | ICD-10-CM | POA: Diagnosis not present

## 2024-01-11 NOTE — Progress Notes (Signed)
 Virtual Visit via Video Note  I connected with Jerry Thornton on 01/11/24 at  8:00 AM EDT by a video enabled telemedicine application and verified that I am speaking with the correct person using two identifiers.  Location: Patient: Work Provider: Home office   I discussed the limitations of evaluation and management by telemedicine and the availability of in person appointments. The patient expressed understanding and agreed to proceed.     I discussed the assessment and treatment plan with the patient. The patient was provided an opportunity to ask questions and all were answered. The patient agreed with the plan and demonstrated an understanding of the instructions.   The patient was advised to call back or seek an in-person evaluation if the symptoms worsen or if the condition fails to improve as anticipated.  I provided 55 minutes of non-face-to-face time during this encounter.   Jerry Melter, LCSW   THERAPIST PROGRESS NOTE  Session Time: 8:00am-8:55am  Participation Level: Active  Behavioral Response: Well GroomedAlertEuthymic  Type of Therapy: Individual Therapy  Treatment Goals addressed:  Problem: OP Depression        Dates: Start:  12/27/23          Disciplines: Interdisciplinary, PROVIDER              Goal: LTG: Reduce frequency, intensity, and duration of depression symptoms so that daily functioning is improved     Dates: Start:  12/27/23    Expected End:  06/22/24       Disciplines: Interdisciplinary, PROVIDER                Goal: LTG: Increase coping skills to manage depression and improve ability to perform daily activities     Dates: Start:  12/27/23    Expected End:  06/22/24       Disciplines: Interdisciplinary, PROVIDER                Intervention: Work with Jerry Thornton to track symptoms, triggers, and/or skill use through a mood chart, diary card, or journal     Dates: Start:  12/27/23                   ProgressTowards Goals:  Progressing  Interventions: CBT  Summary: Jerry Thornton is a 58 y.o. male who presents with MDD, single episode, severe with psychotic features.   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  Clinician utilized CBT to process thoughts feelings and interactions.  Clinician explored updates with apartment and new job.  Jerry Thornton shared mood has been relatively stable however there have been some challenges in adjusting to his new surroundings.  Clinician processed pros and cons of living and working in 2 different places.  Clinician explored the plan for stabilizing housing in order to be able to live with his wife every day, rather than commuting and staying in a different city.  Clinician explored socialization and making friendships with colleagues.  Clinician also explored current coping skills, including daily exercise, progressive muscle relaxation, and deep breathing.  Jerry Thornton shared that these are helpful tools that assist in maintaining stable mood.  Plan: Return again in 2 weeks.  Diagnosis: MDD (major depressive disorder), single episode, severe with psychotic features (HCC)  Collaboration of Care: Patient refused AEB none required  Patient/Guardian was advised Release of Information must be obtained prior to any record release in order to collaborate their care with an outside provider. Patient/Guardian was advised if they  have not already done so to contact the registration department to sign all necessary forms in order for [place] to release information regarding their care.   Consent: Patient/Guardian gives verbal consent for treatment and assignment of benefits for services provided during this visit. Patient/Guardian expressed understanding and agreed to proceed.   Jerry Heck [place], LCSW 01/11/2024

## 2024-01-26 ENCOUNTER — Ambulatory Visit (HOSPITAL_COMMUNITY): Admitting: Licensed Clinical Social Worker

## 2024-02-08 ENCOUNTER — Ambulatory Visit (INDEPENDENT_AMBULATORY_CARE_PROVIDER_SITE_OTHER): Admitting: Licensed Clinical Social Worker

## 2024-02-08 ENCOUNTER — Encounter (HOSPITAL_COMMUNITY): Payer: Self-pay | Admitting: Licensed Clinical Social Worker

## 2024-02-08 DIAGNOSIS — F321 Major depressive disorder, single episode, moderate: Secondary | ICD-10-CM | POA: Diagnosis not present

## 2024-02-08 DIAGNOSIS — F331 Major depressive disorder, recurrent, moderate: Secondary | ICD-10-CM

## 2024-02-08 NOTE — Progress Notes (Signed)
 Virtual Visit via Video Note  I connected with Jerry Thornton on 02/08/24 at  2:30 PM EDT by a video enabled telemedicine application and verified that I am speaking with the correct person using two identifiers.  Location: Patient: office Provider: home office   I discussed the limitations of evaluation and management by telemedicine and the availability of in person appointments. The patient expressed understanding and agreed to proceed.   I discussed the assessment and treatment plan with the patient. The patient was provided an opportunity to ask questions and all were answered. The patient agreed with the plan and demonstrated an understanding of the instructions.   The patient was advised to call back or seek an in-person evaluation if the symptoms worsen or if the condition fails to improve as anticipated.  I provided 55 minutes of non-face-to-face time during this encounter.   Jerry Dago, LCSW   THERAPIST PROGRESS NOTE  Session Time: 2:30pm-3:25pm  Participation Level: Active  Behavioral Response: Well GroomedAlertEuthymic  Type of Therapy: Individual Therapy  Treatment Goals addressed:  Problem: OP Depression         Dates: Start:  12/27/23           Disciplines: Interdisciplinary, PROVIDER               Goal: LTG: Reduce frequency, intensity, and duration of depression symptoms so that daily functioning is improved     Dates: Start:  12/27/23    Expected End:  06/22/24       Disciplines: Interdisciplinary, PROVIDER                Goal: LTG: Increase coping skills to manage depression and improve ability to perform daily activities     Dates: Start:  12/27/23    Expected End:  06/22/24       Disciplines: Interdisciplinary, PROVIDER                Intervention: Work with Authur Leghorn to track symptoms, triggers, and/or skill use through a mood chart, diary card, or journal     Dates: Start:  12/27/23                    ProgressTowards Goals:  Progressing  Interventions: CBT  Summary: Jerry Thornton is a 58 y.o. male who presents with MDD, moderate.   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 Clinician utilized CBT to process thoughts, feelings, and interactions. Clinician explored recent big decisions made by Jerry [place] and his wife. Clinician reflected the ease and comfort in the decisions made about whether or not to move to [place], making plans for commute/housing in [place], and relationships with family. Clinician processed decision making and confidence in the decision to stop taking Abilify  due to feeling "like he is covered in a heavy wool blanket", making it difficult to get motivated, focus, brain fog, and lethargy. Clinician noted that it has been since the week after our last session (around 4/1) that he last took the medication. Since going off, Jerry [place] reports he has more energy, can enjoy his life, and he is more likely to complete tasks without having to think about the "challenges". Clinician noted that Trileptal  continues to maintain stable mood and gives enough lift so that Jerry [place] feels well.  Clinician discussed work schedule with Jerry [place] and identified interest in getting accommodations for 1-2 days of working from home per week. Clinician noted improvement in connection with wife, improvement in productivity  when working from home, and positive mood overall. Jerry [place] shared that he and wife will keep their home in [place], which makes both of them quite happy.   Plan: Return again in 2 weeks.  Diagnosis: No diagnosis found.  Collaboration of Care: Psychiatrist AEB informed Dr. Arfeen about [place] stopping Abilify . He will see Dr. Carlos [place] next Friday.   Patient/Guardian was advised Release of Information must be obtained prior to any record release in order to collaborate their care with an outside provider. Patient/Guardian was advised if they have  not already done so to contact the registration department to sign all necessary forms in order for us  to release information regarding their care.   Consent: Patient/Guardian gives verbal consent for treatment and assignment of benefits for services provided during this visit. Patient/Guardian expressed understanding and agreed to proceed.   Jerry Stare [place], LCSW 02/08/2024

## 2024-02-17 ENCOUNTER — Encounter (HOSPITAL_COMMUNITY): Payer: Self-pay | Admitting: Psychiatry

## 2024-02-17 ENCOUNTER — Telehealth (HOSPITAL_COMMUNITY): Admitting: Psychiatry

## 2024-02-17 DIAGNOSIS — F323 Major depressive disorder, single episode, severe with psychotic features: Secondary | ICD-10-CM

## 2024-02-17 DIAGNOSIS — F419 Anxiety disorder, unspecified: Secondary | ICD-10-CM

## 2024-02-17 MED ORDER — OXCARBAZEPINE 300 MG PO TABS: 300.0000 mg | ORAL_TABLET | Freq: Every day | ORAL | 1 refills | Status: DC

## 2024-02-17 MED ORDER — ARIPIPRAZOLE 2 MG PO TABS: 2.0000 mg | ORAL_TABLET | Freq: Every day | ORAL | 1 refills | Status: DC

## 2024-02-17 NOTE — Progress Notes (Signed)
  Health MD Virtual Progress Note   Patient Location: Home Provider Location: Home Office  I connect with patient by video and verified that I am speaking with correct person by using two identifiers. I discussed the limitations of evaluation and management by telemedicine and the availability of in person appointments. I also discussed with the patient that there may be a patient responsible charge related to this service. The patient expressed understanding and agreed to proceed.  Jerry Thornton [id number] 58 y.o.  02/17/2024 8:45 AM  History of Present Illness:  Patient is evaluated by video session.  He had a stop the Abilify  15 mg because he was feeling brain fog, unmotivated, not engage in feet boredom with decreased energy.  Since he stopped Abilify  he noticed improvement and has no longer side effects.  He is taking Trileptal  as prescribed.  He admitted stress from work and he requested to work from home 2 times a week.  Currently he is working only 1 day a week.  Patient also told had plan initially to moved out to [place] area because of his job but now he is thinking to have apartment there and live 3 days and come back to home.  Otherwise patient feels sleep is good.  He is exercising, walking and watching sports.  His energy level is good.  Denies any irritability, anger, mania, psychosis or any hallucination.  However he admitted ruminative thoughts and anxiety about the future.  Denies any hopelessness or worthlessness.  He has no tremors or shakes.  He is in therapy with Camilo Cella.  Patient lives with his wife and his 2 children who lives out of town.  Past Psychiatric History: H/O depression, psychosis. Last inpatient at [facility] in January 2025.  Did IOP.  H/O inpatient in 2017 and in 1999 due to psychosis and depression. Given Wellbutrin, Depakote, Abilify , trazodone  and Ativan  but d/c due to side effects and weight gain. No history of suicidal attempt.   Lexapro  worked for a while until discontinued on his own.    Outpatient Encounter Medications as of 02/17/2024  Medication Sig   ARIPiprazole  (ABILIFY ) 15 MG tablet Take 1 tablet (15 mg total) by mouth daily.   cloNIDine (CATAPRES - DOSED IN MG/24 HR) 0.1 mg/24hr patch 0.1 mg once a week.   escitalopram  (LEXAPRO ) 10 MG tablet Take 1 tablet (10 mg total) by mouth daily.   Omega-3 Fatty Acids (FISH OIL PO) Take 1 capsule by mouth daily. Reported on 04/26/2016   Oxcarbazepine  (TRILEPTAL ) 300 MG tablet Take 1 tablet (300 mg total) by mouth daily.   No facility-administered encounter medications on file as of 02/17/2024.    No results found for this or any previous visit (from the past 2160 hours).   Psychiatric Specialty Exam: Physical Exam  Review of Systems  There were no vitals taken for this visit.There is no height or weight on file to calculate BMI.  General Appearance: Casual  Eye Contact:  Good  Speech:  Slow  Volume:  Decreased  Mood:  Anxious  Affect:  Congruent  Thought Process:  Goal Directed  Orientation:  Full (Time, Place, and Person)  Thought Content:  Rumination  Suicidal Thoughts:  No  Homicidal Thoughts:  No  Memory:  Immediate;   Good Recent;   Good Remote;   Good  Judgement:  Good  Insight:  Shallow  Psychomotor Activity:  Normal  Concentration:  Concentration: Good and Attention Span: Fair  Recall:  Good  Fund of Knowledge:  Good  Language:  Good  Akathisia:  No  Handed:  Right  AIMS (if indicated):     Assets:  Communication Skills Desire for Improvement Housing Social Support Talents/Skills Transportation  ADL's:  Intact  Cognition:  WNL  Sleep:  good       11/22/2023    9:53 AM 01/06/2018   11:38 AM 11/15/2016    8:59 AM 06/24/2015   10:27 AM 06/24/2015   10:26 AM  Depression screen PHQ 2/9  Decreased Interest 1 0 3 0 0  Down, Depressed, Hopeless 1 0 3 0 0  PHQ - 2 Score 2 0 6 0 0  Altered sleeping 1  3    Tired, decreased energy 1  3     Change in appetite 1  1    Feeling bad or failure about yourself  2  3    Trouble concentrating 2  3    Moving slowly or fidgety/restless 2  3    Suicidal thoughts 2  3    PHQ-9 Score 13  25    Difficult doing work/chores Very difficult  Extremely dIfficult      Assessment/Plan: MDD (major depressive disorder), single episode, severe with psychotic features (HCC) - Plan: ARIPiprazole  (ABILIFY ) 2 MG tablet, Oxcarbazepine  (TRILEPTAL ) 300 MG tablet  Anxiety - Plan: ARIPiprazole  (ABILIFY ) 2 MG tablet  I reviewed notes from other provider.  Discussed risk of relapse since not taking the Abilify .  I explained there is a possibility he is having the side effects of Abilify  and stopping the Abilify  may help because he is no longer having side effects.  However we discussed long-term prognosis as patient has history of multiple hospitalization due to noncompliance with medication.  I discussed the concern of decompensation.  He admitted some anxiety and ruminative thoughts as job is challenging.  He has requested to work at least 2 days a week from home.  He is also going to live in [facility] from his family 3 days a week.  He admitted that is concerned because he is going to be by himself at least 3 days a week.  So far he is doing better and engaging but given the risk of noncompliance with medication and inpatient we discussed to restart at least low-dose Abilify  and keep the current Trileptal .  Patient agreed with the plan.  We will closely monitor his symptoms.  Encouraged to keep appointment with therapist.  Will start Abilify  2 mg daily and keep the Trileptal  300 mg daily.  Patient has cut down his Trileptal  before because of side effects.  We discussed reducing the medication may not be appropriate at this time.  I also discussed in the future should inform the provider about any side effects.  Will follow-up in 2 months.  Patient has good support from his family.   Follow Up Instructions:      I discussed the assessment and treatment plan with the patient. The patient was provided an opportunity to ask questions and all were answered. The patient agreed with the plan and demonstrated an understanding of the instructions.   The patient was advised to call back or seek an in-person evaluation if the symptoms worsen or if the condition fails to improve as anticipated.    Collaboration of Care: Other provider involved in patient's care AEB notes are available in epic to review  Patient/Guardian was advised Release of Information must be obtained prior to any record release in order to collaborate their care with  an outside provider. Patient/Guardian was advised if they have not already done so to contact the registration department to sign all necessary forms in order for us  to release information regarding their care.   Consent: Patient/Guardian gives verbal consent for treatment and assignment of benefits for services provided during this visit. Patient/Guardian expressed understanding and agreed to proceed.     Total encounter time 28 minutes which includes face-to-face time, chart reviewed, care coordination, order entry and documentation during this encounter.   Note: This document was prepared by [employer] voice dictation technology and any errors that results from this process are unintentional.    Arturo Late, MD 02/17/2024

## 2024-02-22 ENCOUNTER — Encounter (HOSPITAL_COMMUNITY): Payer: Self-pay | Admitting: Licensed Clinical Social Worker

## 2024-02-22 ENCOUNTER — Ambulatory Visit (HOSPITAL_COMMUNITY): Admitting: Licensed Clinical Social Worker

## 2024-02-22 DIAGNOSIS — F331 Major depressive disorder, recurrent, moderate: Secondary | ICD-10-CM

## 2024-02-22 NOTE — Progress Notes (Signed)
 Virtual Visit via Video Note  I connected with Lester Rather on 02/22/24 at  4:30 PM EDT by a video enabled telemedicine application and verified that I am speaking with the correct person using two identifiers.  Location: Patient: home Provider: home office   I discussed the limitations of evaluation and management by telemedicine and the availability of in person appointments. The patient expressed understanding and agreed to proceed.   I discussed the assessment and treatment plan with the patient. The patient was provided an opportunity to ask questions and all were answered. The patient agreed with the plan and demonstrated an understanding of the instructions.   The patient was advised to call back or seek an in-person evaluation if the symptoms worsen or if the condition fails to improve as anticipated.  I provided 55 minutes of non-face-to-face time during this encounter.   [name], LCSW   THERAPIST PROGRESS NOTE  Session Time: 4:30pm-5:25pm  Participation Level: Active  Behavioral Response: CasualAlertEuthymic  Type of Therapy: Individual Therapy  Treatment Goals addressed:  Problem: OP Depression         Dates: Start:  12/27/23           Disciplines: Interdisciplinary, PROVIDER               Goal: LTG: Reduce frequency, intensity, and duration of depression symptoms so that daily functioning is improved     Dates: Start:  12/27/23    Expected End:  06/22/24       Disciplines: Interdisciplinary, PROVIDER                Goal: LTG: Increase coping skills to manage depression and improve ability to perform daily activities     Dates: Start:  12/27/23    Expected End:  06/22/24       Disciplines: Interdisciplinary, PROVIDER                Intervention: Work with Authur Leghorn to track symptoms, triggers, and/or skill use through a mood chart, diary card, or journal     Dates: Start:  12/27/23            ProgressTowards Goals:  Progressing  Interventions: CBT  Summary: Tresten Elwood is a 58 y.o. male who presents with MDD, recurrent, moderate.   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 Clinician utilized CBT to process thoughts, feelings, and interactions. Clinician identified one significant episode of ruminative thoughts following an interaction at work. Clinician processed the experience, noted the thought processes, and explored outcomes. [name] shared he was able to communicate with the person and engaged him to assist on the project. Clinician reflected the disarmament of this colleague and noted that this was a great way to enlist support from a possible foe. Clinician discussed the internalization of stress from these types of challenges. Clinician introduced a mindful way to create an imaginary boundary, so as not to internalize stress from others. [name] shared this will be helpful and was able to create his own metaphor that will work for him.   Plan: Return again in 2 weeks.  Diagnosis: MDD (major depressive disorder), recurrent episode, moderate (HCC)  Collaboration of Care: Medication Management AEB reviewed note from Dr. Carlos [place]. [name] shared he is doing better and feeling more balanced with Abilify   Patient/Guardian was advised Release of Information must be obtained prior to any record release in order to collaborate their care with an outside provider. Patient/Guardian was advised  if they have not already done so to contact the registration department to sign all necessary forms in order for us  to release information regarding their care.   Consent: Patient/Guardian gives verbal consent for treatment and assignment of benefits for services provided during this visit. Patient/Guardian expressed understanding and agreed to proceed.   Merleen Stare [place], LCSW 02/22/2024

## 2024-02-23 ENCOUNTER — Telehealth (HOSPITAL_COMMUNITY): Admitting: Psychiatry

## 2024-03-07 ENCOUNTER — Ambulatory Visit (HOSPITAL_COMMUNITY): Admitting: Licensed Clinical Social Worker

## 2024-03-07 ENCOUNTER — Telehealth (HOSPITAL_COMMUNITY): Payer: Self-pay | Admitting: Licensed Clinical Social Worker

## 2024-03-07 ENCOUNTER — Encounter (HOSPITAL_COMMUNITY): Payer: Self-pay | Admitting: Licensed Clinical Social Worker

## 2024-03-07 DIAGNOSIS — F331 Major depressive disorder, recurrent, moderate: Secondary | ICD-10-CM | POA: Diagnosis not present

## 2024-03-07 NOTE — Telephone Encounter (Signed)
 During session, [place] reported that he continues to feel unable to experience a "normal" amount of joy in his life. He shared that he can focus, but he is still feeling down. Clinician noted that voice was lower than usual and he appeared more flat in affect.   Dr. Arfeen agreed to discontinue Abilify . However, [place] felt uncomfortable and will reduce dose to 1mg . He will report back in the coming weeks.
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
## 2024-04-04 ENCOUNTER — Ambulatory Visit (HOSPITAL_COMMUNITY): Admitting: Licensed Clinical Social Worker

## 2024-04-04 DIAGNOSIS — F331 Major depressive disorder, recurrent, moderate: Secondary | ICD-10-CM

## 2024-04-09 ENCOUNTER — Telehealth (HOSPITAL_COMMUNITY): Payer: Self-pay | Admitting: Psychiatry

## 2024-04-09 NOTE — Telephone Encounter (Signed)
 Patient called requesting 20 mg of Lexapro . Patient's next appointment is July 8.

## 2024-04-09 NOTE — Telephone Encounter (Signed)
 Patient reported on 04/03/2024 that he was not taking any of his psych medication. Dr. Arfeen no longer gives him Lexapro  and at this time he should wait for his appointment. I called the patient and left a voicemail for him to call me back to discuss.

## 2024-04-13 NOTE — Progress Notes (Signed)
 Patient:Jerry Thornton    DOB: Mar 20, 1966, age 58 y.o. MRN: [id number]  PCP: Bernita Ned, PA-C    Assessment and Plan   1. Severe episode of recurrent major depressive disorder, without psychotic features (*) (Primary) -     Comprehensive Metabolic Panel; Future; Expected date: 04/20/2024 -     TSH+Free T4; Future; Expected date: 04/20/2024 -     Vitamin B12 and Folate; Future -     Vitamin D 25 Hydroxy; Future; Expected date: 04/20/2024 2. Generalized anxiety disorder 3. Syncope and collapse -     CBC And Differential; Future; Expected date: 04/20/2024 -     Comprehensive Metabolic Panel; Future; Expected date: 04/20/2024 -     TSH+Free T4; Future; Expected date: 04/20/2024 -     Vitamin B12 and Folate; Future -     Vitamin D 25 Hydroxy; Future; Expected date: 04/20/2024 4. Lightheadedness 5. Fatigue due to depression -     Vitamin B12 and Folate; Future 6. Work stress   Assessment & Plan 1. Severe major depressive disorder. - Increased symptoms of depression, including fatigue and negative thought patterns. - Recently restarted Lexapro  10 mg and increased the dose to 20 mg this morning. - Recommended to reduce Lexapro  back to 10 mg and maintain this dosage for a few weeks due to previous adverse effects at higher doses. - Complete blood count, metabolic panel, thyroid function tests, B12, and vitamin D levels will be ordered to rule out physiological causes for symptoms. Appointment with psychiatrist on 04/24/2024 for further management.  2. Generalized anxiety. - Increased anxiety, particularly related to work stress. - Potential use of Wellbutrin discussed, decision to hold off on new medications until after upcoming psychiatry appointment. - Risks and benefits of medications like BuSpar and Ativan  discussed; expressed disinterest due to past experiences. - Counseling provided on managing anxiety and stress.  3. Syncope. - Experienced syncopal episode at work two days  ago, likely related to mental state, medication changes, and low blood sugar due to intermittent fasting. - EKG normal, blood sugar 80. - Recommended to monitor hydration and food intake to prevent future episodes. - Advised to seek immediate medical attention if symptoms worsen.  4. Lightheadedness. - Continues to experience lightheadedness following syncopal episode. - Expected to improve with time. - Ordered blood tests to rule out anemia or electrolyte imbalances. - Advised to rest and avoid strenuous activities until symptoms resolve.  5. Fatigue due to depression. - Fatigue likely related to severe major depressive disorder. - Reduction in Lexapro  dosage to 10 mg expected to help alleviate fatigue. - Further management to be discussed with psychiatrist during upcoming appointment. - Encouraged to maintain regular sleep schedule and engage in light physical activity.   Follow up in about 18 days (around 05/01/2024) for Recheck of mental health .    Patient's Medications       * Accurate as of April 13, 2024 11:59 PM. Reflects encounter med changes as of last refresh          Continued Medications      Instructions  erythromycin ophthalmic ointment Commonly known as: ILOTYCIN  1 Application, Ophthalmic, Every 6 hours   multivitamin tablet  1 tablet, Daily       Modified Medications      Instructions  escitalopram  oxalate 20 mg tablet Commonly known as: LEXAPRO  What changed: Another medication with the same name was removed. Continue taking this medication, and follow the directions you see here. Changed by: Penne Pass, PA-C  20 mg, Daily        Risks, benefits, and alternatives of the medications and treatment plan prescribed today were discussed and patient expressed understanding. Plan follow-up as discussed or as needed if any worsening symptoms or change in condition. Patient voiced understanding and agreed to attempt to adhere to the treatment  plan.      Subjective   This a 58 y.o. male who presents with:     Patient presents with  . Dizziness    Fainted at work-2 days ago-did not hit head, denies headaches positive for fatigue, restarted Lexapro  1 weeks ago after being off of it for several months      Chief complaint comments reviewed and discussed with patient in detail.  History of Present Illness The patient is a 58 year old male presenting today for dizziness following an episode of syncope 2 days ago. He is accompanied by his wife, who has expressed concerns about his mental health.  Two days ago, he experienced a syncopal episode at work, during which he lost consciousness and fell. Prior to the episode, he had been feeling warm, perspiring slightly, and experiencing racing thoughts. He attempted to alleviate these symptoms by getting a cup of coffee and some fresh air but was unable to do so due to the onset of dizziness. He reports no injuries from the fall. His EKG was normal, and his blood sugar level was 80. He has been practicing intermittent fasting and exercising in an effort to lose weight. Following the episode, he rested for the remainder of the day, consuming Gatorade, water , salt, chicken, and rice. The following day, he continued to hydrate and perform light housework but has not felt well since the episode. He reports feeling extremely fatigued and has been struggling with depression. He also reports occasional hot flashes and sweating, sometimes associated with work-related stress.  He has been experiencing difficulty sleeping and occasional awakenings at night. He reports no shortness of breath, chest pain, numbness, weakness, nausea, or stomach issues. He does report diarrhea following the syncopal episode. He reports no respiratory symptoms such as congestion or coughing. He continues to feel very tired and has been experiencing lightheadedness. He has not noticed any changes in his mentality or personality.  He has been under significant stress due to work and has been experiencing intrusive negative thoughts. He has a history of slow wound healing.  He has a history of major depression with psychotic features and was hospitalized in [place] in 10/2023. He has been hospitalized multiple times in the past for suicidal ideations. He was previously on Lexapro  but discontinued it due to gastrointestinal issues. He has been off all medications for about a month. He has an appointment with his psychiatrist on 04/24/2024. He recently resumed taking Lexapro  10 mg and increased the dose to 20 mg this morning. He has a history of taking Wellbutrin and is considering resuming it. He has an appointment with a new psychiatrist on 04/26/2024. He has tried Klonopin and Ativan  in the past but did not find them helpful. He was previously on Abilify  and Trileptal  but discontinued them as they were not effective.  FAMILY HISTORY - Sister is hypothyroid  PHQ9       04/13/2024 07/01/2023 06/30/2022  Depression Screen  Please choose the category that best describes the patient's current state 2 1 2 2   Not eligible on the basis of Not applicable Not applicable Not applicable Not applicable  1. Little interest or pleasure in doing things 3  0 0 0  2. Feeling down, depressed, or hopeless 3 0 0 0  PHQ-2 Total Score 6 0 0 0  PHQ-2 Interpretation of Score for Depression (Payor) - Negative Negative Negative  3. Trouble falling or staying asleep 3 0 0 0  4. Feeling tired or having little energy 3 0 0 0  5. Poor appetite or overeating 3 0 0 0  6. Feeling bad about yourself - or that you are a failure or have let yourself or your family down 3 0 0 1  7. Trouble concentrating on things, such as reading the newspaper or watching television - 0 0 0  8. Moving or speaking so slowly that other people could have noticed.  Or the opposite - being so fidgety or restless that you have been moving around a lot more than usual. 3 0 0 0  9.  Thoughts that you would be better off dead, or of hurting yourself in some way. 1 0 0  10. How difficult have these problems made it for you to do your work, take care of things at home or get along with other people? Very difficult Not difficult at all Not difficult at all  PHQ Total Score - 0 1  Interpretation: - Minimal or No Depression Minimal or No Depression  PHQ-9 Interpretation of Score for Depression (Payor) - Negative Negative    Details       Multiple values from one day are sorted in reverse-chronological order            Reviewed and updated this visit by provider: Tobacco  Allergies  Meds  Problems  Med Hx  Surg Hx  Fam Hx         Review of Systems:  As per HPI.  All other systems reviewed and are negative.     Objective   Vitals:   04/13/24 1050  BP: 127/84  Pulse: 60  Temp: 98.1 F (36.7 C)  TempSrc: Temporal  Resp: 16  Height: 6' 3 (1.905 m)  Weight: 223 lb (101.2 kg)  SpO2: 98%  BMI (Calculated): 27.9    Physical Exam Vitals reviewed.  Constitutional:      General: He is not in acute distress.    Appearance: Normal appearance. He is not toxic-appearing.  HENT:     Head: Normocephalic and atraumatic.  Eyes:     Extraocular Movements: Extraocular movements intact.     Conjunctiva/sclera: Conjunctivae normal.     Pupils: Pupils are equal, round, and reactive to light.  Cardiovascular:     Rate and Rhythm: Normal rate and regular rhythm.     Heart sounds: Normal heart sounds.  Pulmonary:     Effort: Pulmonary effort is normal. No respiratory distress.     Breath sounds: Normal breath sounds. No wheezing, rhonchi or rales.  Skin:    General: Skin is warm and dry.  Neurological:     Mental Status: He is alert and oriented to person, place, and time. Mental status is at baseline.  Psychiatric:        Attention and Perception: Attention normal.        Mood and Affect: Mood normal.        Speech: Speech normal.        Behavior:  Behavior normal.        Thought Content: Thought content normal. Thought content is not paranoid.      Documentation for time-based billing:  Total time spent of date of service was  50 minutes.  Patient care activities included preparing to see the patient such as reviewing the patient record, obtaining and/or reviewing separately obtained history, performing a medically appropriate history and physical examination, counseling and educating the patient, family, and/or caregiver, ordering prescription medications, tests, or procedures, documenting clinical information in the electronic or other health record, independently interpreting results when not separately reported, communicating results to the patient/family/caregiver, and coordinating the care of the patient when not separately reported.           Brandon McCoy, PA-C      *Some images could not be shown.

## 2024-04-15 ENCOUNTER — Encounter (HOSPITAL_COMMUNITY): Payer: Self-pay | Admitting: Licensed Clinical Social Worker

## 2024-04-15 NOTE — Progress Notes (Signed)
 Virtual Visit via Video Note  I connected with Marsa Hills on 04/04/24 at  4:30 PM EDT by a video enabled telemedicine application and verified that I am speaking with the correct person using two identifiers.  Location: Patient: home in [place] Provider: home office   I discussed the limitations of evaluation and management by telemedicine and the availability of in person appointments. The patient expressed understanding and agreed to proceed.   I discussed the assessment and treatment plan with the patient. The patient was provided an opportunity to ask questions and all were answered. The patient agreed with the plan and demonstrated an understanding of the instructions.   The patient was advised to call back or seek an in-person evaluation if the symptoms worsen or if the condition fails to improve as anticipated.  I provided 55 minutes of non-face-to-face time during this encounter.   [name], LCSW   THERAPIST PROGRESS NOTE  Session Time: 4:30pm-5:25pm  Participation Level: Active  Behavioral Response: NeatAlertDepressed  Type of Therapy: Individual Therapy  Treatment Goals addressed:  Problem: OP Depression         Dates: Start:  12/27/23           Disciplines: Interdisciplinary, PROVIDER               Goal: LTG: Reduce frequency, intensity, and duration of depression symptoms so that daily functioning is improved     Dates: Start:  12/27/23    Expected End:  06/22/24       Disciplines: Interdisciplinary, PROVIDER                Goal: LTG: Increase coping skills to manage depression and improve ability to perform daily activities     Dates: Start:  12/27/23    Expected End:  06/22/24       Disciplines: Interdisciplinary, PROVIDER                Intervention: Work with Marsa to track symptoms, triggers, and/or skill use through a mood chart, diary card, or journal     Dates: Start:  12/27/23            ProgressTowards Goals:  Progressing  Interventions: CBT  Summary: Sukhman Martine is a 58 y.o. male who presents with MDD, moderate.   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 Clinician utilized CBT to process triggers, thoughts, feelings, and behaviors. Clinician explored updates in life as well as moods. Clinician reflected low mood and lack of motivation. Clinician reviewed medications and identified that Dr. Arfeen had removed SSRI until next appt. Clinician explored coping skills, continuing to exercise daily, socializing, feeling more confident and competent at work. Clinician explored thoughts, which continue to be negative and low, without psychosis. Clinician utilized reality testing and provided feedback about reality testing for himself.    Plan: Return again in 1-2 weeks.  Diagnosis: MDD (major depressive disorder), recurrent episode, moderate (HCC)  Collaboration of Care: Psychiatrist AEB updated Dr. Curry about concerns with medication  Patient/Guardian was advised Release of Information must be obtained prior to any record release in order to collaborate their care with an outside provider. Patient/Guardian was advised if they have not already done so to contact the registration department to sign all necessary forms in order for us  to release information regarding their care.   Consent: Patient/Guardian gives verbal consent for treatment and assignment of benefits for services provided during this visit. Patient/Guardian expressed understanding  and agreed to proceed.   Harlene SAUNDERS [place], LCSW 04/15/2024

## 2024-04-18 ENCOUNTER — Ambulatory Visit (HOSPITAL_COMMUNITY): Admitting: Licensed Clinical Social Worker

## 2024-04-18 DIAGNOSIS — F331 Major depressive disorder, recurrent, moderate: Secondary | ICD-10-CM

## 2024-04-19 ENCOUNTER — Encounter (HOSPITAL_COMMUNITY): Payer: Self-pay | Admitting: Licensed Clinical Social Worker

## 2024-04-19 NOTE — Progress Notes (Signed)
 Virtual Visit via Video Note  I connected with Jerry Thornton on 04/18/24 at  4:30 PM EDT by a video enabled telemedicine application and verified that I am speaking with the correct person using two identifiers.  Location: Patient: home Provider: home office   I discussed the limitations of evaluation and management by telemedicine and the availability of in person appointments. The patient expressed understanding and agreed to proceed.   I discussed the assessment and treatment plan with the patient. The patient was provided an opportunity to ask questions and all were answered. The patient agreed with the plan and demonstrated an understanding of the instructions.   The patient was advised to call back or seek an in-person evaluation if the symptoms worsen or if the condition fails to improve as anticipated.  I provided 55 minutes of non-face-to-face time during this encounter.   Jerry JONELLE Rosser, LCSW   THERAPIST PROGRESS NOTE  Session Time: 4:30pm-5:25pm  Participation Level: Active  Behavioral Response: CasualAlertEuthymic  Type of Therapy: Individual Therapy  Treatment Goals addressed:  Problem: OP Depression         Dates: Start:  12/27/23           Disciplines: Interdisciplinary, PROVIDER               Goal: LTG: Reduce frequency, intensity, and duration of depression symptoms so that daily functioning is improved     Dates: Start:  12/27/23    Expected End:  06/22/24       Disciplines: Interdisciplinary, PROVIDER                Goal: LTG: Increase coping skills to manage depression and improve ability to perform daily activities     Dates: Start:  12/27/23    Expected End:  06/22/24       Disciplines: Interdisciplinary, PROVIDER                Intervention: Work with Jerry to track symptoms, triggers, and/or skill use through a mood chart, diary card, or journal     Dates: Start:  12/27/23            ProgressTowards Goals:  Progressing  Interventions: CBT  Summary: Jerry Thornton is a 58 y.o. male who presents with MDD, recurrent, moderate.   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 Clinician utilized CBT to process triggers, thoughts, feelings, and behaviors. Clinician processed incident last Wednesday (04/11/24) where Jerry Thornton experienced a vasovagal syncope and fainted at work. Clinician processed the possible factors that led up to this incident, including high stress, low blood sugar, just started taking Lexapro  2 days before, and panic. Clinician explored updates with medication and noted that he contacted his GP about going back on Lexapro  10mg , as it has been helpful in the past. Clinician explored current functioning with Lexapro , which is now 11 days in his system. Jerry Thornton shared he is feeling so much better, not thinking and stressing all the time, not having paranoid thoughts, and able to just be present. Clinician processed challenges with med management in the past and explored reasons why Lexapro  had been d/c'd in the past. Jerry Thornton shared he took himself off due to feeling too lethargic and not enough energy to practice his more intense job. Jerry Thornton shared that now new job is much more calm and he does not need to be so intense.   Plan: Return again in 3 weeks.  Diagnosis: MDD (major depressive disorder),  recurrent episode, moderate (HCC)  Collaboration of Care: Psychiatrist AEB Jerry Thornton will see Dr. Curry next week. Clinician notified Dr. Arfeen that Jerry Thornton is now taking Lexapro  10 mg as prescribed by GP.  Patient/Guardian was advised Release of Information must be obtained prior to any record release in order to collaborate their care with an outside provider. Patient/Guardian was advised if they have not already done so to contact the registration department to sign all necessary forms in order for us  to release information regarding  their care.   Consent: Patient/Guardian gives verbal consent for treatment and assignment of benefits for services provided during this visit. Patient/Guardian expressed understanding and agreed to proceed.   Jerry SAUNDERS [name], LCSW 04/19/2024

## 2024-04-24 ENCOUNTER — Telehealth (HOSPITAL_COMMUNITY): Admitting: Psychiatry

## 2024-04-24 ENCOUNTER — Encounter (HOSPITAL_COMMUNITY): Payer: Self-pay | Admitting: Psychiatry

## 2024-04-24 VITALS — Wt 223.0 lb

## 2024-04-24 DIAGNOSIS — F323 Major depressive disorder, single episode, severe with psychotic features: Secondary | ICD-10-CM | POA: Diagnosis not present

## 2024-04-24 DIAGNOSIS — F419 Anxiety disorder, unspecified: Secondary | ICD-10-CM | POA: Diagnosis not present

## 2024-04-24 MED ORDER — ESCITALOPRAM OXALATE 10 MG PO TABS: 10.0000 mg | ORAL_TABLET | Freq: Every day | ORAL | 0 refills | Status: DC

## 2024-04-24 NOTE — Progress Notes (Signed)
 [facility] MD Virtual Progress Note   Patient Location: Home in [place]  Provider Location: Home Office  I connect with patient by video and verified that I am speaking with correct person by using two identifiers. I discussed the limitations of evaluation and management by telemedicine and the availability of in person appointments. I also discussed with the patient that there may be a patient responsible charge related to this service. The patient expressed understanding and agreed to proceed.  Jerry Thornton [id number] 58 y.o.  04/24/2024 11:30 AM  History of Present Illness:  Patient evaluated by video session.  He is currently in [place]  visiting family.  Patient reported he stopped taking the Abilify  and Trileptal  but started to feel sad and depressed and his PCP started him on Lexapro  10 mg and then moved to 20 mg in few days.  He had a syncopal episode and he was passed out at work.  He was very concerned about the incident.  He saw therapist and called our office.  He is now taking Lexapro  10 mg which seems to be tolerating very well.  Patient has a history of not following the recommendation.  He stopped taking the medication few months ago and admitted in January 2025.  He was discharged on multiple medication but started to cut down due to side effects and last visit like to further cut down his Abilify .  We resumed Abilify  2 mg and keep the Trileptal  but he stopped both medication and again started feeling sad and depressed until PCP provided Lexapro .  Patient told me realize that he need to take the medication and so far Lexapro  had helped him most of the time and his plan is to keep the Lexapro .  He feels the medicine working and he does not feel as sad or depressed.  He also changed his job in January and he has flexibility to work from home 2 days a week.  He denies any to have a balance between his job and his mental health.  He admitted sometimes  ruminative thoughts and anxiety about the future but denies any hallucination, paranoia, suicidal thoughts or homicidal thoughts.  He really very concerned after the syncopal episode and now like to follow the recommendations from his provider.  He is in therapy with Harlene.  He sleeps okay.  His appetite is okay.  His weight is stable.  He has 2 children who lives out of town.  His wife is very supportive.  Patient currently in [place]  but his plan is to move back to [place]  coming weekend.  Denies any tremors, shakes or any EPS.  He is compliant with Lexapro  10 mg and denies any new side effects.  He has no further syncopal episode.  He has labs at PCP office.  Numbers are normal other than low vitamin D and mildly elevated calcium.  He is taking over-the-counter vitamin D.  He is no longer taking any antibiotic or pain medicine.    Past Psychiatric History: H/O depression, psychosis. Last inpatient at [facility] in January 2025.  Did IOP.  H/O inpatient in 2017 and in 1999 due to psychosis and depression. Given Wellbutrin, Depakote, Abilify , trazodone  and Ativan  but d/c due to side effects and weight gain. No history of suicidal attempt.  Lexapro  worked for a while until discontinued on his own.    Outpatient Encounter Medications as of 04/24/2024  Medication Sig   ARIPiprazole  (ABILIFY ) 2 MG tablet Take 1  tablet (2 mg total) by mouth daily.   cloNIDine (CATAPRES - DOSED IN MG/24 HR) 0.1 mg/24hr patch 0.1 mg once a week.   escitalopram  (LEXAPRO ) 10 MG tablet Take 1 tablet (10 mg total) by mouth daily.   Omega-3 Fatty Acids (FISH OIL PO) Take 1 capsule by mouth daily. Reported on 04/26/2016   Oxcarbazepine  (TRILEPTAL ) 300 MG tablet Take 1 tablet (300 mg total) by mouth daily.   No facility-administered encounter medications on file as of 04/24/2024.    No results found for this or any previous visit (from the past 2160 hours).   Psychiatric Specialty Exam: Physical Exam  Review  of Systems  Weight 223 lb (101.2 kg).There is no height or weight on file to calculate BMI.  General Appearance: Casual  Eye Contact:  Good  Speech:  Slow  Volume:  Decreased  Mood:  Euthymic  Affect:  Congruent  Thought Process:  Goal Directed  Orientation:  Full (Time, Place, and Person)  Thought Content:  Rumination  Suicidal Thoughts:  No  Homicidal Thoughts:  No  Memory:  Immediate;   Good Recent;   Good Remote;   Good  Judgement:  Fair  Insight:  Shallow  Psychomotor Activity:  Decreased  Concentration:  Concentration: Good and Attention Span: Good  Recall:  Good  Fund of Knowledge:  Good  Language:  Good  Akathisia:  No  Handed:  Right  AIMS (if indicated):     Assets:  Communication Skills Desire for Improvement Financial Resources/Insurance Housing Resilience Social Support Talents/Skills Transportation  ADL's:  Intact  Cognition:  WNL  Sleep:  ok       11/22/2023    9:53 AM 01/06/2018   11:38 AM 11/15/2016    8:59 AM 06/24/2015   10:27 AM 06/24/2015   10:26 AM  Depression screen PHQ 2/9  Decreased Interest 1 0 3 0 0  Down, Depressed, Hopeless 1 0 3 0 0  PHQ - 2 Score 2 0 6 0 0  Altered sleeping 1  3    Tired, decreased energy 1  3    Change in appetite 1  1    Feeling bad or failure about yourself  2  3    Trouble concentrating 2  3    Moving slowly or fidgety/restless 2  3    Suicidal thoughts 2  3     PHQ-9 Score 13  25    Difficult doing work/chores Very difficult  Extremely dIfficult       Data saved with a previous flowsheet row definition    Assessment/Plan: MDD (major depressive disorder), single episode, severe with psychotic features (HCC) - Plan: escitalopram  (LEXAPRO ) 10 MG tablet  Anxiety - Plan: escitalopram  (LEXAPRO ) 10 MG tablet  Patient is 58 year old man with history of severe depression, anxiety, multiple hospitalization for psychiatric illness and recently had a syncopal episode.  He is not taking Abilify  and Trileptal  which he  stopped on his own.  Discussed in length about risk of relapse has not taken the medication properly.  Patient admitted he does his own medication compliance which may not be a good idea.  Now he like to keep the Lexapro  10 mg which had helped in the past and so far tolerating with the help and reported no side effects.  He has chronic anxiety and dysphoria but overall depression is stable.  Will consider increasing the dose of the Lexapro  in the future if needed.  Patient like to have a follow-up in 4  to 6 weeks.  I encouraged to continue therapy with Harlene.  I also reviewed blood work results from his PCP and other collect information from other provider.  Encouraged to continue vitamin D and multivitamin.  Encourage walking and hydration.  Will follow-up in 4 to 6 weeks.  Encouraged to call back if there is any question or any concern.   Follow Up Instructions:     I discussed the assessment and treatment plan with the patient. The patient was provided an opportunity to ask questions and all were answered. The patient agreed with the plan and demonstrated an understanding of the instructions.   The patient was advised to call back or seek an in-person evaluation if the symptoms worsen or if the condition fails to improve as anticipated.    Collaboration of Care: Other provider involved in patient's care AEB notes are available in epic to review  Patient/Guardian was advised Release of Information must be obtained prior to any record release in order to collaborate their care with an outside provider. Patient/Guardian was advised if they have not already done so to contact the registration department to sign all necessary forms in order for us  to release information regarding their care.   Consent: Patient/Guardian gives verbal consent for treatment and assignment of benefits for services provided during this visit. Patient/Guardian expressed understanding and agreed to proceed.     Total  encounter time 28 minutes which includes face-to-face time, chart reviewed, care coordination, order entry and documentation during this encounter.   Note: This document was prepared by [employer] voice dictation technology and any errors that results from this process are unintentional.    Leni ONEIDA Client, MD 04/24/2024

## 2024-05-16 ENCOUNTER — Encounter (HOSPITAL_COMMUNITY): Payer: Self-pay | Admitting: Licensed Clinical Social Worker

## 2024-05-16 ENCOUNTER — Ambulatory Visit (INDEPENDENT_AMBULATORY_CARE_PROVIDER_SITE_OTHER): Admitting: Licensed Clinical Social Worker

## 2024-05-16 DIAGNOSIS — F330 Major depressive disorder, recurrent, mild: Secondary | ICD-10-CM

## 2024-05-16 NOTE — Progress Notes (Signed)
 Virtual Visit via Video Note  I connected with Jerry Thornton on 05/16/24 at  4:30 PM EDT by a video enabled telemedicine application and verified that I am speaking with the correct person using two identifiers.  Location: Patient: home Provider: home office   I discussed the limitations of evaluation and management by telemedicine and the availability of in person appointments. The patient expressed understanding and agreed to proceed.   I discussed the assessment and treatment plan with the patient. The patient was provided an opportunity to ask questions and all were answered. The patient agreed with the plan and demonstrated an understanding of the instructions.   The patient was advised to call back or seek an in-person evaluation if the symptoms worsen or if the condition fails to improve as anticipated.  I provided 55 minutes of non-face-to-face time during this encounter.   [name], LCSW   THERAPIST PROGRESS NOTE  Session Time: 4:30pm-5:25pm  Participation Level: Active  Behavioral Response: Well GroomedAlertEuthymic  Type of Therapy: Individual Therapy  Treatment Goals addressed:  Problem: OP Depression         Dates: Start:  12/27/23           Disciplines: Interdisciplinary, PROVIDER               Goal: LTG: Reduce frequency, intensity, and duration of depression symptoms so that daily functioning is improved     Dates: Start:  12/27/23    Expected End:  06/22/24       Disciplines: Interdisciplinary, PROVIDER                Goal: LTG: Increase coping skills to manage depression and improve ability to perform daily activities     Dates: Start:  12/27/23    Expected End:  06/22/24       Disciplines: Interdisciplinary, PROVIDER                Intervention: Work with Jerry to track symptoms, triggers, and/or skill use through a mood chart, diary card, or journal     Dates: Start:  12/27/23         ProgressTowards Goals:  Progressing  Interventions: Motivational Interviewing  Summary: Jerry Thornton is a 58 y.o. male who presents with MDD, recurrent, mild.   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 Clinician utilized MI OARS to reflect and summarize thoughts, feelings and interactions. Clinician processed last visit with Dr. Curry and identified that mood has greatly improved since starting Lexapro  again. Clinician discussed work stressors and noted the importance of valuing his own mental health over his job. Clinician processed ethical dilemmas at work and his struggle to meet the expectations of the company. 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 Jerry Thornton shared stability in home life, some concerns about children, but overall good health physically and emotionally.   Plan: Return again in 2 weeks.  Diagnosis: MDD (major depressive disorder), recurrent episode, mild (HCC)  Collaboration of Care: Psychiatrist AEB reviewed last note from Dr. Curry  Patient/Guardian was advised Release of Information must be obtained prior to any record release in order to collaborate their care with an outside provider. Patient/Guardian was advised if they have not already done so to contact the registration department to sign all necessary forms in order for us  to release information regarding their care.   Consent: Patient/Guardian gives verbal consent for  treatment and assignment of benefits for services provided during this visit. Patient/Guardian expressed understanding and agreed to proceed.   Harlene SAUNDERS [place], LCSW 05/16/2024

## 2024-05-31 ENCOUNTER — Encounter (HOSPITAL_COMMUNITY): Payer: Self-pay | Admitting: Licensed Clinical Social Worker

## 2024-05-31 ENCOUNTER — Ambulatory Visit (HOSPITAL_COMMUNITY): Admitting: Licensed Clinical Social Worker

## 2024-05-31 DIAGNOSIS — F330 Major depressive disorder, recurrent, mild: Secondary | ICD-10-CM | POA: Diagnosis not present

## 2024-05-31 NOTE — Progress Notes (Signed)
 Virtual Visit via Video Note  I connected with Jerry Thornton on 05/31/24 at  8:00 AM EDT by a video enabled telemedicine application and verified that I am speaking with the correct person using two identifiers.  Location: Patient: home Provider: home office   I discussed the limitations of evaluation and management by telemedicine and the availability of in person appointments. The patient expressed understanding and agreed to proceed.   I discussed the assessment and treatment plan with the patient. The patient was provided an opportunity to ask questions and all were answered. The patient agreed with the plan and demonstrated an understanding of the instructions.   The patient was advised to call back or seek an in-person evaluation if the symptoms worsen or if the condition fails to improve as anticipated.  I provided 55 minutes of non-face-to-face time during this encounter.   Jerry JONELLE Rosser, LCSW   THERAPIST PROGRESS NOTE  Session Time: 8:00am-8:55am  Participation Level: Active  Behavioral Response: CasualAlertEuthymic  Type of Therapy: Individual Therapy  Treatment Goals addressed:  Problem: OP Depression         Dates: Start:  12/27/23           Disciplines: Interdisciplinary, PROVIDER               Goal: LTG: Reduce frequency, intensity, and duration of depression symptoms so that daily functioning is improved     Dates: Start:  12/27/23    Expected End:  06/22/24       Disciplines: Interdisciplinary, PROVIDER                Goal: LTG: Increase coping skills to manage depression and improve ability to perform daily activities     Dates: Start:  12/27/23    Expected End:  06/22/24       Disciplines: Interdisciplinary, PROVIDER                Intervention: Work with Jerry to track symptoms, triggers, and/or skill use through a mood chart, diary card, or journal     Dates: Start:  12/27/23         ProgressTowards Goals:  Progressing  Interventions: CBT  Summary: Jerry Thornton is a 58 y.o. male who presents with MDD, recurrent, mild.   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 Clinician utilized CBT to process thoughts, feelings, and interactions. Clinician explored the thought processes and interactions at work. Clinician noted the challenges when values and expectations are in opposition. Clinician discussed coping skills and reality testing about what he will and will not do. Clinician also identified the importance of maintaining ethical practice not only to protect his license, but to set expectations others have of him.  Clinician explored mental health status and noted that he has not had to think about his mental health, which is excellent. Current medication Lexapro  10mg  has been effective in managing anxiety, daily exercise and taking time for self care has been really helpful. Jerry Thornton shared short term planning goals for his future, month to month, year to year. Clinician also identified the usefulness of having future plans to look forward to, such as a vacation, tickets to an event, or a class. Clinician provided psychoeducation about dopamine boost that occurs with anticipation of something great.   Plan: Return again in 2 weeks.  Diagnosis: MDD (major depressive disorder), recurrent episode, mild (HCC)  Collaboration of Care: Psychiatrist AEB updated Dr. Curry. Jerry Thornton will see him  next week. Jerry Thornton is doing very well. Stable mood, stable behavior.   Patient/Guardian was advised Release of Information must be obtained prior to any record release in order to collaborate their care with an outside provider. Patient/Guardian was advised if they have not already done so to contact the registration department to sign all necessary forms in order for us  to release information regarding their care.   Consent: Patient/Guardian  gives verbal consent for treatment and assignment of benefits for services provided during this visit. Patient/Guardian expressed understanding and agreed to proceed.   Jerry SAUNDERS [place], LCSW 05/31/2024

## 2024-06-05 ENCOUNTER — Encounter (HOSPITAL_COMMUNITY): Payer: Self-pay | Admitting: Psychiatry

## 2024-06-05 ENCOUNTER — Telehealth (HOSPITAL_COMMUNITY): Admitting: Psychiatry

## 2024-06-05 DIAGNOSIS — F419 Anxiety disorder, unspecified: Secondary | ICD-10-CM

## 2024-06-05 DIAGNOSIS — F323 Major depressive disorder, single episode, severe with psychotic features: Secondary | ICD-10-CM | POA: Diagnosis not present

## 2024-06-05 MED ORDER — ESCITALOPRAM OXALATE 10 MG PO TABS: 10.0000 mg | ORAL_TABLET | Freq: Every day | ORAL | 0 refills | Status: DC

## 2024-06-05 NOTE — Progress Notes (Signed)
 [facility] MD Virtual Progress Note   Patient Location: Work Provider Location: Home Office  I connect with patient by video and verified that I am speaking with correct person by using two identifiers. I discussed the limitations of evaluation and management by telemedicine and the availability of in person appointments. I also discussed with the patient that there may be a patient responsible charge related to this service. The patient expressed understanding and agreed to proceed.  Jerry Thornton [id number] 58 y.o.  06/05/2024 11:43 AM  History of Present Illness:  Patient is evaluated by video session.  He is at work.  He is doing much better with Lexapro  10 mg and so far no major concern, side effects, tremors or shakes.  He do not have any further episodes of syncope.  He reported his mood is much better and he is more active.  He like the current job timing because he can work 1-1/2-day from home and that helps him a lot.  His job is in [place].  Denies any crying spells or any feeling of hopelessness or worthlessness.  He feels good about himself and is very active and trying to lose weight.  He lost 10 pounds since the last visit.  He reported family life is good.  He is in therapy with Harlene.  He sleeps good.  He has no tremors, shakes or any EPS.  He does not feel tired since he moved Lexapro  taking in the evening.  He is taking vitamin D and multivitamins.  He denies any hallucination or any paranoia and like to keep the current dose of Lexapro .  He denies any panic attack.  Past Psychiatric History: H/O depression, psychosis. Last inpatient at [facility] in January 2025.  Discharged on Trileptal  and Abilify .  Did IOP.  H/O inpatient in 2017 and in 1999 due to psychosis and depression. Given Wellbutrin, Depakote, Abilify , trazodone  and Ativan  but d/c due to side effects and weight gain. No history of suicidal attempt.  Lexapro  worked for a while until discontinued on  his own.   Past Medical History:  Diagnosis Date   Allergy    Anxiety    Depression     Outpatient Encounter Medications as of 06/05/2024  Medication Sig   escitalopram  (LEXAPRO ) 10 MG tablet Take 1 tablet (10 mg total) by mouth daily.   No facility-administered encounter medications on file as of 06/05/2024.    No results found for this or any previous visit (from the past 2160 hours).   Psychiatric Specialty Exam: Physical Exam  Review of Systems  Weight 210 lb (95.3 kg).There is no height or weight on file to calculate BMI.  General Appearance: Casual  Eye Contact:  Good  Speech:  Clear and Coherent and Normal Rate  Volume:  Normal  Mood:  Euthymic  Affect:  Appropriate  Thought Process:  Goal Directed  Orientation:  Full (Time, Place, and Person)  Thought Content:  Logical  Suicidal Thoughts:  No  Homicidal Thoughts:  No  Memory:  Immediate;   Good Recent;   Good Remote;   Good  Judgement:  Good  Insight:  Present  Psychomotor Activity:  Normal  Concentration:  Concentration: Good and Attention Span: Good  Recall:  Good  Fund of Knowledge:  Good  Language:  Good  Akathisia:  No  Handed:  Right  AIMS (if indicated):     Assets:  Communication Skills Desire for Improvement Physical Health Resilience Social Support Talents/Skills Transportation  ADL's:  Intact  Cognition:  WNL  Sleep:  ok       06/05/2024   11:54 AM 11/22/2023    9:53 AM 01/06/2018   11:38 AM 11/15/2016    8:59 AM 06/24/2015   10:27 AM  Depression screen PHQ 2/9  Decreased Interest 0 1 0 3 0  Down, Depressed, Hopeless 0 1 0 3 0  PHQ - 2 Score 0 2 0 6 0  Altered sleeping  1  3   Tired, decreased energy  1  3   Change in appetite  1  1   Feeling bad or failure about yourself   2  3   Trouble concentrating  2  3   Moving slowly or fidgety/restless  2  3   Suicidal thoughts  2  3    PHQ-9 Score  13  25   Difficult doing work/chores  Very difficult  Extremely dIfficult      Data saved  with a previous flowsheet row definition      06/05/2024   11:54 AM  GAD 7 : Generalized Anxiety Score  Nervous, Anxious, on Edge 1  Control/stop worrying 0  Worry too much - different things 1  Trouble relaxing 0  Restless 0  Easily annoyed or irritable 0  Afraid - awful might happen 0  Total GAD 7 Score 2  Anxiety Difficulty Not difficult at all      Assessment/Plan: Anxiety - Plan: escitalopram  (LEXAPRO ) 10 MG tablet  MDD (major depressive disorder), single episode, severe with psychotic features (HCC) - Plan: escitalopram  (LEXAPRO ) 10 MG tablet  Patient is a 58 year old man with history of severe depression, anxiety with multiple hospitalization currently stable on Lexapro  10 mg.  He realize he need to continue his medication to keep him is stable.  Encouraged to continue therapy with Harlene.  Recommended to call back if is any question or any concern.  Follow-up in 4 months.  Follow Up Instructions:     I discussed the assessment and treatment plan with the patient. The patient was provided an opportunity to ask questions and all were answered. The patient agreed with the plan and demonstrated an understanding of the instructions.   The patient was advised to call back or seek an in-person evaluation if the symptoms worsen or if the condition fails to improve as anticipated.    Collaboration of Care: Other provider involved in patient's care AEB notes are available in epic to review  Patient/Guardian was advised Release of Information must be obtained prior to any record release in order to collaborate their care with an outside provider. Patient/Guardian was advised if they have not already done so to contact the registration department to sign all necessary forms in order for us  to release information regarding their care.   Consent: Patient/Guardian gives verbal consent for treatment and assignment of benefits for services provided during this visit. Patient/Guardian  expressed understanding and agreed to proceed.     Total encounter time 18 minutes which includes face-to-face time, chart reviewed, care coordination, order entry and documentation during this encounter.   Note: This document was prepared by [employer] voice dictation technology and any errors that results from this process are unintentional.    Leni ONEIDA Client, MD 06/05/2024

## 2024-06-14 ENCOUNTER — Encounter (HOSPITAL_COMMUNITY): Payer: Self-pay | Admitting: Licensed Clinical Social Worker

## 2024-06-14 ENCOUNTER — Ambulatory Visit (INDEPENDENT_AMBULATORY_CARE_PROVIDER_SITE_OTHER): Admitting: Licensed Clinical Social Worker

## 2024-06-14 DIAGNOSIS — F330 Major depressive disorder, recurrent, mild: Secondary | ICD-10-CM | POA: Diagnosis not present

## 2024-06-14 NOTE — Progress Notes (Signed)
 Virtual Visit via Video Note  I connected with Jerry Thornton on 06/14/24 at  2:30 PM EDT by a video enabled telemedicine application and verified that I am speaking with the correct person using two identifiers.  Location: Patient: home Provider: home office   I discussed the limitations of evaluation and management by telemedicine and the availability of in person appointments. The patient expressed understanding and agreed to proceed.   I discussed the assessment and treatment plan with the patient. The patient was provided an opportunity to ask questions and all were answered. The patient agreed with the plan and demonstrated an understanding of the instructions.   The patient was advised to call back or seek an in-person evaluation if the symptoms worsen or if the condition fails to improve as anticipated.  I provided 45 minutes of non-face-to-face time during this encounter.   [name], LCSW   THERAPIST PROGRESS NOTE  Session Time: 2:30pm-3:15pm  Participation Level: Active  Behavioral Response: Well GroomedAlertEuthymic  Type of Therapy: Individual Therapy  Treatment Goals addressed:  Problem: OP Depression         Dates: Start:  12/27/23           Disciplines: Interdisciplinary, PROVIDER               Goal: LTG: Reduce frequency, intensity, and duration of depression symptoms so that daily functioning is improved     Dates: Start:  12/27/23    Expected End:  06/22/24       Disciplines: Interdisciplinary, PROVIDER                Goal: LTG: Increase coping skills to manage depression and improve ability to perform daily activities     Dates: Start:  12/27/23    Expected End:  06/22/24       Disciplines: Interdisciplinary, PROVIDER                Intervention: Work with Jerry to track symptoms, triggers, and/or skill use through a mood chart, diary card, or journal     Dates: Start:  12/27/23         ProgressTowards Goals:  Progressing  Interventions: CBT  Summary: Jerry Thornton is a 58 y.o. male who presents with MDD, recurrent, mild.   Suicidal/Homicidal: Nowithout intent/plan  Therapist Response: Jerry Thornton engaged well in individual virtual session with clincian. Clinician utilized CBT to process thoughts, feelings, and interactions. Clinician explored mood stability, medications, and coping. Clinician provided time and space for Jerry Thornton to share updates about adjustment and feedback at work, which has been more positive. Clinician also noted that ongoing head to toe body scanning has been helpful to maintain vigilance about his mental health and physical health. Clinician encouraged work-life balance by completing as much work that can be completed in his day and going home and doing things for himself and his family, rather than working into the night at the office. Jerry Thornton shared continuing to exercise with 2-3 [employer] classes and current training for a triathalon on weekends. Jerry Thornton shared that he feels very stable and he is comfortable on medications. He still feels a lower level of energy, but recognizes that he is quite active and this may be wearing him out too much.   Plan: Return again in 2 weeks.  Diagnosis: MDD (major depressive disorder), recurrent episode, mild (HCC)  Collaboration of Care: Psychiatrist AEB reviewed note from Dr. Curry  Patient/Guardian was advised Release of Information must be obtained prior to any  record release in order to collaborate their care with an outside provider. Patient/Guardian was advised if they have not already done so to contact the registration department to sign all necessary forms in order for us  to release information regarding their care.   Consent: Patient/Guardian gives verbal consent for treatment and assignment of benefits for services provided during this visit. Patient/Guardian expressed understanding and agreed to proceed.   Harlene SAUNDERS [place],  LCSW 06/14/2024

## 2024-06-28 ENCOUNTER — Encounter (HOSPITAL_COMMUNITY): Payer: Self-pay | Admitting: Licensed Clinical Social Worker

## 2024-06-28 ENCOUNTER — Ambulatory Visit (HOSPITAL_COMMUNITY): Admitting: Licensed Clinical Social Worker

## 2024-06-28 DIAGNOSIS — F330 Major depressive disorder, recurrent, mild: Secondary | ICD-10-CM

## 2024-06-28 NOTE — Progress Notes (Signed)
 Virtual Visit via Video Note  I connected with Jerry Thornton on 06/28/24 at  4:30 PM EDT by a video enabled telemedicine application and verified that I am speaking with the correct person using two identifiers.  Location: Patient: home Provider: home office   I discussed the limitations of evaluation and management by telemedicine and the availability of in person appointments. The patient expressed understanding and agreed to proceed.   I discussed the assessment and treatment plan with the patient. The patient was provided an opportunity to ask questions and all were answered. The patient agreed with the plan and demonstrated an understanding of the instructions.   The patient was advised to call back or seek an in-person evaluation if the symptoms worsen or if the condition fails to improve as anticipated.  I provided 45 minutes of non-face-to-face time during this encounter.   [name], LCSW   THERAPIST PROGRESS NOTE  Session Time: 4:30pm-5:15pm  Participation Level: Active  Behavioral Response: Well GroomedAlertEuthymic  Type of Therapy: Individual Therapy  Treatment Goals addressed:  Problem: OP Depression         Dates: Start:  12/27/23           Disciplines: Interdisciplinary, PROVIDER               Goal: LTG: Reduce frequency, intensity, and duration of depression symptoms so that daily functioning is improved     Dates: Start:  12/27/23    Expected End:  06/22/24       Disciplines: Interdisciplinary, PROVIDER                Goal: LTG: Increase coping skills to manage depression and improve ability to perform daily activities     Dates: Start:  12/27/23    Expected End:  06/22/24       Disciplines: Interdisciplinary, PROVIDER                Intervention: Work with Jerry to track symptoms, triggers, and/or skill use through a mood chart, diary card, or journal     Dates: Start:  12/27/23         ProgressTowards Goals:  Progressing  Interventions: CBT  Summary: Jerry Thornton is a 58 y.o. male who presents with MDD, recurrent, mild.   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 Clinician utilized CBT to process mood, interactions, and behaviors. Clinician discussed updates in activities and noted increased involvement and interest in involvement with community organizations. 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 Clinician identified that this behavior of planning for the future and wanting to be involved in the community is a good indicator that there is no SI or any thoughts about not being here in the future. Clinician processed the feelings of being important and helpful, as well as the secondary gain of getting to know people in the community and making friends. Clinician identified stable and good mood, increased productivity at work, and positive relationships with family and friends.   Plan: Return again in 2 weeks.  Diagnosis: MDD (major depressive disorder), recurrent episode, mild (HCC)  Collaboration of Care: Patient refused AEB none required  Patient/Guardian was advised Release of Information must be obtained prior to any record release in order to collaborate their care with an outside provider. Patient/Guardian was advised if they have not already done so to contact the registration department  to sign all necessary forms in order for us  to release information regarding their care.   Consent: Patient/Guardian gives verbal consent for treatment and assignment of benefits for services provided during this visit. Patient/Guardian expressed understanding and agreed to proceed.   [name], LCSW 06/28/2024

## 2024-07-12 ENCOUNTER — Ambulatory Visit (HOSPITAL_COMMUNITY): Admitting: Licensed Clinical Social Worker

## 2024-07-12 DIAGNOSIS — F330 Major depressive disorder, recurrent, mild: Secondary | ICD-10-CM

## 2024-07-15 ENCOUNTER — Encounter (HOSPITAL_COMMUNITY): Payer: Self-pay | Admitting: Licensed Clinical Social Worker

## 2024-07-15 NOTE — Progress Notes (Signed)
 Virtual Visit via Video Note  I connected with Jerry Thornton on 07/12/24 at  4:30 PM EDT by a video enabled telemedicine application and verified that I am speaking with the correct person using two identifiers.  Location: Patient: home Provider: home office   I discussed the limitations of evaluation and management by telemedicine and the availability of in person appointments. The patient expressed understanding and agreed to proceed.   I discussed the assessment and treatment plan with the patient. The patient was provided an opportunity to ask questions and all were answered. The patient agreed with the plan and demonstrated an understanding of the instructions.   The patient was advised to call back or seek an in-person evaluation if the symptoms worsen or if the condition fails to improve as anticipated.  I provided 45 minutes of non-face-to-face time during this encounter.   Jerry JONELLE Rosser, LCSW   THERAPIST PROGRESS NOTE  Session Time: 4:30pm-5:15pm  Participation Level: Active  Behavioral Response: Well GroomedAlertEuthymic  Type of Therapy: Individual Therapy  Treatment Goals addressed:  Problem: OP Depression         Dates: Start:  12/27/23           Disciplines: Interdisciplinary, PROVIDER               Goal: LTG: Reduce frequency, intensity, and duration of depression symptoms so that daily functioning is improved     Dates: Start:  12/27/23    Expected End:  06/22/24       Disciplines: Interdisciplinary, PROVIDER                Goal: LTG: Increase coping skills to manage depression and improve ability to perform daily activities     Dates: Start:  12/27/23    Expected End:  06/22/24       Disciplines: Interdisciplinary, PROVIDER                Intervention: Work with Jerry to track symptoms, triggers, and/or skill use through a mood chart, diary card, or journal     Dates: Start:  12/27/23         ProgressTowards Goals:  Progressing  Interventions: CBT  Summary: Jerry Thornton is a 58 y.o. male who presents with MDD, recurrent, mild.   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 Clinician utilized CBT to process thoughts, feelings, and interactions. Clinician explored mood and productivity at work. Clinician processed challenges and improvements since last session. Clinician noted improvement in overall attitude about work, life, and plans for the future. Clinician explored coping skills that have been helpful such as deep breathing, taking a walk, getting some fresh air, and maintaining his movement throughout the week. Clinician encouraged socialization with people in [place] to become a part of his community. Clinician also processed continuing visits with sister.   Plan: Return again in 2 weeks.  Diagnosis: MDD (major depressive disorder), recurrent episode, mild  Collaboration of Care: Patient refused AEB none required  Patient/Guardian was advised Release of Information must be obtained prior to any record release in order to collaborate their care with an outside provider. Patient/Guardian was advised if they have not already done so to contact the registration department to sign all necessary forms in order for us  to release information regarding their care.   Consent: Patient/Guardian gives verbal consent for treatment and assignment of benefits for services provided during this visit. Patient/Guardian expressed understanding and agreed to proceed.  Jerry SAUNDERS [place], LCSW 07/15/2024

## 2024-08-02 ENCOUNTER — Ambulatory Visit (INDEPENDENT_AMBULATORY_CARE_PROVIDER_SITE_OTHER): Admitting: Licensed Clinical Social Worker

## 2024-08-02 DIAGNOSIS — F330 Major depressive disorder, recurrent, mild: Secondary | ICD-10-CM | POA: Diagnosis not present

## 2024-08-17 ENCOUNTER — Encounter (HOSPITAL_COMMUNITY): Payer: Self-pay | Admitting: Licensed Clinical Social Worker

## 2024-08-17 NOTE — Progress Notes (Signed)
 Virtual Visit via Video Note  I connected with Jerry Thornton on 08/02/24 at  4:30 PM EDT by a video enabled telemedicine application and verified that I am speaking with the correct person using two identifiers.  Location: Patient: home Provider: home office   I discussed the limitations of evaluation and management by telemedicine and the availability of in person appointments. The patient expressed understanding and agreed to proceed.   I discussed the assessment and treatment plan with the patient. The patient was provided an opportunity to ask questions and all were answered. The patient agreed with the plan and demonstrated an understanding of the instructions.   The patient was advised to call back or seek an in-person evaluation if the symptoms worsen or if the condition fails to improve as anticipated.  I provided 45 minutes of non-face-to-face time during this encounter.   Jerry JONELLE Rosser, LCSW   THERAPIST PROGRESS NOTE  Session Time: 4:30-5:15pm  Participation Level: Active  Behavioral Response: Well GroomedAlertEuthymic  Type of Therapy: Individual Therapy  Treatment Goals addressed:  Problem: OP Depression         Dates: Start:  12/27/23           Disciplines: Interdisciplinary, PROVIDER               Goal: LTG: Reduce frequency, intensity, and duration of depression symptoms so that daily functioning is improved     Dates: Start:  12/27/23    Expected End:  06/22/24       Disciplines: Interdisciplinary, PROVIDER                Goal: LTG: Increase coping skills to manage depression and improve ability to perform daily activities     Dates: Start:  12/27/23    Expected End:  06/22/24       Disciplines: Interdisciplinary, PROVIDER                Intervention: Work with Jerry to track symptoms, triggers, and/or skill use through a mood chart, diary card, or journal     Dates: Start:  12/27/23          ProgressTowards Goals:  Progressing  Interventions: CBT  Summary: Jerry Thornton is a 58 y.o. male who presents with MDD, recurrent, mild.   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 Clinician utilized CBT to process thoughts feelings, and behaviors. Clinician processed relationships with family and friends. Clinician explored coping skills needed for work and use of relaxation and medication. Clinician discussed improvement in overall mood and reports not having to think about my mental health.   Plan: Return again in 2-4 weeks.  Diagnosis: MDD (major depressive disorder), recurrent episode, mild  Collaboration of Care: Patient refused AEB none required  Patient/Guardian was advised Release of Information must be obtained prior to any record release in order to collaborate their care with an outside provider. Patient/Guardian was advised if they have not already done so to contact the registration department to sign all necessary forms in order for us  to release information regarding their care.   Consent: Patient/Guardian gives verbal consent for treatment and assignment of benefits for services provided during this visit. Patient/Guardian expressed understanding and agreed to proceed.   Jerry JONELLE [place], LCSW 08/17/2024

## 2024-08-23 ENCOUNTER — Ambulatory Visit (INDEPENDENT_AMBULATORY_CARE_PROVIDER_SITE_OTHER): Admitting: Licensed Clinical Social Worker

## 2024-08-23 DIAGNOSIS — F330 Major depressive disorder, recurrent, mild: Secondary | ICD-10-CM

## 2024-08-24 ENCOUNTER — Encounter (HOSPITAL_COMMUNITY): Payer: Self-pay | Admitting: Licensed Clinical Social Worker

## 2024-08-24 NOTE — Progress Notes (Signed)
 Virtual Visit via Video Note  I connected with Jerry Thornton on 08/23/24 at  4:30 PM EST by a video enabled telemedicine application and verified that I am speaking with the correct person using two identifiers.  Location: Patient: home in [place] Provider: home office   I discussed the limitations of evaluation and management by telemedicine and the availability of in person appointments. The patient expressed understanding and agreed to proceed.   I discussed the assessment and treatment plan with the patient. The patient was provided an opportunity to ask questions and all were answered. The patient agreed with the plan and demonstrated an understanding of the instructions.   The patient was advised to call back or seek an in-person evaluation if the symptoms worsen or if the condition fails to improve as anticipated.  I provided 55 minutes of non-face-to-face time during this encounter.   [name], LCSW   THERAPIST PROGRESS NOTE  Session Time: 4:30pm-5:25pm  Participation Level: Active  Behavioral Response: Well GroomedAlertEuthymic  Type of Therapy: Individual Therapy  Treatment Goals addressed:  Problem: OP Depression         Dates: Start:  12/27/23           Disciplines: Interdisciplinary, PROVIDER               Goal: LTG: Reduce frequency, intensity, and duration of depression symptoms so that daily functioning is improved     Dates: Start:  12/27/23    Expected End:  06/22/24       Disciplines: Interdisciplinary, PROVIDER                Goal: LTG: Increase coping skills to manage depression and improve ability to perform daily activities     Dates: Start:  12/27/23    Expected End:  06/22/24       Disciplines: Interdisciplinary, PROVIDER                Intervention: Work with Jerry to track symptoms, triggers, and/or skill use through a mood chart, diary card, or journal     Dates: Start:  12/27/23         ProgressTowards Goals:  Progressing  Interventions: CBT  Summary: Jerry Thornton is a 58 y.o. male who presents with MDD, recurrent, mild.   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 Clinician utilized CBT to process thoughts, feelings, and interactions. Jerry Thornton shared many positive updates, including positive interactions at home and at work. Clinician explored updates with colleagues and challenges with some people at work. Jerry Thornton shared that colleagues from past job have now started working and shared similar perceptions of the vibe and attitude of the office. Jerry Thornton shared this has been very validating because he has felt this way since he started. Clinician explored coping skills for managing conflict. Clinician discussed the joy of not having to think about mental health and also the importance of not forgetting about his mental health.   Plan: Return again in 2 weeks.  Diagnosis: MDD (major depressive disorder), recurrent episode, mild  Collaboration of Care: Patient refused AEB none required. Mood stablized and doing well with therapy.  Patient/Guardian was advised Release of Information must be obtained prior to any record release in order to collaborate their care with an outside provider. Patient/Guardian was advised if they have not already done so to contact the registration department to sign all necessary forms in order for us  to release information regarding their care.  Consent: Patient/Guardian gives verbal consent for treatment and assignment of benefits for services provided during this visit. Patient/Guardian expressed understanding and agreed to proceed.   Harlene SAUNDERS [place], LCSW 08/24/2024

## 2024-09-20 ENCOUNTER — Ambulatory Visit (INDEPENDENT_AMBULATORY_CARE_PROVIDER_SITE_OTHER): Admitting: Licensed Clinical Social Worker

## 2024-09-20 DIAGNOSIS — F330 Major depressive disorder, recurrent, mild: Secondary | ICD-10-CM

## 2024-09-24 ENCOUNTER — Encounter (HOSPITAL_COMMUNITY): Payer: Self-pay | Admitting: Licensed Clinical Social Worker

## 2024-09-24 NOTE — Progress Notes (Signed)
 Virtual Visit via Video Note  I connected with Marsa Hills on 09/20/24 at  4:30 PM EST by a video enabled telemedicine application and verified that I am speaking with the correct person using two identifiers.  Location: Patient: home Provider: home office   I discussed the limitations of evaluation and management by telemedicine and the availability of in person appointments. The patient expressed understanding and agreed to proceed.   I discussed the assessment and treatment plan with the patient. The patient was provided an opportunity to ask questions and all were answered. The patient agreed with the plan and demonstrated an understanding of the instructions.   The patient was advised to call back or seek an in-person evaluation if the symptoms worsen or if the condition fails to improve as anticipated.  I provided 45 minutes of non-face-to-face time during this encounter.   [name], LCSW   THERAPIST PROGRESS NOTE  Session Time: 4:30pm-5:15pm  Participation Level: Active  Behavioral Response: Well GroomedAlertEuthymic  Type of Therapy: Individual Therapy  Treatment Goals addressed:  Problem: OP Depression         Dates: Start:  12/27/23           Disciplines: Interdisciplinary, PROVIDER               Goal: LTG: Reduce frequency, intensity, and duration of depression symptoms so that daily functioning is improved     Dates: Start:  12/27/23    Expected End:  06/22/24       Disciplines: Interdisciplinary, PROVIDER                Goal: LTG: Increase coping skills to manage depression and improve ability to perform daily activities     Dates: Start:  12/27/23    Expected End:  06/22/24       Disciplines: Interdisciplinary, PROVIDER                Intervention: Work with Marsa to track symptoms, triggers, and/or skill use through a mood chart, diary card, or journal     Dates: Start:  12/27/23          ProgressTowards Goals:  Progressing  Interventions: CBT  Summary: Garlin Batdorf is a 58 y.o. male who presents with MDD, recurrent, mild.   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 Clinician utilized CBT to process thoughts, feelings, and behaviors. Clinician explored updates with mood stability, coping skills, and social interactions. Clinician provided time and space for [name] to share updates about work and the reality of the challenges he was having. Clinician reflected that his perceptions that at first seemed paranoid, were actually true. Clinician noted the importance of fact checking and then deciding how to process and move forward. Clinician identified [name]'s plan for the next several years. Clinician also noted the possibility of having a plan B, in case there is a change in his current job situation.  [name] shared overall things are going well and he has been managing well with current medications.   Plan: Return again in 2 weeks.  Diagnosis: MDD (major depressive disorder), recurrent episode, mild  Collaboration of Care: Patient refused AEB none required  Patient/Guardian was advised Release of Information must be obtained prior to any record release in order to collaborate their care with an outside provider. Patient/Guardian was advised if they have not already done so to contact the registration department to sign all necessary forms in order for us   to release information regarding their care.   Consent: Patient/Guardian gives verbal consent for treatment and assignment of benefits for services provided during this visit. Patient/Guardian expressed understanding and agreed to proceed.   Harlene SAUNDERS [place], LCSW 09/24/2024

## 2024-10-02 ENCOUNTER — Telehealth (HOSPITAL_COMMUNITY): Admitting: Psychiatry

## 2024-10-02 ENCOUNTER — Encounter (HOSPITAL_COMMUNITY): Payer: Self-pay | Admitting: Psychiatry

## 2024-10-02 DIAGNOSIS — F419 Anxiety disorder, unspecified: Secondary | ICD-10-CM

## 2024-10-02 DIAGNOSIS — F323 Major depressive disorder, single episode, severe with psychotic features: Secondary | ICD-10-CM

## 2024-10-02 MED ORDER — ESCITALOPRAM OXALATE 10 MG PO TABS: 10.0000 mg | ORAL_TABLET | Freq: Every day | ORAL | 1 refills | Status: DC

## 2024-10-02 NOTE — Progress Notes (Signed)
 [facility] MD Virtual Progress Note   Patient Location: Work Provider Location: Home Office  I connect with patient by video and verified that I am speaking with correct person by using two identifiers. I discussed the limitations of evaluation and management by telemedicine and the availability of in person appointments. I also discussed with the patient that there may be a patient responsible charge related to this service. The patient expressed understanding and agreed to proceed.  Jerry Thornton [id number] 58 y.o.  10/02/2024 10:46 AM  History of Present Illness:  Patient is evaluated by video session.  He is doing very well on Lexapro  10 mg.  He denies any crying spells or any feeling of hopelessness or worthlessness.  He is working in person all day but requested to take half Friday virtual from home and like to come back [place] during his lunchtime so he can stay home on the weekends.  Patient job is in [place].  He reported work is going well and he does not have any struggle.  He denies any side effects of medication.  He has no tremor or shakes or any EPS.  He is seeing Harlene which is helping his coping skills.  Patient told his plan is to go to [place]  on Christmas holidays.  Patient like to keep the current medicine.  His appetite is okay.  He is trying to lose weight.  His energy level is good.  He denies any active or passive suicidal thoughts.  Past Psychiatric History: H/O depression, psychosis. Last inpatient at [facility] in January 2025.  Discharged on Trileptal  and Abilify .  Did IOP.  H/O inpatient in 2017 and in 1999 due to psychosis and depression. Given Wellbutrin, Depakote, Abilify , trazodone  and Ativan  but d/c due to side effects and weight gain. No history of suicidal attempt.  Lexapro  worked for a while until discontinued on his own.   Past Medical History:  Diagnosis Date   Allergy    Anxiety    Depression     Outpatient  Encounter Medications as of 10/02/2024  Medication Sig   escitalopram  (LEXAPRO ) 10 MG tablet Take 1 tablet (10 mg total) by mouth daily.   No facility-administered encounter medications on file as of 10/02/2024.    No results found for this or any previous visit (from the past 2160 hours).   Psychiatric Specialty Exam: Physical Exam  Review of Systems  Weight 215 lb (97.5 kg).There is no height or weight on file to calculate BMI.  General Appearance: Casual  Eye Contact:  Good  Speech:  Clear and Coherent and Normal Rate  Volume:  Normal  Mood:  Pleasant  Affect:  Appropriate  Thought Process:  Goal Directed  Orientation:  Full (Time, Place, and Person)  Thought Content:  Logical  Suicidal Thoughts:  No  Homicidal Thoughts:  No  Memory:  Immediate;   Good Recent;   Good Remote;   Good  Judgement:  Good  Insight:  Present  Psychomotor Activity:  Normal  Concentration:  Concentration: Good and Attention Span: Good  Recall:  Good  Fund of Knowledge:  Good  Language:  Good  Akathisia:  No  Handed:  Right  AIMS (if indicated):     Assets:  Communication Skills Desire for Improvement Physical Health Resilience Social Support Talents/Skills Transportation  ADL's:  Intact  Cognition:  WNL  Sleep: Good       06/05/2024   11:54 AM 11/22/2023    9:53 AM 01/06/2018  11:38 AM 11/15/2016    8:59 AM 06/24/2015   10:27 AM  Depression screen PHQ 2/9  Decreased Interest 0 1 0 3 0  Down, Depressed, Hopeless 0 1 0 3 0  PHQ - 2 Score 0 2 0 6 0  Altered sleeping  1  3   Tired, decreased energy  1  3   Change in appetite  1  1   Feeling bad or failure about yourself   2  3   Trouble concentrating  2  3   Moving slowly or fidgety/restless  2  3   Suicidal thoughts  2  3    PHQ-9 Score  13   25    Difficult doing work/chores  Very difficult  Extremely dIfficult      Data saved with a previous flowsheet row definition      06/05/2024   11:54 AM  GAD 7 : Generalized Anxiety  Score  Nervous, Anxious, on Edge 1  Control/stop worrying 0  Worry too much - different things 1  Trouble relaxing 0  Restless 0  Easily annoyed or irritable 0  Afraid - awful might happen 0  Total GAD 7 Score 2  Anxiety Difficulty Not difficult at all      Assessment/Plan: Anxiety - Plan: escitalopram  (LEXAPRO ) 10 MG tablet  MDD (major depressive disorder), single episode, severe with psychotic features (HCC) - Plan: escitalopram  (LEXAPRO ) 10 MG tablet  Patient is a 58 year old married employed man with major depressive disorder and anxiety.  Currently stable on Lexapro  10 mg.  So far no major concern or side effects.  Encouraged to continue therapy with Harlene.  Recommend to call back if is any question or any concern.  Follow-up in 6 months.  Follow Up Instructions:     I discussed the assessment and treatment plan with the patient. The patient was provided an opportunity to ask questions and all were answered. The patient agreed with the plan and demonstrated an understanding of the instructions.   The patient was advised to call back or seek an in-person evaluation if the symptoms worsen or if the condition fails to improve as anticipated.    Collaboration of Care: Other provider involved in patient's care AEB notes are available in epic to review  Patient/Guardian was advised Release of Information must be obtained prior to any record release in order to collaborate their care with an outside provider. Patient/Guardian was advised if they have not already done so to contact the registration department to sign all necessary forms in order for us  to release information regarding their care.   Consent: Patient/Guardian gives verbal consent for treatment and assignment of benefits for services provided during this visit. Patient/Guardian expressed understanding and agreed to proceed.     Total encounter time 15 minutes which includes face-to-face time, chart reviewed, care  coordination, order entry and documentation during this encounter.   Note: This document was prepared by [employer] voice dictation technology and any errors that results from this process are unintentional.    Leni ONEIDA Client, MD 10/02/2024

## 2024-10-25 ENCOUNTER — Ambulatory Visit (INDEPENDENT_AMBULATORY_CARE_PROVIDER_SITE_OTHER): Admitting: Licensed Clinical Social Worker

## 2024-10-25 DIAGNOSIS — F331 Major depressive disorder, recurrent, moderate: Secondary | ICD-10-CM | POA: Diagnosis not present

## 2024-11-08 ENCOUNTER — Ambulatory Visit (HOSPITAL_COMMUNITY): Admitting: Licensed Clinical Social Worker

## 2024-11-08 ENCOUNTER — Encounter (HOSPITAL_COMMUNITY): Payer: Self-pay | Admitting: Licensed Clinical Social Worker

## 2024-11-08 ENCOUNTER — Encounter (HOSPITAL_COMMUNITY): Payer: Self-pay

## 2024-11-08 DIAGNOSIS — F330 Major depressive disorder, recurrent, mild: Secondary | ICD-10-CM | POA: Diagnosis not present

## 2024-11-08 NOTE — Progress Notes (Signed)
 Virtual Visit via Video Note  I connected with Jerry Thornton on 11/08/24 at  4:30 PM EST by a video enabled telemedicine application and verified that I am speaking with the correct person using two identifiers.  Location: Patient: home Provider: home office   I discussed the limitations of evaluation and management by telemedicine and the availability of in person appointments. The patient expressed understanding and agreed to proceed.  I discussed the assessment and treatment plan with the patient. The patient was provided an opportunity to ask questions and all were answered. The patient agreed with the plan and demonstrated an understanding of the instructions.   The patient was advised to call back or seek an in-person evaluation if the symptoms worsen or if the condition fails to improve as anticipated.  I provided 60 minutes of non-face-to-face time during this encounter.   [name], LCSW   THERAPIST PROGRESS NOTE  Session Time: 4:30pm-5:30pm  Participation Level: Active  Behavioral Response: CasualAlertEuthymic and Irritable  Type of Therapy: Individual Therapy  Treatment Goals addressed:  Active     OP Depression     LTG: Reduce frequency, intensity, and duration of depression symptoms so that daily functioning is improved (Progressing)     Start:  12/27/23    Expected End:  11/07/25         LTG: Increase coping skills to manage depression and improve ability to perform daily activities (Progressing)     Start:  12/27/23    Expected End:  11/07/25         Work with Marsa to track symptoms, triggers, and/or skill use through a mood chart, diary card, or journal     Start:  12/27/23            ProgressTowards Goals: Progressing  Interventions: CBT and Motivational Interviewing  Summary: Jerry Thornton is a 59 y.o. male who presents with MDD, recurrent, mild.   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 Clinician utilized CBT and MI OARS to reflect and summarize thoughts, feelings, and interactions. Clinician explored updates in life and processed current status with work. Clinician processed thoughts and feelings about the company's decision to eliminate his position, and the plans about next steps. Clinician utilized MI to explore options for next steps. Clinician also noted that using the CBT Worst Case/Best Case Scenario as a way to prepare himself for the next steps.  Clinician reflected challenges with his own personal ethics vs the ethics of his company. Clinician also noted that this has been difficult to navigate without taking it personally or allowing his feelings or his ego to be hurt.  Jerry Thornton shared that he is feeling okay. He is moving through the stages of grief and working up to acceptance for what is possible in the future. He also shared that in some ways this is going to be a positive opportunity.    Plan: Return again in 1-2 weeks.  Diagnosis: MDD (major depressive disorder), recurrent episode, mild  Collaboration of Care: Psychiatrist AEB Jerry Thornton has appt with Dr. Curry on 1/23. He shared that he has increased his Lexapro  to 20mg  on his own in order to cope with this job shift. He will discuss this further with Dr. Curry tomorrow.  Patient/Guardian was advised Release of Information must be obtained prior to any record release in order to collaborate their care with an outside provider. Patient/Guardian was advised if they have not already done so to  contact the registration department to sign all necessary forms in order for us  to release information regarding their care.   Consent: Patient/Guardian gives verbal consent for treatment and assignment of benefits for services provided during this visit. Patient/Guardian expressed understanding and agreed to proceed.   Harlene SAUNDERS [place], LCSW 11/08/2024

## 2024-11-09 ENCOUNTER — Telehealth (HOSPITAL_COMMUNITY): Admitting: Psychiatry

## 2024-11-09 ENCOUNTER — Encounter (HOSPITAL_COMMUNITY): Payer: Self-pay | Admitting: Psychiatry

## 2024-11-09 VITALS — Wt 215.0 lb

## 2024-11-09 DIAGNOSIS — F419 Anxiety disorder, unspecified: Secondary | ICD-10-CM

## 2024-11-09 DIAGNOSIS — F323 Major depressive disorder, single episode, severe with psychotic features: Secondary | ICD-10-CM | POA: Diagnosis not present

## 2024-11-09 MED ORDER — ESCITALOPRAM OXALATE 20 MG PO TABS: 20.0000 mg | ORAL_TABLET | Freq: Every day | ORAL | 0 refills | Status: AC

## 2024-11-09 NOTE — Progress Notes (Signed)
 " [facility] MD Virtual Progress Note   Patient Location: Home Provider Location: Home Office  I connect with patient by video and verified that I am speaking with correct person by using two identifiers. I discussed the limitations of evaluation and management by telemedicine and the availability of in person appointments. I also discussed with the patient that there may be a patient responsible charge related to this service. The patient expressed understanding and agreed to proceed.  Stephenson Cichy [id number] 59 y.o.  11/09/2024 11:30 AM  History of Present Illness:  Patient is evaluated by video session.  He requested an earlier appointment because change in his job circumstances.  Patient told his job is eliminated but company is giving 1 year to work and then he can look around if any other position available in the company.  He admitted that news has make him more anxious and he is not sleeping as good.  He admitted ruminative thoughts and thinking too much but no agitation, suicidal thoughts.  He decided to get up his Lexapro  and start taking 20 mg recently.  So far tolerating medication and reported no side effects.  He is also working from home so he does not have to travel.  He had a good support from his wife and family.  He also seeing Harlene for therapy.  Patient had a good vacation and Christmas and he went to [place] .  He like to focus on his mental health and does not want to slip back.  He is taking higher dose because he noticed having a lot of procrastination and ruminative thoughts.  Denies any paranoia, hallucination, voices.  He reported appetite is okay.  He may have gained few pounds but now he like to focus on his general health and healthy lifestyle.  Denies any tremors shakes or any concern.  He had taken higher dose in the past without any side effects.  Past Psychiatric History: H/O depression, psychosis. Last inpatient at [facility] in January 2025.   Discharged on Trileptal  and Abilify .  Did IOP.  H/O inpatient in 2017 and in 1999 due to psychosis and depression. Given Wellbutrin, Depakote, Abilify , trazodone  and Ativan  but d/c due to side effects and weight gain. No history of suicidal attempt.  Lexapro  worked for a while until discontinued on his own.   Past Medical History:  Diagnosis Date   Allergy    Anxiety    Depression     Outpatient Encounter Medications as of 11/09/2024  Medication Sig   escitalopram  (LEXAPRO ) 10 MG tablet Take 1 tablet (10 mg total) by mouth daily.   No facility-administered encounter medications on file as of 11/09/2024.    No results found for this or any previous visit (from the past 2160 hours).   Psychiatric Specialty Exam: Physical Exam  Review of Systems  Weight 215 lb (97.5 kg).There is no height or weight on file to calculate BMI.  General Appearance: Casual  Eye Contact:  Good  Speech:  Clear and Coherent and Normal Rate  Volume:  Normal  Mood:  Anxious  Affect:  Appropriate  Thought Process:  Goal Directed  Orientation:  Full (Time, Place, and Person)  Thought Content:  Logical and sometimes ruminative thoughts  Suicidal Thoughts:  No  Homicidal Thoughts:  No  Memory:  Immediate;   Good Recent;   Good Remote;   Good  Judgement:  Good  Insight:  Present  Psychomotor Activity:  Normal  Concentration:  Concentration: Good and  Attention Span: Good  Recall:  Good  Fund of Knowledge:  Good  Language:  Good  Akathisia:  No  Handed:  Right  AIMS (if indicated):     Assets:  Communication Skills Desire for Improvement Physical Health Resilience Social Support Talents/Skills Transportation  ADL's:  Intact  Cognition:  WNL  Sleep: Fair        06/05/2024   11:54 AM 11/22/2023    9:53 AM 01/06/2018   11:38 AM 11/15/2016    8:59 AM 06/24/2015   10:27 AM  Depression screen PHQ 2/9  Decreased Interest 0 1 0 3 0  Down, Depressed, Hopeless 0 1 0 3 0  PHQ - 2 Score 0 2 0 6 0  Altered  sleeping  1  3   Tired, decreased energy  1  3   Change in appetite  1  1   Feeling bad or failure about yourself   2  3   Trouble concentrating  2  3   Moving slowly or fidgety/restless  2  3   Suicidal thoughts  2  3    PHQ-9 Score  13   25    Difficult doing work/chores  Very difficult  Extremely dIfficult      Data saved with a previous flowsheet row definition      06/05/2024   11:54 AM  GAD 7 : Generalized Anxiety Score  Nervous, Anxious, on Edge 1   Control/stop worrying 0   Worry too much - different things 1   Trouble relaxing 0   Restless 0   Easily annoyed or irritable 0   Afraid - awful might happen 0   Total GAD 7 Score 2  Anxiety Difficulty Not difficult at all     Data saved with a previous flowsheet row definition      Assessment/Plan: MDD (major depressive disorder), single episode, severe with psychotic features (HCC) - Plan: escitalopram  (LEXAPRO ) 20 MG tablet  Anxiety - Plan: escitalopram  (LEXAPRO ) 20 MG tablet  Patient is a 59 year old married man with major depressive disorder and anxiety requested earlier appointment.  Discussed work situation and anxiety symptoms.  Agree to go up on Lexapro  20 mg.  Will consider lowering it down in the future once more clarity about his future job.  Encouraged to continue therapy with Harlene.  Patient had tried higher dose of Lexapro  without any side effects.  Will follow-up in 3 months unless patient need a sooner appointment.  He is working from home for now and he had a good support from his family.  Follow Up Instructions:     I discussed the assessment and treatment plan with the patient. The patient was provided an opportunity to ask questions and all were answered. The patient agreed with the plan and demonstrated an understanding of the instructions.   The patient was advised to call back or seek an in-person evaluation if the symptoms worsen or if the condition fails to improve as  anticipated.    Collaboration of Care: Other provider involved in patient's care AEB notes are available in epic to review  Patient/Guardian was advised Release of Information must be obtained prior to any record release in order to collaborate their care with an outside provider. Patient/Guardian was advised if they have not already done so to contact the registration department to sign all necessary forms in order for us  to release information regarding their care.   Consent: Patient/Guardian gives verbal consent for treatment and assignment of benefits  for services provided during this visit. Patient/Guardian expressed understanding and agreed to proceed.     Total encounter time 19 minutes which includes face-to-face time, chart reviewed, care coordination, order entry and documentation during this encounter.   Note: This document was prepared by [employer] voice dictation technology and any errors that results from this process are unintentional.    Leni ONEIDA Client, MD 11/09/2024   "

## 2024-11-12 ENCOUNTER — Encounter (HOSPITAL_COMMUNITY): Payer: Self-pay | Admitting: Licensed Clinical Social Worker

## 2024-11-12 NOTE — Progress Notes (Signed)
 Virtual Visit via Video Note  I connected with Merwyn Hodapp on 10/25/24 at  4:30 PM EST by a video enabled telemedicine application and verified that I am speaking with the correct person using two identifiers.  Location: Patient: home Provider: home office   I discussed the limitations of evaluation and management by telemedicine and the availability of in person appointments. The patient expressed understanding and agreed to proceed.    I discussed the assessment and treatment plan with the patient. The patient was provided an opportunity to ask questions and all were answered. The patient agreed with the plan and demonstrated an understanding of the instructions.   The patient was advised to call back or seek an in-person evaluation if the symptoms worsen or if the condition fails to improve as anticipated.  I provided 55 minutes of non-face-to-face time during this encounter.   [name], LCSW   THERAPIST PROGRESS NOTE  Session Time: 4:30pm-5:25pm  Participation Level: Active  Behavioral Response: NeatAlertAnxious, Depressed, and Irritable  Type of Therapy: Individual Therapy  Treatment Goals addressed:  Active     OP Depression     LTG: Reduce frequency, intensity, and duration of depression symptoms so that daily functioning is improved (Progressing)     Start:  12/27/23    Expected End:  11/07/25         LTG: Increase coping skills to manage depression and improve ability to perform daily activities (Progressing)     Start:  12/27/23    Expected End:  11/07/25         Work with Marsa to track symptoms, triggers, and/or skill use through a mood chart, diary card, or journal     Start:  12/27/23            ProgressTowards Goals: Progressing  Interventions: CBT  Summary: Jaysion Ramseyer is a 59 y.o. male who presents with MDD, recurrent, moderate.   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 Clinician utilized CBT to process thoughts, feelings, and interactions. Clinician explored current concerns with work and noted ongoing paranoia about his position. Clinician utilized CBT what is the evidence intervention. Clinician also explored best and worst case scenarios. Clinician noted ability to prepare for the worst, and the likelihood that regardless, he will be okay.   Plan: Return again in 2 weeks.  Diagnosis: MDD (major depressive disorder), recurrent episode, moderate (HCC)  Collaboration of Care: Patient refused AEB none required  Patient/Guardian was advised Release of Information must be obtained prior to any record release in order to collaborate their care with an outside provider. Patient/Guardian was advised if they have not already done so to contact the registration department to sign all necessary forms in order for us  to release information regarding their care.   Consent: Patient/Guardian gives verbal consent for treatment and assignment of benefits for services provided during this visit. Patient/Guardian expressed understanding and agreed to proceed.   Harlene JONELLE Seymour, LCSW 11/12/2024

## 2024-11-14 ENCOUNTER — Encounter (HOSPITAL_COMMUNITY): Payer: Self-pay | Admitting: Licensed Clinical Social Worker

## 2024-11-14 ENCOUNTER — Ambulatory Visit (HOSPITAL_COMMUNITY): Admitting: Licensed Clinical Social Worker

## 2024-11-14 DIAGNOSIS — F330 Major depressive disorder, recurrent, mild: Secondary | ICD-10-CM | POA: Diagnosis not present

## 2024-11-14 NOTE — Progress Notes (Signed)
 Virtual Visit via Video Note  I connected with Jerry Thornton on 11/14/24 at  9:00 AM EST by a video enabled telemedicine application and verified that I am speaking with the correct person using two identifiers.  Location: Patient: home Provider: home office   I discussed the limitations of evaluation and management by telemedicine and the availability of in person appointments. The patient expressed understanding and agreed to proceed.    I discussed the assessment and treatment plan with the patient. The patient was provided an opportunity to ask questions and all were answered. The patient agreed with the plan and demonstrated an understanding of the instructions.   The patient was advised to call back or seek an in-person evaluation if the symptoms worsen or if the condition fails to improve as anticipated.  I provided 55 minutes of non-face-to-face time during this encounter.   [name], LCSW   THERAPIST PROGRESS NOTE  Session Time: 9:00am-9:55am  Participation Level: Active  Behavioral Response: NeatAlertEuthymic  Type of Therapy: Individual Therapy  Treatment Goals addressed:  Active     OP Depression     LTG: Reduce frequency, intensity, and duration of depression symptoms so that daily functioning is improved (Progressing)     Start:  12/27/23    Expected End:  11/07/25         LTG: Increase coping skills to manage depression and improve ability to perform daily activities (Progressing)     Start:  12/27/23    Expected End:  11/07/25         Work with Jerry to track symptoms, triggers, and/or skill use through a mood chart, diary card, or journal     Start:  12/27/23            ProgressTowards Goals: Progressing  Interventions: CBT and Solution Focused  Summary: Jerry Thornton is a 59 y.o. male who presents with MDD, recurrent, mild.   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 Clinician utilized Solution Focused therapy to process updates in work as well as options for the future. Clinician processed pros and cons of his options using CBT. 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 Clinician provided time and space for Jerry Thornton to share his feelings about what happened with his job and what he hopes to move toward. Clinician reflected the usefulness of med increase and verified that this was okay with Dr. Arfeen. Clinician identified the importance of maintaining consistency with this medication, as it is very well-suited for what Jerry Thornton needs for mood stability.   Plan: Return again in 1-2 weeks.  Diagnosis: MDD (major depressive disorder), recurrent episode, mild  Collaboration of Care: Psychiatrist AEB reviewed note from Dr. Curry  Patient/Guardian was advised Release of Information must be obtained prior to any record release in order to collaborate their care with an outside provider. Patient/Guardian was advised if they have not already done so to contact the registration department to sign all necessary forms in order for us  to release information regarding their care.   Consent: Patient/Guardian gives verbal consent for treatment and assignment of benefits for services provided during this visit. Patient/Guardian expressed understanding and agreed to proceed.   Harlene JONELLE [place], LCSW 11/14/2024

## 2024-11-22 ENCOUNTER — Ambulatory Visit (HOSPITAL_COMMUNITY): Admitting: Licensed Clinical Social Worker

## 2024-11-22 NOTE — Progress Notes (Unsigned)
" ° °  THERAPIST PROGRESS NOTE  Session Time: ***  Participation Level: {BHH PARTICIPATION LEVEL:22264}  Behavioral Response: {Appearance:22683}{BHH LEVEL OF CONSCIOUSNESS:22305}{BHH MOOD:22306}  Type of Therapy: {CHL AMB BH Type of Therapy:21022741}  Treatment Goals addressed: ***  ProgressTowards Goals: {Progress Towards Goals:21014066}  Interventions: {CHL AMB BH Type of Intervention:21022753}  Summary: Jerry Thornton is a 59 y.o. male who presents with ***.   Suicidal/Homicidal: {BHH YES OR NO:22294}{yes/no/with/without intent/plan:22693}  Therapist Response: ***  Plan: Return again in *** weeks.  Diagnosis: No diagnosis found.  Collaboration of Care: {BH OP Collaboration of Care:21014065}  Patient/Guardian was advised Release of Information must be obtained prior to any record release in order to collaborate their care with an outside provider. Patient/Guardian was advised if they have not already done so to contact the registration department to sign all necessary forms in order for us  to release information regarding their care.   Consent: Patient/Guardian gives verbal consent for treatment and assignment of benefits for services provided during this visit. Patient/Guardian expressed understanding and agreed to proceed.   Harlene SAUNDERS [name], LCSW 11/22/2024  "

## 2024-11-29 ENCOUNTER — Ambulatory Visit (HOSPITAL_COMMUNITY): Admitting: Licensed Clinical Social Worker

## 2024-12-06 ENCOUNTER — Ambulatory Visit (HOSPITAL_COMMUNITY): Admitting: Licensed Clinical Social Worker

## 2024-12-12 ENCOUNTER — Ambulatory Visit (HOSPITAL_COMMUNITY): Admitting: Licensed Clinical Social Worker

## 2025-04-02 ENCOUNTER — Telehealth (HOSPITAL_COMMUNITY): Admitting: Psychiatry
